# Patient Record
Sex: Female | Born: 1975 | Race: White | Hispanic: No | Smoking: Never smoker
Health system: Southern US, Community
[De-identification: ages and names within clinical notes are randomized; demographics above are authoritative.]

## PROBLEM LIST (undated history)

## (undated) DIAGNOSIS — N201 Calculus of ureter: Secondary | ICD-10-CM

## (undated) DIAGNOSIS — E785 Hyperlipidemia, unspecified: Secondary | ICD-10-CM

## (undated) DIAGNOSIS — R87629 Unspecified abnormal cytological findings in specimens from vagina: Secondary | ICD-10-CM

## (undated) DIAGNOSIS — G43909 Migraine, unspecified, not intractable, without status migrainosus: Secondary | ICD-10-CM

## (undated) DIAGNOSIS — I10 Essential (primary) hypertension: Secondary | ICD-10-CM

## (undated) HISTORY — DX: Hyperlipidemia, unspecified: E78.5

## (undated) HISTORY — DX: Migraine, unspecified, not intractable, without status migrainosus: G43.909

## (undated) HISTORY — DX: Essential (primary) hypertension: I10

## (undated) HISTORY — DX: Calculus of ureter: N20.1

## (undated) HISTORY — DX: Unspecified abnormal cytological findings in specimens from vagina: R87.629

---

## 2000-09-12 ENCOUNTER — Other Ambulatory Visit: Admission: RE | Admit: 2000-09-12 | Discharge: 2000-09-12 | Payer: Self-pay | Admitting: Family Medicine

## 2004-09-15 ENCOUNTER — Observation Stay: Payer: Self-pay | Admitting: Obstetrics and Gynecology

## 2004-09-21 ENCOUNTER — Inpatient Hospital Stay: Payer: Self-pay

## 2006-09-30 ENCOUNTER — Emergency Department: Payer: Self-pay | Admitting: Emergency Medicine

## 2007-04-21 ENCOUNTER — Ambulatory Visit: Payer: Self-pay | Admitting: Family Medicine

## 2007-12-17 ENCOUNTER — Ambulatory Visit: Payer: Self-pay

## 2008-06-01 ENCOUNTER — Ambulatory Visit: Payer: Self-pay | Admitting: General Practice

## 2008-06-03 ENCOUNTER — Ambulatory Visit: Payer: Self-pay | Admitting: Family Medicine

## 2008-07-06 ENCOUNTER — Encounter: Payer: Self-pay | Admitting: General Practice

## 2008-07-24 ENCOUNTER — Encounter: Payer: Self-pay | Admitting: General Practice

## 2008-09-08 ENCOUNTER — Other Ambulatory Visit: Payer: Self-pay | Admitting: Family Medicine

## 2009-03-26 HISTORY — PX: TONSILLECTOMY: SUR1361

## 2010-03-27 ENCOUNTER — Ambulatory Visit: Payer: Self-pay | Admitting: Internal Medicine

## 2010-05-04 ENCOUNTER — Ambulatory Visit: Payer: Self-pay | Admitting: Otolaryngology

## 2010-05-05 LAB — PATHOLOGY REPORT

## 2010-09-05 ENCOUNTER — Other Ambulatory Visit: Payer: Self-pay | Admitting: Family Medicine

## 2010-09-29 ENCOUNTER — Ambulatory Visit: Payer: Self-pay | Admitting: Cardiology

## 2012-04-03 ENCOUNTER — Ambulatory Visit: Payer: Self-pay

## 2014-02-16 LAB — LIPID PANEL
CHOLESTEROL: 237 mg/dL — AB (ref 0–200)
HDL: 57 mg/dL (ref 35–70)
LDL Cholesterol: 166 mg/dL
TRIGLYCERIDES: 71 mg/dL (ref 40–160)

## 2014-02-16 LAB — HEMOGLOBIN A1C: Hemoglobin A1C: 5.8

## 2014-02-16 LAB — TSH: TSH: 0.8 u[IU]/mL (ref <=5.90)

## 2014-04-28 ENCOUNTER — Ambulatory Visit: Payer: Self-pay | Admitting: Obstetrics and Gynecology

## 2014-05-24 ENCOUNTER — Encounter: Payer: Self-pay | Admitting: General Practice

## 2014-05-25 ENCOUNTER — Encounter
Admit: 2014-05-25 | Disposition: A | Discharge status: Home / Self Care | Payer: Self-pay | Attending: General Practice | Admitting: General Practice

## 2014-06-25 ENCOUNTER — Encounter
Admit: 2014-06-25 | Disposition: A | Discharge status: Home / Self Care | Payer: Self-pay | Attending: General Practice | Admitting: General Practice

## 2015-02-09 ENCOUNTER — Encounter: Payer: Self-pay | Admitting: *Deleted

## 2015-02-22 ENCOUNTER — Encounter: Payer: 59 | Admitting: Obstetrics and Gynecology

## 2015-03-22 ENCOUNTER — Encounter: Payer: Self-pay | Admitting: Obstetrics and Gynecology

## 2015-03-22 ENCOUNTER — Ambulatory Visit (INDEPENDENT_AMBULATORY_CARE_PROVIDER_SITE_OTHER): Payer: 59 | Admitting: Obstetrics and Gynecology

## 2015-03-22 VITALS — BP 140/80 | HR 74 | Ht 66.0 in | Wt 167.3 lb

## 2015-03-22 DIAGNOSIS — Z01419 Encounter for gynecological examination (general) (routine) without abnormal findings: Secondary | ICD-10-CM

## 2015-03-22 MED ORDER — NORETHIN ACE-ETH ESTRAD-FE 1.5-30 MG-MCG PO TABS: 1.0000 | ORAL_TABLET | Freq: Every day | ORAL | Status: DC

## 2015-03-22 NOTE — Patient Instructions (Signed)
  Place annual gynecologic exam patient instructions here.  Thank you for enrolling in [facility]. Please follow the instructions below to securely access your online medical record. MyChart allows you to send messages to your doctor, view your test results, manage appointments, and more.   How Do I Sign Up? 1. In your Internet browser, go to [employer] and enter [email or web address]. 2. Click on the Sign Up Now link in the Sign In box. You will see the New Member Sign Up page. 3. Enter your MyChart Access Code exactly as it appears below. You will not need to use this code after you've completed the sign-up process. If you do not sign up before the expiration date, you must request a new code.  MyChart Access Code: [email or web address] Expires: 04/23/2015 10:09 AM  4. Enter your Social Security Number ([id number]) and Date of Birth (mm/dd/yyyy) as indicated and click Submit. You will be taken to the next sign-up page. 5. Create a MyChart ID. This will be your MyChart login ID and cannot be changed, so think of one that is secure and easy to remember. 6. Create a MyChart password. You can change your password at any time. 7. Enter your Password Reset Question and Answer. This can be used at a later time if you forget your password.  8. Enter your e-mail address. You will receive e-mail notification when new information is available in [facility]. 9. Click Sign Up. You can now view your medical record.   Additional Information Remember, MyChart is NOT to be used for urgent needs. For medical emergencies, dial 911.

## 2015-03-22 NOTE — Progress Notes (Signed)
  Subjective:     Anna Kim is a 39 y.o. female and is here for a comprehensive physical exam. The patient reports no problems.  Social History   Social History  . Marital Status: Divorced    Spouse Name: N/A  . Number of Children: N/A  . Years of Education: N/A   Occupational History  . Not on file.   Social History Main Topics  . Smoking status: Never Smoker   . Smokeless tobacco: Never Used  . Alcohol Use: Yes     Comment: occas  . Drug Use: No  . Sexual Activity: Not Currently   Other Topics Concern  . Not on file   Social History Narrative   Health Maintenance  Topic Date Due  . HIV Screening  02/09/1989  . TETANUS/TDAP  02/09/1993  . PAP SMEAR  02/10/1995  . INFLUENZA VACCINE  10/25/2014    The following portions of the patient's history were reviewed and updated as appropriate: allergies, current medications, past family history, past medical history, past social history, past surgical history and problem list.  Review of Systems A comprehensive review of systems was negative.   Objective:    General appearance: alert, cooperative and appears stated age Neck: no adenopathy, no carotid bruit, no JVD, supple, symmetrical, trachea midline and thyroid not enlarged, symmetric, no tenderness/mass/nodules Lungs: clear to auscultation bilaterally Breasts: normal appearance, no masses or tenderness Heart: regular rate and rhythm, S1, S2 normal, no murmur, click, rub or gallop Abdomen: soft, non-tender; bowel sounds normal; no masses,  no organomegaly Pelvic: cervix normal in appearance, external genitalia normal, no adnexal masses or tenderness, no cervical motion tenderness, rectovaginal septum normal, uterus normal size, shape, and consistency and vagina normal without discharge    Assessment:    Healthy female exam. OCP user; Overweight      Plan:  Labs obtained and MMG ordered.   See After Visit Summary for Counseling Recommendations

## 2015-03-23 ENCOUNTER — Other Ambulatory Visit: Payer: Self-pay | Admitting: Obstetrics and Gynecology

## 2015-03-23 DIAGNOSIS — E559 Vitamin D deficiency, unspecified: Secondary | ICD-10-CM

## 2015-03-23 LAB — COMPREHENSIVE METABOLIC PANEL
ALK PHOS: 69 IU/L (ref 39–117)
ALT: 9 IU/L (ref 0–32)
AST: 10 IU/L (ref 0–40)
Albumin/Globulin Ratio: 1.5 (ref 1.1–2.5)
Albumin: 4.2 g/dL (ref 3.5–5.5)
BUN/Creatinine Ratio: 13 (ref 9–23)
BUN: 9 mg/dL (ref 6–24)
Bilirubin Total: 0.5 mg/dL (ref 0.0–1.2)
CHLORIDE: 100 mmol/L (ref 96–106)
CO2: 22 mmol/L (ref 18–29)
CREATININE: 0.72 mg/dL (ref 0.57–1.00)
Calcium: 8.6 mg/dL — ABNORMAL LOW (ref 8.7–10.2)
GFR calc Af Amer: 120 mL/min/{1.73_m2} (ref >59)
GFR calc non Af Amer: 104 mL/min/{1.73_m2} (ref >59)
Globulin, Total: 2.8 g/dL (ref 1.5–4.5)
Glucose: 90 mg/dL (ref 65–99)
Potassium: 3.7 mmol/L (ref 3.5–5.2)
Sodium: 139 mmol/L (ref 134–144)
Total Protein: 7 g/dL (ref 6.0–8.5)

## 2015-03-23 LAB — VITAMIN D 25 HYDROXY (VIT D DEFICIENCY, FRACTURES): Vit D, 25-Hydroxy: 20.6 ng/mL — ABNORMAL LOW (ref 30.0–100.0)

## 2015-03-23 MED ORDER — VITAMIN D (ERGOCALCIFEROL) 1.25 MG (50000 UNIT) PO CAPS: 50000.0000 [IU] | ORAL_CAPSULE | ORAL | Status: DC

## 2015-03-24 ENCOUNTER — Telehealth: Payer: Self-pay | Admitting: *Deleted

## 2015-03-24 NOTE — Telephone Encounter (Signed)
-----   Message from Joylene Igo, [place] sent at 03/23/2015  4:03 PM EST ----- Please let her know labs were normal except her vit D is too low, please mail info on vit D def, and let her know I sent in weekly supplement RX. I want to recheck her levels in 3 months.

## 2015-03-24 NOTE — Telephone Encounter (Signed)
Notified pt of results she voiced understanding

## 2015-05-17 ENCOUNTER — Other Ambulatory Visit: Payer: Self-pay | Admitting: Obstetrics and Gynecology

## 2015-05-17 ENCOUNTER — Ambulatory Visit
Admission: RE | Admit: 2015-05-17 | Discharge: 2015-05-17 | Disposition: A | Discharge status: Home / Self Care | Payer: 59 | Source: Ambulatory Visit | Attending: Obstetrics and Gynecology | Admitting: Obstetrics and Gynecology

## 2015-05-17 DIAGNOSIS — Z1231 Encounter for screening mammogram for malignant neoplasm of breast: Secondary | ICD-10-CM

## 2015-05-17 DIAGNOSIS — Z01419 Encounter for gynecological examination (general) (routine) without abnormal findings: Secondary | ICD-10-CM

## 2015-05-18 ENCOUNTER — Ambulatory Visit: Payer: Self-pay

## 2015-06-06 ENCOUNTER — Telehealth: Payer: Self-pay | Admitting: Obstetrics and Gynecology

## 2015-06-06 NOTE — Telephone Encounter (Signed)
PT HAS ALLERGIES AND WANTED TO KNOW IF YOU WOULD SEND IN A RX FOR HER FOR THE CLARITIN D 24 HOUR TO OUR [facility]OYEE [facility], SHE WENT TO THE EMPLOYEE CLINIC LAST YEAR AND THEY ONLY GAVE IT TO HR FOR 30 DAYS AND IT WAS GREAT, BUT IF SHE GETS IT OVER THE COUNTER SHE CAN ONLY GET IT FOR A WEEK AT A TIME SO SHE WAS WANTING TO SEE IF YOU CAN CALL IT IN SO SHE CAN GET A 30 DAY SUPPLY, SHE SAID SHE WOULD NEED TO TAKE IT FOR LIKE 3 MONTHS.

## 2015-06-07 ENCOUNTER — Other Ambulatory Visit: Payer: Self-pay | Admitting: Obstetrics and Gynecology

## 2015-06-07 MED ORDER — LORATADINE-PSEUDOEPHEDRINE ER 10-240 MG PO TB24: 1.0000 | ORAL_TABLET | Freq: Every day | ORAL | Status: DC

## 2015-06-07 NOTE — Telephone Encounter (Signed)
Left message rx sent to [facility]

## 2015-06-07 NOTE — Telephone Encounter (Signed)
Please let her know I sent it in today

## 2015-07-20 ENCOUNTER — Encounter: Payer: Self-pay | Admitting: Physician Assistant

## 2015-07-20 ENCOUNTER — Ambulatory Visit: Payer: Self-pay | Admitting: Physician Assistant

## 2015-07-20 VITALS — BP 120/90 | HR 80 | Temp 98.5°F

## 2015-07-20 DIAGNOSIS — J018 Other acute sinusitis: Secondary | ICD-10-CM

## 2015-07-20 MED ORDER — FLUCONAZOLE 150 MG PO TABS: 150.0000 mg | ORAL_TABLET | Freq: Once | ORAL | Status: DC

## 2015-07-20 MED ORDER — FLUTICASONE PROPIONATE 50 MCG/ACT NA SUSP: 2.0000 | Freq: Every day | NASAL | Status: DC

## 2015-07-20 MED ORDER — AMOXICILLIN 875 MG PO TABS: 875.0000 mg | ORAL_TABLET | Freq: Two times a day (BID) | ORAL | Status: DC

## 2015-07-20 NOTE — Progress Notes (Signed)
S: C/o runny nose and congestion for 3 days, no fever, chills, cp/sob, v/d; mucus is green and thick, cough is sporadic, c/o of facial and dental pain.   Using otc meds:   O: PE: perrl eomi, normocephalic, tms dull, nasal mucosa red and swollen, throat injected, neck supple no lymph, lungs c t a, cv rrr, neuro intact  A:  Acute sinusitis   P: amoxil, flonase, diflucan if needed; drink fluids, continue regular meds , use otc meds of choice, return if not improving in 5 days, return earlier if worsening

## 2015-08-08 DIAGNOSIS — S0500XA Injury of conjunctiva and corneal abrasion without foreign body, unspecified eye, initial encounter: Secondary | ICD-10-CM | POA: Diagnosis not present

## 2015-09-05 ENCOUNTER — Ambulatory Visit: Payer: Self-pay | Admitting: Physician Assistant

## 2015-09-05 ENCOUNTER — Telehealth: Payer: Self-pay | Admitting: Obstetrics and Gynecology

## 2015-09-05 ENCOUNTER — Encounter: Payer: Self-pay | Admitting: Physician Assistant

## 2015-09-05 ENCOUNTER — Encounter: Payer: Self-pay | Admitting: Obstetrics and Gynecology

## 2015-09-05 VITALS — BP 140/92 | HR 82 | Temp 98.6°F

## 2015-09-05 DIAGNOSIS — R300 Dysuria: Secondary | ICD-10-CM

## 2015-09-05 DIAGNOSIS — R102 Pelvic and perineal pain: Secondary | ICD-10-CM

## 2015-09-05 LAB — POCT URINALYSIS DIPSTICK
Bilirubin, UA: NEGATIVE
GLUCOSE UA: NEGATIVE
Ketones, UA: NEGATIVE
Leukocytes, UA: NEGATIVE
NITRITE UA: NEGATIVE
Protein, UA: NEGATIVE
Spec Grav, UA: 1.01
UROBILINOGEN UA: 0.2
pH, UA: 5.5

## 2015-09-05 NOTE — Progress Notes (Signed)
S: c/o pelvic pressure, not really painful, just makes her feel like she has to urinate, no fever/chills/v/d, no vag discharge or bleeding, states according to her pill pack she would probably be midcycle, sx for 1 days  O: vitals wnl, nad, abd soft nontender bs normal all 4 quads, n/v intact, ua 2+ blood  A: pelvic pressure/pain  P: f/u with gyn, does not exhibit sx of kidney stone

## 2015-09-05 NOTE — Telephone Encounter (Signed)
Pt is having a lot of pelvic pain and pressure. She went to [facility] clinic anf they did a urine test which came back neg but a lil blood in her urine. They suggested to her to come to Gyn to check her. She wants to see if she can get fitted in tomorrow before Mel leaves?

## 2015-09-05 NOTE — Telephone Encounter (Signed)
Pt to come in at 8:00 for pelvic pain.

## 2015-09-06 ENCOUNTER — Ambulatory Visit (INDEPENDENT_AMBULATORY_CARE_PROVIDER_SITE_OTHER): Payer: 59 | Admitting: Obstetrics and Gynecology

## 2015-09-06 ENCOUNTER — Encounter: Payer: Self-pay | Admitting: Obstetrics and Gynecology

## 2015-09-06 VITALS — BP 148/93 | HR 82 | Ht 66.0 in | Wt 172.4 lb

## 2015-09-06 DIAGNOSIS — R102 Pelvic and perineal pain: Secondary | ICD-10-CM

## 2015-09-06 LAB — POCT URINALYSIS DIPSTICK
BILIRUBIN UA: NEGATIVE
Glucose, UA: NEGATIVE
KETONES UA: NEGATIVE
Leukocytes, UA: NEGATIVE
Nitrite, UA: NEGATIVE
PH UA: 6
Spec Grav, UA: 1.02
Urobilinogen, UA: 0.2

## 2015-09-06 NOTE — Progress Notes (Signed)
Subjective:     Patient ID: Anna Kim, female   DOB: Jul 22, 1975, 40 y.o.   MRN: [id number]  HPI Reports sudden onset lower pelvic and bladder pain x 1 week, seen at outpatient clinic and was told not urinary and to follow up her. Not sexually active at this time. Denies any bowel changes or menstrual changes, and is taking OCPs as directed.  Review of Systems  Constitutional: Negative.   HENT: Negative.   Gastrointestinal: Negative.   Genitourinary: Positive for urgency and pelvic pain.  Musculoskeletal: Positive for back pain.  Skin: Negative.   Psychiatric/Behavioral: Negative.        Objective:   Physical Exam A&O x4  well groomed female Danley Danker Vitals:   09/06/15 0805  Height: 5\' 6"  (1.676 m)  Weight: 172 lb 6.4 oz (78.2 kg)  abdomen soft and non-tender Pelvic exam: normal external genitalia, vulva, vagina, cervix, uterus and adnexa. Urinalysis    Component Value Date/Time   BILIRUBINUR NEG 09/06/2015 0911   PROTEINUR TRACE 09/06/2015 0911   UROBILINOGEN 0.2 09/06/2015 0911   NITRITE NEG 09/06/2015 0911   LEUKOCYTESUR Negative 09/06/2015 0911        Assessment:     Pelvic pain of unknown etiology     Plan:     Urine sent for culture To continue current OCPs  counseled on causes of pelvic pain and to notify us if pain worsens.  RTC as needed.  Lorelle Gibbs, 'CNM

## 2015-09-07 LAB — URINE CULTURE

## 2015-12-27 ENCOUNTER — Telehealth: Payer: Self-pay | Admitting: Obstetrics and Gynecology

## 2015-12-27 NOTE — Telephone Encounter (Signed)
pls advise

## 2015-12-27 NOTE — Telephone Encounter (Signed)
PT CALLED AND MNS TOLD HER TO KEEP AN EYE ON HER BP AND SHE HAS CHECKED IT OVER THE PAST FEW DAYS AND IT  HAS BEEN HIGH, LAST NIGHT IT WAS 157/105, SHE WAS HAVING PRESSURE BEHIND HER HEAD, SHE WASN'T FEELING RIGHT THAT'S WHY SHE CHECKED IT, SHE CHECKED IT,  THIS MORNING AND IT WAS 132/97. SO SHE ISN'T SURE IF SHE NEEDS TO COME IN AND HAVE MNS CHECK HER OR COME IN FOR A BP CHECK. SHE CHECKED IT 10/1 AND IT 124/68. PT WOULD LIKE A CALL BACK. CAN LEAVE A MESSAGE IF SHE DOES NOT ANSWER.

## 2015-12-27 NOTE — Telephone Encounter (Signed)
Recommend she be evaluated at employee clinic or PCP.

## 2015-12-28 ENCOUNTER — Encounter: Payer: Self-pay | Admitting: Physician Assistant

## 2015-12-28 ENCOUNTER — Ambulatory Visit: Payer: Self-pay | Admitting: Physician Assistant

## 2015-12-28 VITALS — BP 120/80 | HR 76 | Temp 98.7°F

## 2015-12-28 DIAGNOSIS — R030 Elevated blood-pressure reading, without diagnosis of hypertension: Secondary | ICD-10-CM

## 2015-12-28 MED ORDER — HYDROCHLOROTHIAZIDE 25 MG PO TABS: 25.0000 mg | ORAL_TABLET | Freq: Every day | ORAL | 3 refills | Status: DC

## 2015-12-28 NOTE — Progress Notes (Signed)
S: c/o elevated bp, states she usually runs 90/over something, has been running higher, even had one as high as 150/107, she didn't feel well when she took that reading, has several readings that are much higher than her norm, family hx + htn; states has been taking claritin-d reagularly but recently stopped 1 week ago Tried to see a pcp but cannot be seen until end of October  O:vitals wnl, bp at 120/80, lungs c t a, cv rrr, no bruits noted at carotids  A: elevated bp  P: continue to take bp, if still decreasing since stopping claritin-d then do not need medication, if bp still going up then use hctz 25mg  qd until seen by a pcp; return if any issues or concerns

## 2016-01-20 ENCOUNTER — Ambulatory Visit (INDEPENDENT_AMBULATORY_CARE_PROVIDER_SITE_OTHER): Payer: 59 | Admitting: Family Medicine

## 2016-01-20 ENCOUNTER — Encounter: Payer: Self-pay | Admitting: Family Medicine

## 2016-01-20 VITALS — BP 124/88 | HR 71 | Temp 98.2°F | Ht 66.5 in | Wt 171.4 lb

## 2016-01-20 DIAGNOSIS — R7303 Prediabetes: Secondary | ICD-10-CM | POA: Diagnosis not present

## 2016-01-20 DIAGNOSIS — R030 Elevated blood-pressure reading, without diagnosis of hypertension: Secondary | ICD-10-CM

## 2016-01-20 DIAGNOSIS — E785 Hyperlipidemia, unspecified: Secondary | ICD-10-CM | POA: Diagnosis not present

## 2016-01-20 DIAGNOSIS — Z130 Encounter for screening for diseases of the blood and blood-forming organs and certain disorders involving the immune mechanism: Secondary | ICD-10-CM | POA: Diagnosis not present

## 2016-01-20 DIAGNOSIS — Z8679 Personal history of other diseases of the circulatory system: Secondary | ICD-10-CM

## 2016-01-20 HISTORY — DX: Personal history of other diseases of the circulatory system: Z86.79

## 2016-01-20 LAB — CBC
HEMATOCRIT: 36.1 % (ref 36.0–46.0)
HEMOGLOBIN: 12 g/dL (ref 12.0–15.0)
MCHC: 33.2 g/dL (ref 30.0–36.0)
MCV: 93 fl (ref 78.0–100.0)
Platelets: 174 10*3/uL (ref 150.0–400.0)
RBC: 3.88 Mil/uL (ref 3.87–5.11)
RDW: 12.7 % (ref 11.5–15.5)
WBC: 6.8 10*3/uL (ref 4.0–10.5)

## 2016-01-20 LAB — COMPREHENSIVE METABOLIC PANEL
ALBUMIN: 4 g/dL (ref 3.5–5.2)
ALK PHOS: 52 U/L (ref 39–117)
ALT: 8 U/L (ref 0–35)
AST: 12 U/L (ref 0–37)
BILIRUBIN TOTAL: 0.6 mg/dL (ref 0.2–1.2)
BUN: 7 mg/dL (ref 6–23)
CO2: 29 mEq/L (ref 19–32)
Calcium: 9.3 mg/dL (ref 8.4–10.5)
Chloride: 104 mEq/L (ref 96–112)
Creatinine, Ser: 0.85 mg/dL (ref 0.40–1.20)
GFR: 77.98 mL/min (ref >60.00)
Glucose, Bld: 100 mg/dL — ABNORMAL HIGH (ref 70–99)
POTASSIUM: 4.1 meq/L (ref 3.5–5.1)
SODIUM: 138 meq/L (ref 135–145)
TOTAL PROTEIN: 7.2 g/dL (ref 6.0–8.3)

## 2016-01-20 LAB — LIPID PANEL
CHOLESTEROL: 186 mg/dL (ref 0–200)
HDL: 45.3 mg/dL (ref >39.00)
LDL Cholesterol: 127 mg/dL — ABNORMAL HIGH (ref 0–99)
NonHDL: 140.91
Total CHOL/HDL Ratio: 4
Triglycerides: 69 mg/dL (ref 0.0–149.0)
VLDL: 13.8 mg/dL (ref 0.0–40.0)

## 2016-01-20 LAB — HEMOGLOBIN A1C: HEMOGLOBIN A1C: 5.7 % (ref 4.6–6.5)

## 2016-01-20 NOTE — Progress Notes (Signed)
Pre visit review using our clinic review tool, if applicable. No additional management support is needed unless otherwise documented below in the visit note. 

## 2016-01-20 NOTE — Assessment & Plan Note (Addendum)
New problem. Noted on review of the EMR. Diet and exercise. A1C today.

## 2016-01-20 NOTE — Assessment & Plan Note (Addendum)
New problem. Noted on review of the EMR. Lipid panel today.

## 2016-01-20 NOTE — Progress Notes (Signed)
Subjective:  Patient ID: Anna Kim, female    DOB: 02/08/1976  Age: 40 y.o. MRN: [id number]  CC: Establish care, Elevated BP  HPI Anna Kim is a 40 y.o. female presents to the clinic today to establish care. Primary concern is Elevated BP. Additional concerns are below.    Elevated BP  Patient has had elevated blood pressure on several occasions over the past few months.  It is occurred at her OB/GYN's office as well as the dentist.  Her pressures have been mildly elevated at home, particularly the diastolic.  She states that several of these incidences have occurred when she was taking decongestants or NSAIDs. She thought this may be the culprit for her elevated blood pressures.  However, since being off, her blood pressures continued to be on the high side.  She presents today as a new patient to discuss this.  No associated symptoms.  No other complaints or issues at this time.  PMH, Surgical Hx, Family Hx, Social History reviewed and updated as below.  Past Medical History:  Diagnosis Date  . Migraine   . Vaginal Pap smear, abnormal    Remote history of 1 abnormal pap   Past Surgical History:  Procedure Laterality Date  . TONSILLECTOMY  2011   Family History  Problem Relation Age of Onset  . Cancer Maternal Aunt     ovarian  . Hypertension Mother   . Hypertension Father    Social History  Substance Use Topics  . Smoking status: Never Smoker  . Smokeless tobacco: Never Used  . Alcohol use Yes     Comment: occas    Review of Systems  Cardiovascular:       Elevated BP.  All other systems reviewed and are negative.  Objective:   Today's Vitals: BP 124/88 (BP Location: Right Arm, Patient Position: Sitting, Cuff Size: Normal)   Pulse 71   Temp 98.2 F (36.8 C) (Oral)   Ht 5' 6.5" (1.689 m)   Wt 171 lb 6 oz (77.7 kg)   SpO2 99%   BMI 27.25 kg/m   Physical Exam  Constitutional: She is oriented to person, place, and time. She appears  well-developed and well-nourished. No distress.  HENT:  Head: Normocephalic and atraumatic.  Nose: Nose normal.  Mouth/Throat: Oropharynx is clear and moist. No oropharyngeal exudate.  Normal TM's bilaterally.   Eyes: Conjunctivae are normal. No scleral icterus.  Neck: Neck supple. No thyromegaly present.  Cardiovascular: Normal rate and regular rhythm.   No murmur heard. Pulmonary/Chest: Effort normal and breath sounds normal. She has no wheezes. She has no rales.  Abdominal: Soft. She exhibits no distension. There is no tenderness. There is no rebound and no guarding.  Musculoskeletal: Normal range of motion. She exhibits no edema.  Lymphadenopathy:    She has no cervical adenopathy.  Neurological: She is alert and oriented to person, place, and time.  Skin: Skin is warm and dry. No rash noted.  Psychiatric: She has a normal mood and affect.  Vitals reviewed.  Assessment & Plan:   Problem List Items Addressed This Visit    Prediabetes    New problem. Noted on review of the EMR. Diet and exercise. A1C today.       Relevant Orders   HgB A1c   Hyperlipidemia    New problem. Noted on review of the EMR. Lipid panel today.      Relevant Orders   Lipid Profile   Elevated BP without diagnosis of  hypertension    New problem. BP mildly elevated initially at 132/94. Repeat was 124/88. It is unclear at this time whether patient truly has hypertension or not. Obtaining laboratory studies today. Advised her to take her blood pressure daily at home when she is calm and after period of rest. I advised her to use an appropriate size cuff. No pharmacotherapy this time. Patient will follow-up in 2 weeks.  Advised low salt diet and exercise as well.       Relevant Orders   Comp Met (CMET)   TSH    Other Visit Diagnoses    Screening for deficiency anemia       Relevant Orders   CBC      Outpatient Encounter Prescriptions as of 01/20/2016  Medication Sig  .  norethindrone-ethinyl estradiol-iron (MICROGESTIN FE,GILDESS FE,LOESTRIN FE) 1.5-30 MG-MCG tablet Take 1 tablet by mouth daily.  . Vitamin D, Ergocalciferol, (DRISDOL) 50000 units CAPS capsule Take 1 capsule (50,000 Units total) by mouth every 7 (seven) days.  . [DISCONTINUED] fluticasone (FLONASE) 50 MCG/ACT nasal spray Place 2 sprays into both nostrils daily.  . [DISCONTINUED] hydrochlorothiazide (HYDRODIURIL) 25 MG tablet Take 1 tablet (25 mg total) by mouth daily. (Patient not taking: Reported on 01/20/2016)   No facility-administered encounter medications on file as of 01/20/2016.     Follow-up: Return in about 2 weeks (around 02/03/2016).  [facility]

## 2016-01-20 NOTE — Patient Instructions (Signed)
We will call with your lab results.  Follow up in 2 weeks.  Keep a log of your BP's.  Take care  Dr. Lacinda Axon

## 2016-01-20 NOTE — Assessment & Plan Note (Addendum)
New problem. BP mildly elevated initially at 132/94. Repeat was 124/88. It is unclear at this time whether patient truly has hypertension or not. Obtaining laboratory studies today. Advised her to take her blood pressure daily at home when she is calm and after period of rest. I advised her to use an appropriate size cuff. No pharmacotherapy this time. Patient will follow-up in 2 weeks.  Advised low salt diet and exercise as well.

## 2016-01-23 LAB — TSH: TSH: 0.68 u[IU]/mL (ref 0.35–4.50)

## 2016-02-09 ENCOUNTER — Ambulatory Visit (INDEPENDENT_AMBULATORY_CARE_PROVIDER_SITE_OTHER): Payer: 59 | Admitting: Family Medicine

## 2016-02-09 ENCOUNTER — Encounter: Payer: Self-pay | Admitting: Family Medicine

## 2016-02-09 DIAGNOSIS — I10 Essential (primary) hypertension: Secondary | ICD-10-CM

## 2016-02-09 NOTE — Assessment & Plan Note (Signed)
New diagnosis. Meets criteria for diagnosis of stage 1 hypertension per new ACC/AHA guidelines. Proceeding with lifestyle changes - exercise, dash diet/low sodium. Follow up in 3 months.

## 2016-02-09 NOTE — Progress Notes (Signed)
Pre visit review using our clinic review tool, if applicable. No additional management support is needed unless otherwise documented below in the visit note. 

## 2016-02-09 NOTE — Patient Instructions (Signed)
Lifestyle changes as we discussed.   Follow up in 3 months.  Dr. Lacinda Axon

## 2016-02-09 NOTE — Progress Notes (Signed)
   Subjective:  Patient ID: Anna Kim, female    DOB: 1975-08-11  Age: 40 y.o. MRN: [id number]  CC: Follow up BP  HPI:  40 year old female presents for follow-up regarding her blood pressure.  Elevated BP  Patient presents today for follow-up regarding her blood pressure.  She has taken her home readings. Several of them have been in the [id number] systolic. Additionally, she's had several diastolic readings greater than 80.  No current exercise.  She states that she eats fairly healthy. No fatty foods or fried foods.  No other associated symptoms.  No other complaints or issues at this time.  Will discuss today.  Social Hx   Social History   Social History  . Marital status: Divorced    Spouse name: N/A  . Number of children: N/A  . Years of education: N/A   Social History Main Topics  . Smoking status: Never Smoker  . Smokeless tobacco: Never Used  . Alcohol use Yes     Comment: occas  . Drug use: No  . Sexual activity: Not Currently   Other Topics Concern  . None   Social History Narrative  . None   Review of Systems  Constitutional: Negative.   Cardiovascular:       Elevated BP.    Objective:  BP 124/82 (BP Location: Left Arm, Patient Position: Sitting, Cuff Size: Normal)   Pulse 68   Temp 98.1 F (36.7 C) (Oral)   Resp 12   Wt 172 lb (78 kg)   SpO2 98%   BMI 27.35 kg/m   BP/Weight 02/09/2016 01/20/2016 [id number]  Systolic BP A999333 [id number]  Diastolic BP 82 88 80  Wt. (Lbs) 172 171.38 -  BMI 27.35 27.25 -   Physical Exam  Constitutional: She is oriented to person, place, and time. She appears well-developed. No distress.  Cardiovascular: Normal rate and regular rhythm.   Pulmonary/Chest: Effort normal and breath sounds normal.  Neurological: She is alert and oriented to person, place, and time.  Psychiatric: She has a normal mood and affect.  Vitals reviewed.  Lab Results  Component Value Date   WBC 6.8 01/20/2016   HGB 12.0  01/20/2016   HCT 36.1 01/20/2016   PLT 174.0 01/20/2016   GLUCOSE 100 (H) 01/20/2016   CHOL 186 01/20/2016   TRIG 69.0 01/20/2016   HDL 45.30 01/20/2016   LDLCALC 127 (H) 01/20/2016   ALT 8 01/20/2016   AST 12 01/20/2016   NA 138 01/20/2016   K 4.1 01/20/2016   CL 104 01/20/2016   CREATININE 0.85 01/20/2016   BUN 7 01/20/2016   CO2 29 01/20/2016   TSH 0.68 01/20/2016   HGBA1C 5.7 01/20/2016   Assessment & Plan:   Problem List Items Addressed This Visit    Essential hypertension    New diagnosis. Meets criteria for diagnosis of stage 1 hypertension per new ACC/AHA guidelines. Proceeding with lifestyle changes - exercise, dash diet/low sodium. Follow up in 3 months.        Follow-up: 3 months.  [place]

## 2016-02-20 ENCOUNTER — Ambulatory Visit: Payer: Self-pay | Admitting: Family Medicine

## 2016-03-23 ENCOUNTER — Other Ambulatory Visit: Payer: Self-pay | Admitting: Obstetrics and Gynecology

## 2016-03-23 ENCOUNTER — Encounter: Payer: Self-pay | Admitting: Obstetrics and Gynecology

## 2016-03-23 ENCOUNTER — Ambulatory Visit (INDEPENDENT_AMBULATORY_CARE_PROVIDER_SITE_OTHER): Payer: 59 | Admitting: Obstetrics and Gynecology

## 2016-03-23 VITALS — BP 136/78 | HR 71 | Ht 66.0 in | Wt 169.1 lb

## 2016-03-23 DIAGNOSIS — Z01419 Encounter for gynecological examination (general) (routine) without abnormal findings: Secondary | ICD-10-CM

## 2016-03-23 MED ORDER — NORETHIN ACE-ETH ESTRAD-FE 1.5-30 MG-MCG PO TABS: 1.0000 | ORAL_TABLET | Freq: Every day | ORAL | 4 refills | Status: DC

## 2016-03-23 NOTE — Progress Notes (Signed)
Subjective:   Anna Kim is a 40 y.o. G46P0 Caucasian female here for a routine well-woman exam.  No LMP recorded. Patient is not currently having periods (Reason: Oral contraceptives).    Current complaints: none PCP: Lacinda Axon       doesn't desire labs  Social History: Sexual: heterosexual Marital Status: divorced Living situation: with daughter Occupation: radiology tech Tobacco/alcohol: no tobacco use Illicit drugs: no history of illicit drug use  The following portions of the patient's history were reviewed and updated as appropriate: allergies, current medications, past family history, past medical history, past social history, past surgical history and problem list.  Past Medical History Past Medical History:  Diagnosis Date  . Migraine   . Vaginal Pap smear, abnormal    Remote history of 1 abnormal pap    Past Surgical History Past Surgical History:  Procedure Laterality Date  . TONSILLECTOMY  2011    Gynecologic History G1P0  No LMP recorded. Patient is not currently having periods (Reason: Oral contraceptives). Contraception: OCP (estrogen/progesterone) Last Pap: 2015. Results were: normal Last mammogram: 04/2015. Results were: normal   Obstetric History OB History  Gravida Para Term Preterm AB Living  1         1  SAB TAB Ectopic Multiple Live Births          1    # Outcome Date GA Lbr Len/2nd Weight Sex Delivery Anes PTL Lv  1 Gravida 2006    F Vag-Spont   LIV      Current Medications Current Outpatient Prescriptions on File Prior to Visit  Medication Sig Dispense Refill  . norethindrone-ethinyl estradiol-iron (MICROGESTIN FE,GILDESS FE,LOESTRIN FE) 1.5-30 MG-MCG tablet Take 1 tablet by mouth daily. 3 Package 4  . Vitamin D, Ergocalciferol, (DRISDOL) 50000 units CAPS capsule Take 1 capsule (50,000 Units total) by mouth every 7 (seven) days. 30 capsule 1   No current facility-administered medications on file prior to visit.     Review of  Systems Patient denies any headaches, blurred vision, shortness of breath, chest pain, abdominal pain, problems with bowel movements, urination, or intercourse.  Objective:  BP 136/78   Pulse 71   Ht 5\' 6"  (1.676 m)   Wt 169 lb 1.6 oz (76.7 kg)   BMI 27.29 kg/m  Physical Exam  General:  Well developed, well nourished, no acute distress. She is alert and oriented x3. Skin:  Warm and dry Neck:  Midline trachea, no thyromegaly or nodules Cardiovascular: Regular rate and rhythm, no murmur heard Lungs:  Effort normal, all lung fields clear to auscultation bilaterally Breasts:  No dominant palpable mass, retraction, or nipple discharge Abdomen:  Soft, non tender, no hepatosplenomegaly or masses Pelvic:  External genitalia is normal in appearance.  The vagina is normal in appearance. The cervix is bulbous, no CMT.  Thin prep pap is done with HR HPV cotesting. Uterus is felt to be normal size, shape, and contour.  No adnexal masses or tenderness noted. Extremities:  No swelling or varicosities noted Psych:  She has a normal mood and affect  Assessment:   Healthy well-woman exam Overweight OCP user  Plan:  Pills refilled F/U 1 year for AE, or sooner if needed Mammogram ordered   Nikkia Devoss Rockney Ghee, CNM

## 2016-03-28 LAB — CYTOLOGY - PAP

## 2016-04-26 ENCOUNTER — Other Ambulatory Visit: Payer: Self-pay | Admitting: Family Medicine

## 2016-04-26 ENCOUNTER — Telehealth: Payer: Self-pay | Admitting: Family Medicine

## 2016-04-26 MED ORDER — HYDROCHLOROTHIAZIDE 12.5 MG PO CAPS: 12.5000 mg | ORAL_CAPSULE | Freq: Every day | ORAL | 3 refills | Status: DC

## 2016-04-26 NOTE — Telephone Encounter (Signed)
Patient advised.

## 2016-04-26 NOTE — Telephone Encounter (Signed)
She meets criteria for HTN. We can start medication.

## 2016-04-26 NOTE — Telephone Encounter (Signed)
Spoke with patient and she is willing to start medication for blood pressure .  Verified pharmacy.

## 2016-04-26 NOTE — Telephone Encounter (Signed)
Patient advised and verbalized understanding 

## 2016-04-26 NOTE — Telephone Encounter (Signed)
RN at work re checked BP and it was 135/93 when re-checked  Heart rate was 73.  Patient not  complaining of chest pain, no shortness of breath.  After laying down patient isn;t dizzy.   Would you be able to work patient in for appointment tomorrow or does she need to go to urgent care.  Please advise.

## 2016-04-26 NOTE — Telephone Encounter (Signed)
Needs her BP medication called into [facility].

## 2016-04-26 NOTE — Telephone Encounter (Signed)
Pt called and stated that she is at work and started feeling dizzy. They took her bp and it was 150/98. She has an appt on 2/16 for a follow up on these symptoms, she had this back in November. She is not sure as to what she should do. Please advise, thank you!  Call pt @ [phone]

## 2016-04-26 NOTE — Telephone Encounter (Signed)
Pt asked to be called once it is sent to the pharmacy. Thank you!  Call pt @ [phone]

## 2016-04-26 NOTE — Telephone Encounter (Signed)
Pt advised.

## 2016-04-26 NOTE — Telephone Encounter (Signed)
Pt stated that the pharmacy has not received Rx  Please call pt when this is sent over [phone]

## 2016-04-26 NOTE — Telephone Encounter (Signed)
Rx sent 

## 2016-05-11 ENCOUNTER — Ambulatory Visit (INDEPENDENT_AMBULATORY_CARE_PROVIDER_SITE_OTHER): Payer: 59 | Admitting: Family Medicine

## 2016-05-11 ENCOUNTER — Encounter: Payer: Self-pay | Admitting: Family Medicine

## 2016-05-11 DIAGNOSIS — I10 Essential (primary) hypertension: Secondary | ICD-10-CM | POA: Diagnosis not present

## 2016-05-11 NOTE — Patient Instructions (Signed)
Check your BP at home (regularly).  Let me know if its up >130/80 consistently.  Follow up in 6 months to 1 year.  Take care  Dr. Lacinda Axon

## 2016-05-11 NOTE — Progress Notes (Signed)
Pre visit review using our clinic review tool, if applicable. No additional management support is needed unless otherwise documented below in the visit note. 

## 2016-05-11 NOTE — Progress Notes (Signed)
   Subjective:  Patient ID: Anna Kim, female    DOB: 26-May-1975  Age: 41 y.o. MRN: [id number]  CC: Follow up HTN  HPI:  41 year old female presents for follow-up regarding hypertension.  Hypertension  BP's well controlled at home.  Compliant with HCTZ.  No side effects.  No other concerns.  Social Hx   Social History   Social History  . Marital status: Divorced    Spouse name: N/A  . Number of children: N/A  . Years of education: N/A   Social History Main Topics  . Smoking status: Never Smoker  . Smokeless tobacco: Never Used  . Alcohol use Yes     Comment: occas  . Drug use: No  . Sexual activity: Not Currently   Other Topics Concern  . None   Social History Narrative  . None   Review of Systems  Respiratory: Negative.   Cardiovascular: Negative.    Objective:  BP 130/84 (BP Location: Left Arm, Patient Position: Sitting, Cuff Size: Normal)   Pulse 92   Temp 98.2 F (36.8 C) (Oral)   Wt 166 lb (75.3 kg)   SpO2 96%   BMI 26.79 kg/m   BP/Weight 05/11/2016 03/23/2016 [id number]  Systolic BP [id number]  Diastolic BP 84 78 82  Wt. (Lbs) 166 169.1 172  BMI 26.79 27.29 27.35   Physical Exam  Constitutional: She is oriented to person, place, and time. She appears well-developed. No distress.  Cardiovascular: Normal rate and regular rhythm.   Pulmonary/Chest: Effort normal and breath sounds normal.  Neurological: She is alert and oriented to person, place, and time.  Psychiatric: She has a normal mood and affect.  Vitals reviewed.  Lab Results  Component Value Date   WBC 6.8 01/20/2016   HGB 12.0 01/20/2016   HCT 36.1 01/20/2016   PLT 174.0 01/20/2016   GLUCOSE 100 (H) 01/20/2016   CHOL 186 01/20/2016   TRIG 69.0 01/20/2016   HDL 45.30 01/20/2016   LDLCALC 127 (H) 01/20/2016   ALT 8 01/20/2016   AST 12 01/20/2016   NA 138 01/20/2016   K 4.1 01/20/2016   CL 104 01/20/2016   CREATININE 0.85 01/20/2016   BUN 7 01/20/2016   CO2 29  01/20/2016   TSH 0.68 01/20/2016   HGBA1C 5.7 01/20/2016    Assessment & Plan:   Problem List Items Addressed This Visit    Essential hypertension    Stable. Home readings well controlled. Continue HCTZ.        Follow-up: 6 months to 1 year  [place]

## 2016-05-11 NOTE — Assessment & Plan Note (Signed)
Stable. Home readings well controlled. Continue HCTZ.

## 2016-06-04 ENCOUNTER — Ambulatory Visit
Admission: RE | Admit: 2016-06-04 | Discharge: 2016-06-04 | Disposition: A | Discharge status: Home / Self Care | Payer: 59 | Source: Ambulatory Visit | Attending: Obstetrics and Gynecology | Admitting: Obstetrics and Gynecology

## 2016-06-04 DIAGNOSIS — Z1231 Encounter for screening mammogram for malignant neoplasm of breast: Secondary | ICD-10-CM | POA: Diagnosis not present

## 2016-06-04 DIAGNOSIS — Z01419 Encounter for gynecological examination (general) (routine) without abnormal findings: Secondary | ICD-10-CM

## 2016-07-04 DIAGNOSIS — H52213 Irregular astigmatism, bilateral: Secondary | ICD-10-CM | POA: Diagnosis not present

## 2016-11-08 ENCOUNTER — Encounter: Payer: Self-pay | Admitting: Family Medicine

## 2016-11-09 ENCOUNTER — Encounter: Payer: Self-pay | Admitting: Family Medicine

## 2016-11-16 ENCOUNTER — Ambulatory Visit (INDEPENDENT_AMBULATORY_CARE_PROVIDER_SITE_OTHER): Payer: 59 | Admitting: Family Medicine

## 2016-11-16 ENCOUNTER — Encounter: Payer: Self-pay | Admitting: Family Medicine

## 2016-11-16 VITALS — BP 118/84 | HR 74 | Temp 98.5°F | Resp 18 | Ht 66.5 in | Wt 172.5 lb

## 2016-11-16 DIAGNOSIS — R5383 Other fatigue: Secondary | ICD-10-CM | POA: Diagnosis not present

## 2016-11-16 DIAGNOSIS — E785 Hyperlipidemia, unspecified: Secondary | ICD-10-CM

## 2016-11-16 DIAGNOSIS — I10 Essential (primary) hypertension: Secondary | ICD-10-CM

## 2016-11-16 DIAGNOSIS — D229 Melanocytic nevi, unspecified: Secondary | ICD-10-CM | POA: Diagnosis not present

## 2016-11-16 DIAGNOSIS — L905 Scar conditions and fibrosis of skin: Secondary | ICD-10-CM | POA: Diagnosis not present

## 2016-11-16 DIAGNOSIS — Z0001 Encounter for general adult medical examination with abnormal findings: Secondary | ICD-10-CM | POA: Diagnosis not present

## 2016-11-16 DIAGNOSIS — D239 Other benign neoplasm of skin, unspecified: Secondary | ICD-10-CM | POA: Diagnosis not present

## 2016-11-16 LAB — CBC
HCT: 39.4 % (ref 35.0–45.0)
HEMOGLOBIN: 13.1 g/dL (ref 11.7–15.5)
MCH: 31.5 pg (ref 27.0–33.0)
MCHC: 33.2 g/dL (ref 32.0–36.0)
MCV: 94.7 fL (ref 80.0–100.0)
MPV: 12.5 fL (ref 7.5–12.5)
Platelets: 233 10*3/uL (ref 140–400)
RBC: 4.16 MIL/uL (ref 3.80–5.10)
RDW: 13.1 % (ref 11.0–15.0)
WBC: 7.5 10*3/uL (ref 3.8–10.8)

## 2016-11-16 MED ORDER — HYDROCHLOROTHIAZIDE 12.5 MG PO CAPS: 12.5000 mg | ORAL_CAPSULE | Freq: Every day | ORAL | 3 refills | Status: DC

## 2016-11-16 NOTE — Progress Notes (Signed)
Subjective:  Patient ID: Anna Kim, female    DOB: 1975-10-17  Age: 40 y.o. MRN: [id number]  CC: Annual exam  HPI Anna Kim is a 41 y.o. female presents to the clinic today for an annual physical exam.  Preventative Healthcare  Pap smear: Up to date.  Mammogram: Up to date.  Immunizations  Tetanus - In need of. Will get at work.   Labs: Needs labs today.  Alcohol use: Yes.   Smoking/tobacco use: No.  PMH, Surgical Hx, Family Hx, Social History reviewed and updated as below.  Past Medical History:  Diagnosis Date  . Migraine   . Vaginal Pap smear, abnormal    Remote history of 1 abnormal pap   Past Surgical History:  Procedure Laterality Date  . TONSILLECTOMY  2011   Family History  Problem Relation Age of Onset  . Cancer Maternal Aunt        ovarian  . Hypertension Mother   . Hypertension Father    Social History  Substance Use Topics  . Smoking status: Never Smoker  . Smokeless tobacco: Never Used  . Alcohol use Yes     Comment: occas    Review of Systems  Constitutional: Positive for fatigue.  All other systems reviewed and are negative.  Objective:   Today's Vitals: BP 118/84 (BP Location: Left Arm, Patient Position: Sitting, Cuff Size: Normal)   Pulse 74   Temp 98.5 F (36.9 C) (Oral)   Resp 18   Ht 5' 6.5" (1.689 m)   Wt 172 lb 8 oz (78.2 kg)   SpO2 98%   BMI 27.43 kg/m   Physical Exam  Constitutional: She is oriented to person, place, and time. She appears well-developed and well-nourished. No distress.  HENT:  Head: Normocephalic and atraumatic.  Nose: Nose normal.  Mouth/Throat: Oropharynx is clear and moist. No oropharyngeal exudate.  Normal TM's bilaterally.   Eyes: Conjunctivae are normal. No scleral icterus.  Neck: Neck supple.  Cardiovascular: Normal rate and regular rhythm.   No murmur heard. Pulmonary/Chest: Effort normal and breath sounds normal. She has no wheezes. She has no rales.  Abdominal: Soft. She  exhibits no distension. There is no tenderness. There is no rebound and no guarding.  Musculoskeletal: Normal range of motion. She exhibits no edema.  Lymphadenopathy:    She has no cervical adenopathy.  Neurological: She is alert and oriented to person, place, and time.  Skin: Skin is warm and dry. No rash noted.  Psychiatric:  Flat affect.  Vitals reviewed.  Assessment & Plan:   Problem List Items Addressed This Visit    Essential hypertension   Relevant Medications   hydrochlorothiazide (MICROZIDE) 12.5 MG capsule   Other Relevant Orders   Comprehensive metabolic panel   Hyperlipidemia   Relevant Medications   hydrochlorothiazide (MICROZIDE) 12.5 MG capsule   Other Relevant Orders   Lipid panel   Encounter for health maintenance examination with abnormal findings - Primary    Needs Tdap. Will get at work. Remainder of preventative healthcare up to date.  Experiencing fatigue. Labs today.       Other Visit Diagnoses    Other fatigue       Relevant Orders   CBC   TSH   Iron, TIBC and Ferritin Panel   Vitamin B12   Vitamin D (25 hydroxy)      Meds ordered this encounter  Medications  . hydrochlorothiazide (MICROZIDE) 12.5 MG capsule    Sig: Take 1 capsule (12.5  mg total) by mouth daily.    Dispense:  90 capsule    Refill:  3   Follow-up: Annually  [place]

## 2016-11-16 NOTE — Patient Instructions (Signed)

## 2016-11-16 NOTE — Assessment & Plan Note (Signed)
Needs Tdap. Will get at work. Remainder of preventative healthcare up to date.  Experiencing fatigue. Labs today.

## 2016-11-17 LAB — COMPREHENSIVE METABOLIC PANEL
ALBUMIN: 4.2 g/dL (ref 3.6–5.1)
ALT: 12 U/L (ref 6–29)
AST: 17 U/L (ref 10–30)
Alkaline Phosphatase: 58 U/L (ref 33–115)
BUN: 10 mg/dL (ref 7–25)
CHLORIDE: 101 mmol/L (ref 98–110)
CO2: 24 mmol/L (ref 20–32)
CREATININE: 0.69 mg/dL (ref 0.50–1.10)
Calcium: 9.4 mg/dL (ref 8.6–10.2)
Glucose, Bld: 100 mg/dL — ABNORMAL HIGH (ref 65–99)
POTASSIUM: 3.6 mmol/L (ref 3.5–5.3)
SODIUM: 139 mmol/L (ref 135–146)
Total Bilirubin: 0.4 mg/dL (ref 0.2–1.2)
Total Protein: 7 g/dL (ref 6.1–8.1)

## 2016-11-17 LAB — IRON,TIBC AND FERRITIN PANEL
%SAT: 23 % (ref 11–50)
Ferritin: 85 ng/mL (ref 10–232)
IRON: 70 ug/dL (ref 40–190)
TIBC: 308 ug/dL (ref 250–450)

## 2016-11-17 LAB — LIPID PANEL
CHOL/HDL RATIO: 4.8 ratio (ref <5.0)
CHOLESTEROL: 247 mg/dL — AB (ref <200)
HDL: 52 mg/dL (ref >50)
LDL CALC: 170 mg/dL — AB (ref <100)
TRIGLYCERIDES: 126 mg/dL (ref <150)
VLDL: 25 mg/dL (ref <30)

## 2016-11-17 LAB — VITAMIN D 25 HYDROXY (VIT D DEFICIENCY, FRACTURES): Vit D, 25-Hydroxy: 26 ng/mL — ABNORMAL LOW (ref 30–100)

## 2016-11-17 LAB — VITAMIN B12: VITAMIN B 12: 479 pg/mL (ref 200–1100)

## 2016-11-17 LAB — TSH: TSH: 0.89 mIU/L

## 2016-11-20 ENCOUNTER — Encounter: Payer: Self-pay | Admitting: Family Medicine

## 2016-11-22 ENCOUNTER — Other Ambulatory Visit: Payer: Self-pay | Admitting: Family Medicine

## 2016-11-22 DIAGNOSIS — E785 Hyperlipidemia, unspecified: Secondary | ICD-10-CM

## 2016-11-22 MED ORDER — ROSUVASTATIN CALCIUM 20 MG PO TABS: 20.0000 mg | ORAL_TABLET | Freq: Every day | ORAL | 3 refills | Status: DC

## 2016-12-25 ENCOUNTER — Other Ambulatory Visit (INDEPENDENT_AMBULATORY_CARE_PROVIDER_SITE_OTHER): Payer: 59

## 2016-12-25 DIAGNOSIS — E785 Hyperlipidemia, unspecified: Secondary | ICD-10-CM | POA: Diagnosis not present

## 2016-12-25 LAB — HEPATIC FUNCTION PANEL
ALBUMIN: 4.3 g/dL (ref 3.5–5.2)
ALT: 12 U/L (ref 0–35)
AST: 15 U/L (ref 0–37)
Alkaline Phosphatase: 45 U/L (ref 39–117)
BILIRUBIN DIRECT: 0.1 mg/dL (ref 0.0–0.3)
TOTAL PROTEIN: 7.7 g/dL (ref 6.0–8.3)
Total Bilirubin: 0.6 mg/dL (ref 0.2–1.2)

## 2016-12-25 LAB — LDL CHOLESTEROL, DIRECT: Direct LDL: 58 mg/dL

## 2016-12-29 DIAGNOSIS — Z882 Allergy status to sulfonamides status: Secondary | ICD-10-CM | POA: Diagnosis not present

## 2016-12-29 DIAGNOSIS — R112 Nausea with vomiting, unspecified: Secondary | ICD-10-CM | POA: Diagnosis not present

## 2016-12-29 DIAGNOSIS — N200 Calculus of kidney: Secondary | ICD-10-CM | POA: Diagnosis not present

## 2016-12-29 DIAGNOSIS — N132 Hydronephrosis with renal and ureteral calculous obstruction: Secondary | ICD-10-CM | POA: Diagnosis not present

## 2016-12-29 DIAGNOSIS — N201 Calculus of ureter: Secondary | ICD-10-CM | POA: Diagnosis not present

## 2016-12-29 DIAGNOSIS — N1330 Unspecified hydronephrosis: Secondary | ICD-10-CM | POA: Diagnosis not present

## 2016-12-30 ENCOUNTER — Emergency Department
Admission: EM | Admit: 2016-12-30 | Discharge: 2016-12-30 | Disposition: A | Discharge status: Home / Self Care | Payer: 59 | Attending: Emergency Medicine | Admitting: Emergency Medicine

## 2016-12-30 DIAGNOSIS — N200 Calculus of kidney: Secondary | ICD-10-CM | POA: Insufficient documentation

## 2016-12-30 DIAGNOSIS — I10 Essential (primary) hypertension: Secondary | ICD-10-CM | POA: Diagnosis not present

## 2016-12-30 DIAGNOSIS — R319 Hematuria, unspecified: Secondary | ICD-10-CM | POA: Insufficient documentation

## 2016-12-30 DIAGNOSIS — N390 Urinary tract infection, site not specified: Secondary | ICD-10-CM | POA: Insufficient documentation

## 2016-12-30 DIAGNOSIS — E876 Hypokalemia: Secondary | ICD-10-CM | POA: Insufficient documentation

## 2016-12-30 DIAGNOSIS — Z79899 Other long term (current) drug therapy: Secondary | ICD-10-CM | POA: Insufficient documentation

## 2016-12-30 DIAGNOSIS — R1031 Right lower quadrant pain: Secondary | ICD-10-CM | POA: Diagnosis present

## 2016-12-30 DIAGNOSIS — N23 Unspecified renal colic: Secondary | ICD-10-CM | POA: Diagnosis not present

## 2016-12-30 DIAGNOSIS — N202 Calculus of kidney with calculus of ureter: Secondary | ICD-10-CM | POA: Diagnosis not present

## 2016-12-30 LAB — CBC
HEMATOCRIT: 35.9 % (ref 35.0–47.0)
Hemoglobin: 12.4 g/dL (ref 12.0–16.0)
MCH: 31.9 pg (ref 26.0–34.0)
MCHC: 34.6 g/dL (ref 32.0–36.0)
MCV: 92.4 fL (ref 80.0–100.0)
Platelets: 195 10*3/uL (ref 150–440)
RBC: 3.88 MIL/uL (ref 3.80–5.20)
RDW: 13.3 % (ref 11.5–14.5)
WBC: 16.8 10*3/uL — AB (ref 3.6–11.0)

## 2016-12-30 LAB — URINALYSIS, COMPLETE (UACMP) WITH MICROSCOPIC
Bilirubin Urine: NEGATIVE
Glucose, UA: NEGATIVE mg/dL
Ketones, ur: 5 mg/dL — AB
Nitrite: NEGATIVE
Protein, ur: 100 mg/dL — AB
SPECIFIC GRAVITY, URINE: 1.031 — AB (ref 1.005–1.030)
pH: 5 (ref 5.0–8.0)

## 2016-12-30 LAB — BASIC METABOLIC PANEL
ANION GAP: 9 (ref 5–15)
BUN: 13 mg/dL (ref 6–20)
CHLORIDE: 104 mmol/L (ref 101–111)
CO2: 25 mmol/L (ref 22–32)
Calcium: 9.3 mg/dL (ref 8.9–10.3)
Creatinine, Ser: 1 mg/dL (ref 0.44–1.00)
GFR calc non Af Amer: >60 mL/min (ref >60)
Glucose, Bld: 120 mg/dL — ABNORMAL HIGH (ref 65–99)
POTASSIUM: 2.7 mmol/L — AB (ref 3.5–5.1)
SODIUM: 138 mmol/L (ref 135–145)

## 2016-12-30 MED ORDER — ONDANSETRON HCL 4 MG PO TABS: 4.0000 mg | ORAL_TABLET | Freq: Three times a day (TID) | ORAL | 0 refills | Status: DC | PRN

## 2016-12-30 MED ORDER — SODIUM CHLORIDE 0.9 % IV BOLUS (SEPSIS)
1000.0000 mL | Freq: Once | INTRAVENOUS | Status: AC
  Administered 2016-12-30: 1000 mL via INTRAVENOUS

## 2016-12-30 MED ORDER — OXYCODONE-ACETAMINOPHEN 5-325 MG PO TABS: 1.0000 | ORAL_TABLET | ORAL | 0 refills | Status: DC | PRN

## 2016-12-30 MED ORDER — CIPROFLOXACIN HCL 500 MG PO TABS
500.0000 mg | ORAL_TABLET | Freq: Once | ORAL | Status: AC
  Administered 2016-12-30: 500 mg via ORAL
  Filled 2016-12-30: qty 1

## 2016-12-30 MED ORDER — MORPHINE SULFATE (PF) 4 MG/ML IV SOLN
4.0000 mg | Freq: Once | INTRAVENOUS | Status: AC
  Administered 2016-12-30: 4 mg via INTRAVENOUS
  Filled 2016-12-30: qty 1

## 2016-12-30 MED ORDER — KETOROLAC TROMETHAMINE 30 MG/ML IJ SOLN
30.0000 mg | Freq: Once | INTRAMUSCULAR | Status: AC
  Administered 2016-12-30: 30 mg via INTRAVENOUS
  Filled 2016-12-30: qty 1

## 2016-12-30 MED ORDER — POTASSIUM CHLORIDE CRYS ER 20 MEQ PO TBCR
40.0000 meq | EXTENDED_RELEASE_TABLET | Freq: Once | ORAL | Status: AC
  Administered 2016-12-30: 40 meq via ORAL
  Filled 2016-12-30: qty 2

## 2016-12-30 MED ORDER — KETOROLAC TROMETHAMINE 10 MG PO TABS: 10.0000 mg | ORAL_TABLET | Freq: Three times a day (TID) | ORAL | 0 refills | Status: DC | PRN

## 2016-12-30 MED ORDER — OXYCODONE-ACETAMINOPHEN 5-325 MG PO TABS
1.0000 | ORAL_TABLET | Freq: Once | ORAL | Status: AC
  Administered 2016-12-30: 1 via ORAL
  Filled 2016-12-30: qty 1

## 2016-12-30 MED ORDER — TAMSULOSIN HCL 0.4 MG PO CAPS: 0.4000 mg | ORAL_CAPSULE | Freq: Every day | ORAL | 0 refills | Status: DC

## 2016-12-30 MED ORDER — ONDANSETRON HCL 4 MG/2ML IJ SOLN
4.0000 mg | Freq: Once | INTRAMUSCULAR | Status: DC
  Filled 2016-12-30: qty 2

## 2016-12-30 MED ORDER — CIPROFLOXACIN HCL 500 MG PO TABS: 500.0000 mg | ORAL_TABLET | Freq: Two times a day (BID) | ORAL | 0 refills | Status: DC

## 2016-12-30 NOTE — ED Notes (Signed)
Pt stated nausea subsided and wanted to wait a while before taking the Zofran.

## 2016-12-30 NOTE — Discharge Instructions (Addendum)
You were evaluated for uncontrolled pain associated with known kidney stone.  You were given symptomatic medications and started on antibiotic cipro for possible urinary infection. You may take up to 2 tabs of the percocet every 4 hours as needed for severe pain

## 2016-12-30 NOTE — ED Triage Notes (Signed)
Pt diagnosed with kidney stone Friday. Reports pain worsening, unable to get pain under control with percocet. VS stable.

## 2016-12-30 NOTE — ED Provider Notes (Signed)
[facility] Emergency Department Provider Note ____________________________________________   I have reviewed the triage vital signs and the triage nursing note.  HISTORY  Chief Complaint No chief complaint on file.   Historian Patient  HPI Anna Kim is a 41 y.o. female presents with continued uncontrolled pain from diagnosed kidney stone at an outside hospital on Friday.  Took one percocet yesterday evening and then one this morning and pain has been unrelenting at the right flank.    Brought CT abd read with her: 3x6 distal right ureteral stone with hydro.  Positive for nausea and vomiting associated with severe pain this morning.  Nothing makes it worse or better.    Past Medical History:  Diagnosis Date  . Migraine   . Vaginal Pap smear, abnormal    Remote history of 1 abnormal pap    Patient Active Problem List   Diagnosis Date Noted  . Encounter for health maintenance examination with abnormal findings 11/16/2016  . Essential hypertension 01/20/2016  . Prediabetes 01/20/2016  . Hyperlipidemia 01/20/2016    Past Surgical History:  Procedure Laterality Date  . TONSILLECTOMY  2011    Prior to Admission medications   Medication Sig Start Date End Date Taking? Authorizing Provider  Calcium Carb-Cholecalciferol (CALCIUM + D3 PO) Take 1 tablet by mouth daily.   Yes [provider]  Cholecalciferol 50000 units TABS Take 50,000 Units by mouth every 7 (seven) days.   Yes [provider]  hydrochlorothiazide (MICROZIDE) 12.5 MG capsule Take 1 capsule (12.5 mg total) by mouth daily. 11/16/16  Yes Cook, Jayce G, DO  Omega-3 Fatty Acids (FISH OIL) 1000 MG CAPS Take 1,000 mg by mouth daily.   Yes [provider]  rosuvastatin (CRESTOR) 20 MG tablet Take 1 tablet (20 mg total) by mouth daily. 11/22/16  Yes Leone Haven, MD  ciprofloxacin (CIPRO) 500 MG tablet Take 1 tablet (500 mg total) by mouth 2 (two) times  daily. 12/30/16   Lisa Roca, MD  ketorolac (TORADOL) 10 MG tablet Take 1 tablet (10 mg total) by mouth every 8 (eight) hours as needed for moderate pain. 12/30/16   Lisa Roca, MD  ondansetron (ZOFRAN) 4 MG tablet Take 1 tablet (4 mg total) by mouth every 8 (eight) hours as needed for nausea or vomiting. 12/30/16   Lisa Roca, MD  tamsulosin (FLOMAX) 0.4 MG CAPS capsule Take 1 capsule (0.4 mg total) by mouth daily. 12/30/16   Lisa Roca, MD    Allergies  Allergen Reactions  . Ceftriaxone Hives  . Erythromycin Other (See Comments)    GI Upset  . Sulfa Antibiotics Rash    Family History  Problem Relation Age of Onset  . Cancer Maternal Aunt        ovarian  . Hypertension Mother   . Hypertension Father     Social History Social History  Substance Use Topics  . Smoking status: Never Smoker  . Smokeless tobacco: Never Used  . Alcohol use Yes     Comment: occas    Review of Systems  Constitutional: Negative for fever. Eyes: Negative for visual changes. ENT: Negative for sore throat. Cardiovascular: Negative for chest pain. Respiratory: Negative for shortness of breath. Gastrointestinal: Negative for diarrhea. Genitourinary: Negative for dysuria. Musculoskeletal: Negative for back pain. Skin: Negative for rash. Neurological: Negative for headache.  ____________________________________________   PHYSICAL EXAM:  VITAL SIGNS: ED Triage Vitals  Enc Vitals Group     BP 12/30/16 0940 (!) 163/81  Pulse Rate 12/30/16 0940 64     Resp 12/30/16 0940 16     Temp 12/30/16 0940 98.2 F (36.8 C)     Temp Source 12/30/16 0940 Oral     SpO2 12/30/16 0940 98 %     Weight 12/30/16 0941 170 lb (77.1 kg)     Height 12/30/16 0941 5\' 6"  (1.676 m)     Head Circumference --      Peak Flow --      Pain Score --      Pain Loc --      Pain Edu? --      Excl. in [place]? --      Constitutional: Alert and oriented. Well appearing overall, but holding right flank and rocking  due to pain HEENT   Head: Normocephalic and atraumatic.      Eyes: Conjunctivae are normal. Pupils equal and round.       Ears:         Nose: No congestion/rhinnorhea.   Mouth/Throat: Mucous membranes are moist.   Neck: No stridor. Cardiovascular/Chest: Normal rate, regular rhythm.  No murmurs, rubs, or gallops. Respiratory: Normal respiratory effort without tachypnea nor retractions. Breath sounds are clear and equal bilaterally. No wheezes/rales/rhonchi. Gastrointestinal: Soft. No distention, no guarding, no rebound. Nontender to superficial and deep palpation.  Genitourinary/rectal:Deferred Musculoskeletal: Nontender with normal range of motion in all extremities. No joint effusions.  No lower extremity tenderness.  No edema. Neurologic:  Normal speech and language. No gross or focal neurologic deficits are appreciated. Skin:  Skin is warm, dry and intact. No rash noted. Psychiatric: Mood and affect are normal. Speech and behavior are normal. Patient exhibits appropriate insight and judgment.   ____________________________________________  LABS (pertinent positives/negatives) I, Lisa Roca, MD the attending physician have reviewed the labs noted below.  Labs Reviewed  URINALYSIS, COMPLETE (UACMP) WITH MICROSCOPIC - Abnormal; Notable for the following:       Result Value   Color, Urine AMBER (*)    APPearance CLOUDY (*)    Specific Gravity, Urine 1.031 (*)    Hgb urine dipstick SMALL (*)    Ketones, ur 5 (*)    Protein, ur 100 (*)    Leukocytes, UA TRACE (*)    Bacteria, UA FEW (*)    Squamous Epithelial / LPF 6-30 (*)    All other components within normal limits  BASIC METABOLIC PANEL - Abnormal; Notable for the following:    Potassium 2.7 (*)    Glucose, Bld 120 (*)    All other components within normal limits  CBC - Abnormal; Notable for the following:    WBC 16.8 (*)    All other components within normal limits  URINE CULTURE     ____________________________________________    EKG I, Lisa Roca, MD, the attending physician have personally viewed and interpreted all ECGs.  None ____________________________________________  RADIOLOGY All Xrays were viewed by me.  Imaging interpreted by Radiologist, and I, Lisa Roca, MD the attending physician have reviewed the radiologist interpretation noted below.  None __________________________________________  PROCEDURES  Procedure(s) performed: None  Critical Care performed: None  ____________________________________________  No current facility-administered medications on file prior to encounter.    Current Outpatient Prescriptions on File Prior to Encounter  Medication Sig Dispense Refill  . hydrochlorothiazide (MICROZIDE) 12.5 MG capsule Take 1 capsule (12.5 mg total) by mouth daily. 90 capsule 3  . rosuvastatin (CRESTOR) 20 MG tablet Take 1 tablet (20 mg total) by mouth daily. 90 tablet 3  ____________________________________________  ED COURSE / ASSESSMENT AND PLAN  Pertinent labs & imaging results that were available during my care of the patient were reviewed by me and considered in my medical decision making (see chart for details).   Uncontrolled pain from known CT diagnosed right ureteral pain from a few days ago.  Sounds like she was told to be judicious with the narcotic and one tab has not relieved the pain.  We discussed to stay ahead of the pain and able to increase dose as needed to 2 tabs every 4 hours (prescribed 1 tab every 6 hours).   Elevated wbc, nonspecific.  UA with few bacteria and trace leukocytes.  Unclear whether contaminant, but given persistent pain and elevate wbc, will go ahead and cover with cipro (all to sulfa and cephalosporin) and sent culture.  No evidence of renal failure.  No evidence for sepsis. Will add zofran, toradol, increased dose of percocet as needed, and cipro.  She has phenergan and so may not  need the zofran.  Pain controlled, will dc home.  DIFFERENTIAL DIAGNOSIS: including but not limited to uncontrolled pain, obstruction, pylonephritis, kidney failure, etc.  CONSULTATIONS:   None   Patient / Family / Caregiver informed of clinical course, medical decision-making process, and agree with plan.   I discussed return precautions, follow-up instructions, and discharge instructions with patient and/or family.  Discharge Instructions : You were evaluated for uncontrolled pain associated with known kidney stone.  You were given symptomatic medications and started on antibiotic cipro for possible urinary infection. You may take up to 2 tabs of the percocet every 4 hours as needed for severe pain  ___________________________________________   FINAL CLINICAL IMPRESSION(S) / ED DIAGNOSES   Final diagnoses:  Ureteral colic  Kidney stone on right side  Hypokalemia  Urinary tract infection with hematuria, site unspecified              Note: This dictation was prepared with Dragon dictation. Any transcriptional errors that result from this process are unintentional    Lisa Roca, MD 12/30/16 1208

## 2017-01-01 LAB — URINE CULTURE: Culture: <10000 — AB

## 2017-01-03 ENCOUNTER — Other Ambulatory Visit: Payer: Self-pay | Admitting: Urology

## 2017-01-03 ENCOUNTER — Ambulatory Visit
Admission: RE | Admit: 2017-01-03 | Discharge: 2017-01-03 | Disposition: A | Discharge status: Home / Self Care | Payer: Self-pay | Source: Ambulatory Visit | Attending: Urology | Admitting: Urology

## 2017-01-03 DIAGNOSIS — N200 Calculus of kidney: Secondary | ICD-10-CM

## 2017-01-04 NOTE — Progress Notes (Signed)
01/07/2017 2:32 PM   Gayl L Lippold November 25, 1975 [id number]  Referring provider: Leone Haven, MD [address] [place], [place] [address]  Chief Complaint  Patient presents with  . New Patient (Initial Visit)    ureteral stone referred by ED    HPI: Patient is a 41 year old Caucasian female who presents today for nephrolithiasis.  Patient states the onset of the pain was one week ago.   It was sharp in nature.  It lasted for several hours.  The pain was located in the right flank and radiated to the right waist.  The pain was a 10/10.  Nothing made the pain better.   Nothing made the pain worse.  She did not have gross hematuria, fevers, chills, but she did have nausea and vomiting.    She was seen in [place] originally and CT was performed at that location.  It is available in the [place] system.    She was seen again in [facility] ED on 12/30/2016 for pain control.  Her creatinine at that time was 1.00.  Her WBC was 16.8.  Urine culture was negative.    CT Renal stone study performed on 12/29/2016 demonstrated an obstructing 7 mm stone in the right distal ureter.  Today, she has passed the stone prior to her visit with [place].  She brings it in for analysis.   Her UA Is unremarkable.    She does not have a prior history of stones.   She states her father has remote history of nephrolithiasis.  She also mentioned that she has been suffering from stress incontinence. She states she loses urine when she jumps on a trampoline or tries to exercise. This is been going on for the last several months.    She states that she is seeing her gynecologist was told that she did not have a cystocele and her pelvic muscles did not demonstrate laxity.     Reviewed referral notes.    ALAYSIAH BROWDER is a 41 y.o. female presents with continued uncontrolled pain from diagnosed kidney stone at an outside hospital on Friday.  Took one percocet yesterday evening and then one this morning  and pain has been unrelenting at the right flank.  Brought CT abd read with her: 3x6 distal right ureteral stone with hydro.  Positive for nausea and vomiting associated with severe pain this morning.  Nothing makes it worse or better  PMH: Past Medical History:  Diagnosis Date  . HLD (hyperlipidemia)   . HTN (hypertension)   . Migraine   . Ureteral stone   . Vaginal Pap smear, abnormal    Remote history of 1 abnormal pap    Surgical History: Past Surgical History:  Procedure Laterality Date  . TONSILLECTOMY  2011    Home Medications:  Allergies as of 01/07/2017      Reactions   Ceftriaxone Hives   Erythromycin Other (See Comments)   GI Upset   Sulfa Antibiotics Rash      Medication List       Accurate as of 01/07/17  2:32 PM. Always use your most recent med list.          CALCIUM + D3 PO Take 1 tablet by mouth daily.   cetirizine 5 MG tablet Commonly known as:  ZYRTEC Take by mouth.   Cholecalciferol 50000 units Tabs Take 50,000 Units by mouth every 7 (seven) days.   ciprofloxacin 500 MG tablet Commonly known as:  CIPRO Take  1 tablet (500 mg total) by mouth 2 (two) times daily.   Fish Oil 1000 MG Caps Take 1,000 mg by mouth daily.   hydrochlorothiazide 12.5 MG capsule Commonly known as:  MICROZIDE Take 1 capsule (12.5 mg total) by mouth daily.   ketorolac 10 MG tablet Commonly known as:  TORADOL Take 1 tablet (10 mg total) by mouth every 8 (eight) hours as needed for moderate pain.   LOESTRIN 1.5/30 (21) 1.5-30 MG-MCG tablet Generic drug:  Norethindrone Acetate-Ethinyl Estradiol Take by mouth.   ondansetron 4 MG tablet Commonly known as:  ZOFRAN Take 1 tablet (4 mg total) by mouth every 8 (eight) hours as needed for nausea or vomiting.   oxyCODONE-acetaminophen 5-325 MG tablet Commonly known as:  ROXICET Take 1 tablet by mouth every 4 (four) hours as needed for severe pain.   rosuvastatin 20 MG tablet Commonly known as:  CRESTOR Take 1  tablet (20 mg total) by mouth daily.   tamsulosin 0.4 MG Caps capsule Commonly known as:  FLOMAX Take 1 capsule (0.4 mg total) by mouth daily.       Allergies:  Allergies  Allergen Reactions  . Ceftriaxone Hives  . Erythromycin Other (See Comments)    GI Upset  . Sulfa Antibiotics Rash    Family History: Family History  Problem Relation Age of Onset  . Cancer Maternal Aunt        ovarian  . Hypertension Mother   . Hypertension Father   . Kidney cancer Neg Hx   . Bladder Cancer Neg Hx     Social History:  reports that she has never smoked. She has never used smokeless tobacco. She reports that she drinks alcohol. She reports that she does not use drugs.  ROS: UROLOGY Frequent Urination?: No Hard to postpone urination?: No Burning/pain with urination?: No Get up at night to urinate?: Yes Leakage of urine?: Yes Urine stream starts and stops?: No Trouble starting stream?: No Do you have to strain to urinate?: No Blood in urine?: No Urinary tract infection?: No Sexually transmitted disease?: No Injury to kidneys or bladder?: No Painful intercourse?: No Weak stream?: No Currently pregnant?: No Vaginal bleeding?: No Last menstrual period?: 01/03/2017  Gastrointestinal Nausea?: Yes Vomiting?: No Indigestion/heartburn?: No Diarrhea?: No Constipation?: No  Constitutional Fever: No Night sweats?: No Weight loss?: No Fatigue?: No  Skin Skin rash/lesions?: No Itching?: No  Eyes Blurred vision?: No Double vision?: No  Ears/Nose/Throat Sore throat?: No Sinus problems?: No  Hematologic/Lymphatic Swollen glands?: No Easy bruising?: No  Cardiovascular Leg swelling?: No Chest pain?: No  Respiratory Cough?: No Shortness of breath?: No  Endocrine Excessive thirst?: No  Musculoskeletal Back pain?: Yes Joint pain?: No  Neurological Headaches?: No Dizziness?: No  Psychologic Depression?: No Anxiety?: No  Physical Exam: BP 122/86   Pulse  82   Ht 5\' 6"  (1.676 m)   Wt 169 lb 6.4 oz (76.8 kg)   LMP 11/03/2016   BMI 27.34 kg/m   Constitutional: Well nourished. Alert and oriented, No acute distress. HEENT: [place] AT, moist mucus membranes. Trachea midline, no masses. Cardiovascular: No clubbing, cyanosis, or edema. Respiratory: Normal respiratory effort, no increased work of breathing. GI: Abdomen is soft, non tender, non distended, no abdominal masses. Liver and spleen not palpable.  No hernias appreciated.  Stool sample for occult testing is not indicated.   GU: No CVA tenderness.  No bladder fullness or masses.   Skin: No rashes, bruises or suspicious lesions. Lymph: No cervical or inguinal adenopathy.  Neurologic: Grossly intact, no focal deficits, moving all 4 extremities. Psychiatric: Normal mood and affect.  Laboratory Data: Lab Results  Component Value Date   WBC 16.8 (H) 12/30/2016   HGB 12.4 12/30/2016   HCT 35.9 12/30/2016   MCV 92.4 12/30/2016   PLT 195 12/30/2016    Lab Results  Component Value Date   CREATININE 1.00 12/30/2016    Lab Results  Component Value Date   HGBA1C 5.7 01/20/2016    Lab Results  Component Value Date   TSH 0.89 11/16/2016       Component Value Date/Time   CHOL 247 (H) 11/16/2016 1601   HDL 52 11/16/2016 1601   CHOLHDL 4.8 11/16/2016 1601   VLDL 25 11/16/2016 1601   LDLCALC 170 (H) 11/16/2016 1601    Lab Results  Component Value Date   AST 15 12/25/2016   Lab Results  Component Value Date   ALT 12 12/25/2016     Urinalysis Negative.  See EPIC.    I have reviewed the labs.   Pertinent Imaging: CT Renal stone study demonstrates a right distal stone measuring 7 mm x 5 mm causing obstruction   I have independently reviewed the films.    Assessment & Plan:    1. Right ureteral stone  - stone sent for analysis    2. Right hydronephrosis  - obtain RUS to ensure the hydronephrosis has resolved  3. Microscopic hematuria  - UA today demonstrates no  hematuria  - continue to monitor the patient's UA after the treatment/passage of the stone to ensure the hematuria has resolved  - if hematuria persists, we will pursue a hematuria workup with CT Urogram and cystoscopy if appropriate.  4. Stress urinary incontinence  - Given handout on Kegel exercises  - referred to PT for pelvic floor strengthening  - Up appointment with Dr. Matilde Sprang for further evaluation   Return for RTC for RUS report.  These notes generated with voice recognition software. I apologize for typographical errors.  Zara Council, [facility] [address] [place], [place] [address] [phone]

## 2017-01-07 ENCOUNTER — Encounter: Payer: Self-pay | Admitting: Urology

## 2017-01-07 ENCOUNTER — Ambulatory Visit (INDEPENDENT_AMBULATORY_CARE_PROVIDER_SITE_OTHER): Payer: 59 | Admitting: Urology

## 2017-01-07 VITALS — BP 122/86 | HR 82 | Ht 66.0 in | Wt 169.4 lb

## 2017-01-07 DIAGNOSIS — N200 Calculus of kidney: Secondary | ICD-10-CM | POA: Diagnosis not present

## 2017-01-07 DIAGNOSIS — N201 Calculus of ureter: Secondary | ICD-10-CM

## 2017-01-07 DIAGNOSIS — N393 Stress incontinence (female) (male): Secondary | ICD-10-CM | POA: Diagnosis not present

## 2017-01-07 DIAGNOSIS — R3129 Other microscopic hematuria: Secondary | ICD-10-CM | POA: Diagnosis not present

## 2017-01-07 DIAGNOSIS — N132 Hydronephrosis with renal and ureteral calculous obstruction: Secondary | ICD-10-CM | POA: Diagnosis not present

## 2017-01-08 LAB — MICROSCOPIC EXAMINATION: RBC MICROSCOPIC, UA: NONE SEEN /HPF (ref 0–?)

## 2017-01-08 LAB — URINALYSIS, COMPLETE
BILIRUBIN UA: NEGATIVE
GLUCOSE, UA: NEGATIVE
Ketones, UA: NEGATIVE
LEUKOCYTES UA: NEGATIVE
Nitrite, UA: NEGATIVE
RBC, UA: NEGATIVE
Urobilinogen, Ur: 0.2 mg/dL (ref 0.2–1.0)
pH, UA: 5.5 (ref 5.0–7.5)

## 2017-01-10 LAB — CULTURE, URINE COMPREHENSIVE

## 2017-01-14 ENCOUNTER — Other Ambulatory Visit: Payer: Self-pay | Admitting: Urology

## 2017-01-16 ENCOUNTER — Ambulatory Visit
Admission: RE | Admit: 2017-01-16 | Discharge: 2017-01-16 | Disposition: A | Discharge status: Home / Self Care | Payer: 59 | Source: Ambulatory Visit | Attending: Urology | Admitting: Urology

## 2017-01-16 DIAGNOSIS — Z87442 Personal history of urinary calculi: Secondary | ICD-10-CM | POA: Diagnosis not present

## 2017-01-16 DIAGNOSIS — Z09 Encounter for follow-up examination after completed treatment for conditions other than malignant neoplasm: Secondary | ICD-10-CM | POA: Insufficient documentation

## 2017-01-16 DIAGNOSIS — R3129 Other microscopic hematuria: Secondary | ICD-10-CM

## 2017-01-16 DIAGNOSIS — N132 Hydronephrosis with renal and ureteral calculous obstruction: Secondary | ICD-10-CM

## 2017-01-16 DIAGNOSIS — N201 Calculus of ureter: Secondary | ICD-10-CM

## 2017-01-17 ENCOUNTER — Encounter: Payer: Self-pay | Admitting: Family Medicine

## 2017-01-17 ENCOUNTER — Other Ambulatory Visit: Payer: Self-pay | Admitting: Family Medicine

## 2017-01-17 ENCOUNTER — Telehealth: Payer: Self-pay | Admitting: Urology

## 2017-01-17 DIAGNOSIS — E876 Hypokalemia: Secondary | ICD-10-CM

## 2017-01-17 NOTE — Telephone Encounter (Signed)
Her stone analysis revealed a calcium oxalate stone. This is the most common type of stones that people form.  General preventative measures consist of drinking 2.5 L of water daily, adding real lemonade or orange juice to the diet, avoiding sodas, reducing salt intake and reducing protein intake. If she would like a more specific preventative plan, we can offer her a 24-hour urine for a metabolic workup.

## 2017-01-17 NOTE — Telephone Encounter (Signed)
Spoke with patient and gave all results. Reminded to let PCP know results so they can keep an eye on her kidney function. Patient agreeing to go by Anna Kim's recommendations for calcium oxalate stones for now and not do a 24 hour urine. Patient ok with plans.

## 2017-01-17 NOTE — Telephone Encounter (Signed)
-----   Message from Nori Riis, PA-C sent at 01/17/2017  8:34 AM EDT ----- Please let Anna Kim know that her RUS did not show any hydronephrosis.  There was an appearance of her cortex of both kidney's were starting to thin.  She should let her PCP know of this finding so that they can keep an eye on her kidney function.

## 2017-01-21 ENCOUNTER — Encounter: Payer: Self-pay | Admitting: Urology

## 2017-01-21 ENCOUNTER — Ambulatory Visit (INDEPENDENT_AMBULATORY_CARE_PROVIDER_SITE_OTHER): Payer: 59 | Admitting: Urology

## 2017-01-21 VITALS — BP 130/82 | HR 76 | Ht 66.0 in | Wt 165.5 lb

## 2017-01-21 DIAGNOSIS — N3946 Mixed incontinence: Secondary | ICD-10-CM

## 2017-01-21 NOTE — Progress Notes (Signed)
01/21/2017 3:59 PM   Anna Kim 10-May-1975 [id number]  Referring provider: Leone Haven, MD [address] [place], [place] [address]  Chief Complaint  Patient presents with  . Follow-up    RUS results    HPI: Anna Kim: stones and SUI- passed stone; kegels given and PT consult  Today The patient leaks with coughing and sneezing and running and jumping but likely not with bending.  She works in our radiology department.  She might have rare urge incontinence and has no bedwetting and wears one light pad per day  The patient generally voids every 1 or 2 hours and gets up once or twice at night.  She has had one kidney stone recently passed.  She has not had previous GU surgery.  She has not had a hysterectomy and does not get recurrent urinary tract infections  Modifying factors: There are no other modifying factors  Associated signs and symptoms: There are no other associated signs and symptoms Aggravating and relieving factors: There are no other aggravating or relieving factors Severity: Moderate Duration: Persistent        PMH: Past Medical History:  Diagnosis Date  . HLD (hyperlipidemia)   . HTN (hypertension)   . Migraine   . Ureteral stone   . Vaginal Pap smear, abnormal    Remote history of 1 abnormal pap    Surgical History: Past Surgical History:  Procedure Laterality Date  . TONSILLECTOMY  2011    Home Medications:  Allergies as of 01/21/2017      Reactions   Ceftriaxone Hives   Erythromycin Other (See Comments)   GI Upset   Sulfa Antibiotics Rash      Medication List       Accurate as of 01/21/17  3:59 PM. Always use your most recent med list.          CALCIUM + D3 PO Take 1 tablet by mouth daily.   cetirizine 5 MG tablet Commonly known as:  ZYRTEC Take by mouth.   Cholecalciferol 50000 units Tabs Take 50,000 Units by mouth every 7 (seven) days.   Fish Oil 1000 MG Caps Take 1,000 mg by mouth daily.     hydrochlorothiazide 12.5 MG capsule Commonly known as:  MICROZIDE Take 1 capsule (12.5 mg total) by mouth daily.   LOESTRIN 1.5/30 (21) 1.5-30 MG-MCG tablet Generic drug:  Norethindrone Acetate-Ethinyl Estradiol Take by mouth.   rosuvastatin 20 MG tablet Commonly known as:  CRESTOR Take 1 tablet (20 mg total) by mouth daily.       Allergies:  Allergies  Allergen Reactions  . Ceftriaxone Hives  . Erythromycin Other (See Comments)    GI Upset  . Sulfa Antibiotics Rash    Family History: Family History  Problem Relation Age of Onset  . Cancer Maternal Aunt        ovarian  . Hypertension Mother   . Hypertension Father   . Kidney cancer Neg Hx   . Bladder Cancer Neg Hx     Social History:  reports that she has never smoked. She has never used smokeless tobacco. She reports that she drinks alcohol. She reports that she does not use drugs.  ROS: UROLOGY Frequent Urination?: No Hard to postpone urination?: No Burning/pain with urination?: No Get up at night to urinate?: Yes Leakage of urine?: Yes Urine stream starts and stops?: No Trouble starting stream?: No Do you have to strain to urinate?: No Blood in urine?: No Urinary tract infection?: No  Sexually transmitted disease?: No Injury to kidneys or bladder?: No Painful intercourse?: No Weak stream?: No Currently pregnant?: No Vaginal bleeding?: No Last menstrual period?: n  Gastrointestinal Nausea?: No Vomiting?: No Indigestion/heartburn?: No Diarrhea?: No Constipation?: No  Constitutional Fever: No Night sweats?: No Weight loss?: No Fatigue?: No  Skin Skin rash/lesions?: No Itching?: No  Eyes Blurred vision?: No Double vision?: No  Ears/Nose/Throat Sore throat?: No Sinus problems?: No  Hematologic/Lymphatic Swollen glands?: No Easy bruising?: No  Cardiovascular Leg swelling?: No Chest pain?: No  Respiratory Cough?: No Shortness of breath?: No  Endocrine Excessive thirst?:  No  Musculoskeletal Back pain?: No Joint pain?: No  Neurological Headaches?: No Dizziness?: No  Psychologic Depression?: No Anxiety?: No  Physical Exam: BP 130/82 (BP Location: Right Arm, Patient Position: Sitting, Cuff Size: Normal)   Pulse 76   Ht 5\' 6"  (1.676 m)   Wt 165 lb 8 oz (75.1 kg)   BMI 26.71 kg/m   Constitutional:  Alert and oriented, No acute distress. HEENT: [place] AT, moist mucus membranes.  Trachea midline, no masses. Cardiovascular: No clubbing, cyanosis, or edema. Respiratory: Normal respiratory effort, no increased work of breathing. GI: Abdomen is soft, nontender, nondistended, no abdominal masses GU: Grade 1 hypermobility or mild grade 2 hypomobility the bladder neck and no stress incontinence or prolapse Skin: No rashes, bruises or suspicious lesions. Lymph: No cervical or inguinal adenopathy. Neurologic: Grossly intact, no focal deficits, moving all 4 extremities. Psychiatric: Normal mood and affect.  Laboratory Data: Lab Results  Component Value Date   WBC 16.8 (H) 12/30/2016   HGB 12.4 12/30/2016   HCT 35.9 12/30/2016   MCV 92.4 12/30/2016   PLT 195 12/30/2016    Lab Results  Component Value Date   CREATININE 1.00 12/30/2016    No results found for: PSA  No results found for: TESTOSTERONE  Lab Results  Component Value Date   HGBA1C 5.7 01/20/2016    Urinalysis    Component Value Date/Time   COLORURINE AMBER (A) 12/30/2016 0942   APPEARANCEUR Cloudy (A) 01/07/2017 1401   LABSPEC 1.031 (H) 12/30/2016 0942   PHURINE 5.0 12/30/2016 0942   GLUCOSEU Negative 01/07/2017 1401   HGBUR SMALL (A) 12/30/2016 0942   BILIRUBINUR Negative 01/07/2017 1401   KETONESUR 5 (A) 12/30/2016 0942   PROTEINUR 1+ (A) 01/07/2017 1401   PROTEINUR 100 (A) 12/30/2016 0942   UROBILINOGEN 0.2 09/06/2015 0911   NITRITE Negative 01/07/2017 1401   NITRITE NEGATIVE 12/30/2016 0942   LEUKOCYTESUR Negative 01/07/2017 1401    Pertinent Imaging: As  above  Assessment & Plan: The patient has mild stress incontinence and possibly rare urgency but this may have been worse on Flomax trying to pass her stone.  She has mild frequency and nocturia.  Patient will see physical therapy and has an appointment.  She understands the role of urodynamics if she ever is considering surgery and will book another appointment.  Her follow-up renal ultrasound ordered was normal.  I will see her as needed  I told her if she does not reach her goal with physical therapy to drop by the front desk and have them ask me to order the urodynamics without an appointment  There are no diagnoses linked to this encounter.  No Follow-up on file.  Reece Packer, MD  [facility] [address] [place], [place] [address] [phone]

## 2017-01-23 ENCOUNTER — Other Ambulatory Visit: Payer: Self-pay | Admitting: Family Medicine

## 2017-01-23 ENCOUNTER — Other Ambulatory Visit (INDEPENDENT_AMBULATORY_CARE_PROVIDER_SITE_OTHER): Payer: 59

## 2017-01-23 DIAGNOSIS — E876 Hypokalemia: Secondary | ICD-10-CM

## 2017-01-23 LAB — BASIC METABOLIC PANEL
BUN: 13 mg/dL (ref 6–23)
CALCIUM: 9.6 mg/dL (ref 8.4–10.5)
CO2: 27 meq/L (ref 19–32)
CREATININE: 0.69 mg/dL (ref 0.40–1.20)
Chloride: 102 mEq/L (ref 96–112)
GFR: 98.72 mL/min (ref >60.00)
GLUCOSE: 95 mg/dL (ref 70–99)
Potassium: 3.2 mEq/L — ABNORMAL LOW (ref 3.5–5.1)
SODIUM: 138 meq/L (ref 135–145)

## 2017-01-23 MED ORDER — POTASSIUM CHLORIDE CRYS ER 20 MEQ PO TBCR: 40.0000 meq | EXTENDED_RELEASE_TABLET | Freq: Every day | ORAL | 0 refills | Status: DC

## 2017-01-25 ENCOUNTER — Encounter: Payer: Self-pay | Admitting: Family Medicine

## 2017-01-25 ENCOUNTER — Ambulatory Visit (INDEPENDENT_AMBULATORY_CARE_PROVIDER_SITE_OTHER): Payer: 59 | Admitting: Family Medicine

## 2017-01-25 VITALS — BP 118/80 | HR 70 | Temp 98.6°F | Wt 173.0 lb

## 2017-01-25 DIAGNOSIS — E785 Hyperlipidemia, unspecified: Secondary | ICD-10-CM | POA: Diagnosis not present

## 2017-01-25 DIAGNOSIS — R7303 Prediabetes: Secondary | ICD-10-CM | POA: Diagnosis not present

## 2017-01-25 DIAGNOSIS — Q639 Congenital malformation of kidney, unspecified: Secondary | ICD-10-CM | POA: Diagnosis not present

## 2017-01-25 DIAGNOSIS — E876 Hypokalemia: Secondary | ICD-10-CM

## 2017-01-25 DIAGNOSIS — I10 Essential (primary) hypertension: Secondary | ICD-10-CM | POA: Diagnosis not present

## 2017-01-25 HISTORY — DX: Hypokalemia: E87.6

## 2017-01-25 NOTE — Patient Instructions (Signed)
Nice to see you. We'll check some lab work next week. Please discontinue the hydrochlorothiazide. Please monitor your blood pressure at home and if it starts to creep up please let us know. Goal is less than 140/90 at this time.

## 2017-01-25 NOTE — Assessment & Plan Note (Signed)
Well-controlled. Suspect the hydrochlorothiazide contributing to her hypokalemia. We'll have her discontinue this and check at home and at work. Given goal of less than 140/90. If starts to run higher than that she'll let us know. Could consider adding back medication.

## 2017-01-25 NOTE — Assessment & Plan Note (Signed)
Tolerating Crestor. Knows to avoid pregnancy with this medication.

## 2017-01-25 NOTE — Assessment & Plan Note (Signed)
Recheck next week. Discontinue hydrochlorothiazide.

## 2017-01-25 NOTE — Assessment & Plan Note (Signed)
Noted on recent ultrasound. Renal function has been normal. We will check urine protein and recheck renal function.

## 2017-01-25 NOTE — Assessment & Plan Note (Signed)
Check A1c. 

## 2017-01-25 NOTE — Progress Notes (Signed)
  Tommi Rumps, MD Phone: [phone]  Anna Kim is a 41 y.o. female who presents today for follow-up.  Hypertension: Not checking. Taking HCTZ. No shortness of breath, chest pain, or edema. Nodes previously borderline elevated and was subsequently put on HCTZ.  She's had some hypokalemia recently. Did improve with treatment though has been taking HCTZ which is likely contributing.  She saw urology for kidney stone. She had ultrasound which did reveal thinning of the renal cortex bilaterally. Renal function has been normal.  Hyperlipidemia: Taking Crestor. No myalgias or right upper quadrant pain. Not planning on getting pregnant. Did respond quite well to treatment.  PMH: nonsmoker.   ROS see history of present illness  Objective  Physical Exam Vitals:   01/25/17 1501  BP: 118/80  Pulse: 70  Temp: 98.6 F (37 C)  SpO2: 98%    BP Readings from Last 3 Encounters:  01/25/17 118/80  01/21/17 130/82  01/07/17 122/86   Wt Readings from Last 3 Encounters:  01/25/17 173 lb (78.5 kg)  01/21/17 165 lb 8 oz (75.1 kg)  01/07/17 169 lb 6.4 oz (76.8 kg)    Physical Exam  Constitutional: No distress.  Cardiovascular: Normal rate, regular rhythm and normal heart sounds.   Pulmonary/Chest: Effort normal and breath sounds normal.  Musculoskeletal: She exhibits no edema.  Neurological: She is alert. Gait normal.  Skin: She is not diaphoretic.     Assessment/Plan: Please see individual problem list.  Essential hypertension Well-controlled. Suspect the hydrochlorothiazide contributing to her hypokalemia. We'll have her discontinue this and check at home and at work. Given goal of less than 140/90. If starts to run higher than that she'll let us know. Could consider adding back medication.  Hyperlipidemia Tolerating Crestor. Knows to avoid pregnancy with this medication.  Prediabetes Check A1c  Hypokalemia Recheck next week. Discontinue  hydrochlorothiazide.  Renal cortex thinning Noted on recent ultrasound. Renal function has been normal. We will check urine protein and recheck renal function.   Orders Placed This Encounter  Procedures  . Comp Met (CMET)    Standing Status:   Future    Standing Expiration Date:   01/25/2018  . HgB A1c    Standing Status:   Future    Standing Expiration Date:   01/25/2018  . Protein / creatinine ratio, urine    Standing Status:   Future    Standing Expiration Date:   01/25/2018  . POCT Urinalysis Dipstick    Standing Status:   Future    Standing Expiration Date:   01/25/2018    Tommi Rumps, MD [place]

## 2017-02-01 ENCOUNTER — Other Ambulatory Visit (INDEPENDENT_AMBULATORY_CARE_PROVIDER_SITE_OTHER): Payer: 59

## 2017-02-01 ENCOUNTER — Other Ambulatory Visit: Payer: Self-pay | Admitting: Family Medicine

## 2017-02-01 DIAGNOSIS — R7303 Prediabetes: Secondary | ICD-10-CM | POA: Diagnosis not present

## 2017-02-01 DIAGNOSIS — E876 Hypokalemia: Secondary | ICD-10-CM | POA: Diagnosis not present

## 2017-02-01 LAB — COMPREHENSIVE METABOLIC PANEL
ALT: 13 U/L (ref 0–35)
AST: 19 U/L (ref 0–37)
Albumin: 4.1 g/dL (ref 3.5–5.2)
Alkaline Phosphatase: 49 U/L (ref 39–117)
BILIRUBIN TOTAL: 0.5 mg/dL (ref 0.2–1.2)
BUN: 7 mg/dL (ref 6–23)
CHLORIDE: 104 meq/L (ref 96–112)
CO2: 28 meq/L (ref 19–32)
Calcium: 9.5 mg/dL (ref 8.4–10.5)
Creatinine, Ser: 0.72 mg/dL (ref 0.40–1.20)
GFR: 93.97 mL/min (ref >60.00)
GLUCOSE: 78 mg/dL (ref 70–99)
Potassium: 3.3 mEq/L — ABNORMAL LOW (ref 3.5–5.1)
Sodium: 140 mEq/L (ref 135–145)
Total Protein: 7.2 g/dL (ref 6.0–8.3)

## 2017-02-01 LAB — POCT URINALYSIS DIPSTICK
Bilirubin, UA: NEGATIVE
Glucose, UA: NEGATIVE
KETONES UA: NEGATIVE
Leukocytes, UA: NEGATIVE
Nitrite, UA: NEGATIVE
PROTEIN UA: NEGATIVE
Spec Grav, UA: <=1.005 — AB (ref 1.010–1.025)
UROBILINOGEN UA: 0.2 U/dL
pH, UA: 5 (ref 5.0–8.0)

## 2017-02-01 LAB — HEMOGLOBIN A1C: HEMOGLOBIN A1C: 5.7 % (ref 4.6–6.5)

## 2017-02-01 NOTE — Addendum Note (Signed)
Addended by: Leeanne Rio on: 02/01/2017 04:37 PM   Modules accepted: Orders

## 2017-02-01 NOTE — Addendum Note (Signed)
Addended by: Leeanne Rio on: 02/01/2017 04:38 PM   Modules accepted: Orders

## 2017-02-02 LAB — PROTEIN / CREATININE RATIO, URINE: Creatinine, Urine: 23 mg/dL (ref 20–275)

## 2017-02-02 LAB — URINALYSIS, MICROSCOPIC ONLY
BACTERIA UA: NONE SEEN /HPF
Hyaline Cast: NONE SEEN /LPF
RBC / HPF: NONE SEEN /HPF (ref 0–2)
SQUAMOUS EPITHELIAL / LPF: NONE SEEN /HPF (ref <=5)
WBC, UA: NONE SEEN /HPF (ref 0–5)

## 2017-02-13 ENCOUNTER — Other Ambulatory Visit: Payer: Self-pay

## 2017-02-13 NOTE — Addendum Note (Signed)
Addended by: Arby Barrette on: 02/13/2017 08:58 AM   Modules accepted: Orders

## 2017-02-18 ENCOUNTER — Other Ambulatory Visit (INDEPENDENT_AMBULATORY_CARE_PROVIDER_SITE_OTHER): Payer: 59

## 2017-02-18 DIAGNOSIS — E876 Hypokalemia: Secondary | ICD-10-CM | POA: Diagnosis not present

## 2017-02-18 LAB — POTASSIUM: POTASSIUM: 3.7 meq/L (ref 3.5–5.1)

## 2017-02-18 NOTE — Addendum Note (Signed)
Addended by: Arby Barrette on: 02/18/2017 11:31 AM   Modules accepted: Orders

## 2017-03-04 ENCOUNTER — Ambulatory Visit: Payer: 59 | Admitting: Physical Therapy

## 2017-03-12 ENCOUNTER — Ambulatory Visit: Payer: 59 | Attending: Urology | Admitting: Physical Therapy

## 2017-03-12 ENCOUNTER — Other Ambulatory Visit: Payer: Self-pay

## 2017-03-12 ENCOUNTER — Encounter: Payer: Self-pay | Admitting: Physical Therapy

## 2017-03-12 ENCOUNTER — Encounter: Payer: 59 | Admitting: Physical Therapy

## 2017-03-12 DIAGNOSIS — R278 Other lack of coordination: Secondary | ICD-10-CM | POA: Diagnosis not present

## 2017-03-12 DIAGNOSIS — M791 Myalgia, unspecified site: Secondary | ICD-10-CM

## 2017-03-12 DIAGNOSIS — R2689 Other abnormalities of gait and mobility: Secondary | ICD-10-CM

## 2017-03-12 NOTE — Patient Instructions (Signed)
Open book ( handout)  Deep core level 1   Standing posture: unlock knees , more weight bearing in ball mounds of feet , less in heels

## 2017-03-13 NOTE — Therapy (Signed)
[place] [facility] [address] [place], [place], [address] Phone: [phone]   Fax:  [phone]  Physical Therapy Evaluation  Patient Details  Name: Anna Kim MRN: [id number] Date of Birth: 11/19/1975 Referring Provider: Zara Council   Encounter Date: 03/12/2017  PT End of Session - 03/13/17 1658    Visit Number  1    Number of Visits  12    Date for PT Re-Evaluation  06/05/17    PT Start Time  1500    PT Stop Time  1603    PT Time Calculation (min)  63 min    Activity Tolerance  Patient tolerated treatment well    Behavior During Therapy  [facility] for tasks assessed/performed       Past Medical History:  Diagnosis Date  . HLD (hyperlipidemia)   . HTN (hypertension)   . Migraine   . Ureteral stone   . Vaginal Pap smear, abnormal    Remote history of 1 abnormal pap    Past Surgical History:  Procedure Laterality Date  . TONSILLECTOMY  2011    There were no vitals filed for this visit.   Subjective Assessment - 03/12/17 1508    Subjective  Pt noticed urinary leakage with coughing, laughing, sneezing, and jumping on trampoline with dtr. Pt wears a light pad all day long. This Sx started for the past years and has gotten a little bit worse.  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 In the past pt has done sit ups and crunches  but not currently. Pt plans to join the work gym and do cardio and weight lifting.  Bowel movements occur 1-2 days but does not need to strain.      Pertinent History  Gynecological: vaginal delivery with 3rd degree tear. Denied low back / pelvic pain. Pt has had plantar fasciitis which shots have helped relieve the Sx for the past 3 years.     Patient Stated Goals  Pt would like to do activiteis and not be concerned about leakage.          [facility] PT Assessment - 03/13/17 1657      Assessment   Medical Diagnosis  SUI    Referring Provider  Zara Council      Precautions   Precautions  None      Restrictions   Weight Bearing Restrictions  No      Balance Screen   Has the patient fallen in the past 6 months  No      Observation/Other Assessments   Observations  hyperextension of knees       Coordination   Gross Motor Movements are Fluid and Coordinated  -- chest breathing      AROM   Overall AROM Comments  WFL spinal movments      Strength   Overall Strength Comments  hip abd R 5/5       Palpation   SI assessment   L ASIS higher than R in supine. FADDIR and  hip flexion with L hip limited              Objective measurements completed on examination: See above findings.    Pelvic Floor Special Questions - 03/12/17 1554    Diastasis Recti  neg    Pelvic Floor Internal Exam  pt consented verbally without contraindications after being explained details of exam  Exam Type  Vaginal    Palpation  tenderness and scar restrictions at 2-3 rd layer at 4 o clock. Tightenss B at obt int     Strength  weak squeeze, no lift       [facility] Adult PT Treatment/Exercise - 03/13/17 1657      Neuro Re-ed    Neuro Re-ed Details   see pt instructions       Manual Therapy   Manual therapy comments  long axis distraction on R, MWM hip abd/ PA mob at L SIJ Grade III rotational to faciliate  more hip flexion     Internal Pelvic Floor  STM at B obt int, 2-3 rd layer at 4 oclock              PT Education - 03/13/17 1658    Education provided  Yes    Education Details  POC,anatomy/ physiology, goals, HEP    Person(s) Educated  Patient    Methods  Explanation;Demonstration;Verbal cues;Tactile cues    Comprehension  Verbalized understanding          PT Long Term Goals - 03/13/17 1651      PT LONG TERM GOAL #1   Title  Pt will decrease her UDI score from 54% to < 49% in order to restore pelvic floor function    Time  12    Period  Weeks    Status  New    Target Date  06/05/17       PT LONG TERM GOAL #2   Title  Pt will demo proper pelvic floor coordination with other deep core mm in order to regain pelvic floor ROM to minimize leakage    Time  2    Period  Weeks    Status  New    Target Date  03/27/17      PT LONG TERM GOAL #3   Title  Pt will demo no pelvic floor mm tensions across 2 visits in order to progress to deep core strengthening and fitness exercises     Time  8    Period  Weeks    Status  New    Target Date  05/08/17      PT LONG TERM GOAL #4   Title  Pt will demo proper co-activation of deep core and lower kinetic chain in fitenss routines in order to begin a gym routine withminimzie risks for injuries or relapse of Sx    Time  10    Period  Weeks    Status  New    Target Date  05/22/17      PT LONG TERM GOAL #5   Title  Pt will report minimal leakage while jumping on the trampoline for 30 sec x 3 reps in order play with her dtr    Time  12    Period  Weeks    Status  New    Target Date  06/05/17             Plan - 03/13/17 1658    Clinical Impression Statement  Pt is a 41 yo female who reports SUI without additional pelvic floor dysfunctions. This deficit impact her QOL and ability to play with her dtr. Pt's clinical presentations include pelvic obliquities, poor standing posture with hyperextension of knees,  dyscoordination and strength of pelvic floor mm, and poor body mechanics which places strain on the abdominal/pelvic floor mm. These are deficits that indicate an ineffective intraabdominal pressure system associated with  increased risk for urinary leakage. Additional contributing factors include 3rd degree perineal tear with her only vaginal delivery,  Performing heavy lifting at work, and having had plantar fasciitis in B feet in the past. A regional interdependent approach will yield greater benefits in pt's POC. Following Tx today, pt's pelvic alignment was corrected and pelvic floor tensions decreased.        Clinical Presentation   Evolving    Clinical Decision Making  Low    Rehab Potential  Good    PT Frequency  1x / week    PT Duration  12 weeks    PT Treatment/Interventions  Gait training;Balance training;Therapeutic exercise;Neuromuscular re-education;Stair training;Aquatic Therapy;Functional mobility training;Manual techniques;Patient/family education;Therapeutic activities;Moist Heat    Consulted and Agree with Plan of Care  Patient       Patient will benefit from skilled therapeutic intervention in order to improve the following deficits and impairments:  Improper body mechanics, Increased muscle spasms, Decreased strength, Decreased endurance, Decreased mobility, Decreased coordination, Postural dysfunction, Decreased safety awareness, Decreased range of motion, Impaired flexibility, Increased fascial restricitons  Visit Diagnosis: Myalgia  Other abnormalities of gait and mobility  Other lack of coordination     Problem List Patient Active Problem List   Diagnosis Date Noted  . Hypokalemia 01/25/2017  . 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 Encounter for health maintenance examination with abnormal findings 11/16/2016  . Essential hypertension 01/20/2016  . Prediabetes 01/20/2016  . Hyperlipidemia 01/20/2016    Jerl Mina ,PT, DPT, E-RYT   .  03/13/2017, 5:00 PM  [place], [place], [address] Phone: [phone]   Fax:  [phone]  Name: Anna Kim MRN: [id number] Date of Birth: 1975/09/29

## 2017-03-27 ENCOUNTER — Encounter: Payer: 59 | Admitting: Physical Therapy

## 2017-03-29 ENCOUNTER — Encounter: Payer: Self-pay | Admitting: Obstetrics and Gynecology

## 2017-03-29 ENCOUNTER — Ambulatory Visit (INDEPENDENT_AMBULATORY_CARE_PROVIDER_SITE_OTHER): Payer: 59 | Admitting: Obstetrics and Gynecology

## 2017-03-29 VITALS — BP 132/81 | HR 66 | Ht 66.0 in | Wt 174.0 lb

## 2017-03-29 DIAGNOSIS — Z01419 Encounter for gynecological examination (general) (routine) without abnormal findings: Secondary | ICD-10-CM | POA: Diagnosis not present

## 2017-03-29 MED ORDER — NORETHINDRONE ACET-ETHINYL EST 1.5-30 MG-MCG PO TABS: 1.0000 | ORAL_TABLET | Freq: Every day | ORAL | 4 refills | Status: DC

## 2017-03-29 NOTE — Progress Notes (Signed)
Subjective:   Anna Kim is a 42 y.o. G42P0 Caucasian female here for a routine well-woman exam.  Patient's last menstrual period was 03/27/2017.    Current complaints: none PCP: [place]       doesn't desire labs  Social History: Sexual: heterosexual Marital Status: divorced Living situation: with family Occupation: Radiology tech Tobacco/alcohol: no tobacco use Illicit drugs: no history of illicit drug use  The following portions of the patient's history were reviewed and updated as appropriate: allergies, current medications, past family history, past medical history, past social history, past surgical history and problem list.  Past Medical History Past Medical History:  Diagnosis Date  . HLD (hyperlipidemia)   . HTN (hypertension)   . Migraine   . Ureteral stone   . Vaginal Pap smear, abnormal    Remote history of 1 abnormal pap    Past Surgical History Past Surgical History:  Procedure Laterality Date  . TONSILLECTOMY  2011    Gynecologic History G1P0  Patient's last menstrual period was 03/27/2017. Contraception: OCP (estrogen/progesterone) Last Pap: 2017. Results were: normal Last mammogram: 05/2015. Results were: normal   Obstetric History OB History  Gravida Para Term Preterm AB Living  1         1  SAB TAB Ectopic Multiple Live Births          1    # Outcome Date GA Lbr Len/2nd Weight Sex Delivery Anes PTL Lv  1 Gravida 2006    F Vag-Spont   LIV      Current Medications Current Outpatient Medications on File Prior to Visit  Medication Sig Dispense Refill  . Calcium Carb-Cholecalciferol (CALCIUM + D3 PO) Take 1 tablet by mouth daily.    . Norethindrone Acetate-Ethinyl Estradiol (LOESTRIN 1.5/30, 21,) 1.5-30 MG-MCG tablet Take by mouth.    . Omega-3 Fatty Acids (FISH OIL) 1000 MG CAPS Take 1,000 mg by mouth daily.    . rosuvastatin (CRESTOR) 20 MG tablet Take 1 tablet (20 mg total) by mouth daily. 90 tablet 3  . cetirizine (ZYRTEC) 5 MG  tablet Take by mouth.    . hydrochlorothiazide (MICROZIDE) 12.5 MG capsule Take 1 capsule (12.5 mg total) by mouth daily. (Patient not taking: Reported on 03/12/2017) 90 capsule 3  . potassium chloride SA (K-DUR,KLOR-CON) 20 MEQ tablet Take 2 tablets (40 mEq total) by mouth daily. (Patient not taking: Reported on 03/12/2017) 4 tablet 0   No current facility-administered medications on file prior to visit.     Review of Systems Patient denies any headaches, blurred vision, shortness of breath, chest pain, abdominal pain, problems with bowel movements, urination, or intercourse.  Objective:  BP 132/81   Pulse 66   Ht 5\' 6"  (1.676 m)   Wt 174 lb (78.9 kg)   LMP 03/27/2017   BMI 28.08 kg/m  Physical Exam  General:  Well developed, well nourished, no acute distress. She is alert and oriented x3. Skin:  Warm and dry Neck:  Midline trachea, no thyromegaly or nodules Cardiovascular: Regular rate and rhythm, no murmur heard Lungs:  Effort normal, all lung fields clear to auscultation bilaterally Breasts:  No dominant palpable mass, retraction, or nipple discharge Abdomen:  Soft, non tender, no hepatosplenomegaly or masses Pelvic:  External genitalia is normal in appearance.  The vagina is normal in appearance. The cervix is bulbous, no CMT.  Thin prep pap is not done . Uterus is felt to be normal size, shape, and contour.  No adnexal masses or tenderness noted.  Extremities:  No swelling or varicosities noted Psych:  She has a normal mood and affect  Assessment:   Healthy well-woman exam  Plan:   F/U 1 year for AE, or sooner if needed Mammogram ordered  Gaetano Romberger Rockney Ghee, CNM

## 2017-03-29 NOTE — Patient Instructions (Signed)
Preventive Care 18-39 Years, Female Preventive care refers to lifestyle choices and visits with your health care provider that can promote health and wellness. What does preventive care include?  A yearly physical exam. This is also called an annual well check.  Dental exams once or twice a year.  Routine eye exams. Ask your health care provider how often you should have your eyes checked.  Personal lifestyle choices, including: ? Daily care of your teeth and gums. ? Regular physical activity. ? Eating a healthy diet. ? Avoiding tobacco and drug use. ? Limiting alcohol use. ? Practicing safe sex. ? Taking vitamin and mineral supplements as recommended by your health care provider. What happens during an annual well check? The services and screenings done by your health care provider during your annual well check will depend on your age, overall health, lifestyle risk factors, and family history of disease. Counseling Your health care provider may ask you questions about your:  Alcohol use.  Tobacco use.  Drug use.  Emotional well-being.  Home and relationship well-being.  Sexual activity.  Eating habits.  Work and work Statistician.  Method of birth control.  Menstrual cycle.  Pregnancy history.  Screening You may have the following tests or measurements:  Height, weight, and BMI.  Diabetes screening. This is done by checking your blood sugar (glucose) after you have not eaten for a while (fasting).  Blood pressure.  Lipid and cholesterol levels. These may be checked every 5 years starting at age 38.  Skin check.  Hepatitis C blood test.  Hepatitis B blood test.  Sexually transmitted disease (STD) testing.  BRCA-related cancer screening. This may be done if you have a family history of breast, ovarian, tubal, or peritoneal cancers.  Pelvic exam and Pap test. This may be done every 3 years starting at age 38. Starting at age 30, this may be done  every 5 years if you have a Pap test in combination with an HPV test.  Discuss your test results, treatment options, and if necessary, the need for more tests with your health care provider. Vaccines Your health care provider may recommend certain vaccines, such as:  Influenza vaccine. This is recommended every year.  Tetanus, diphtheria, and acellular pertussis (Tdap, Td) vaccine. You may need a Td booster every 10 years.  Varicella vaccine. You may need this if you have not been vaccinated.  HPV vaccine. If you are 39 or younger, you may need three doses over 6 months.  Measles, mumps, and rubella (MMR) vaccine. You may need at least one dose of MMR. You may also need a second dose.  Pneumococcal 13-valent conjugate (PCV13) vaccine. You may need this if you have certain conditions and were not previously vaccinated.  Pneumococcal polysaccharide (PPSV23) vaccine. You may need one or two doses if you smoke cigarettes or if you have certain conditions.  Meningococcal vaccine. One dose is recommended if you are age 68-21 years and a first-year college student living in a residence hall, or if you have one of several medical conditions. You may also need additional booster doses.  Hepatitis A vaccine. You may need this if you have certain conditions or if you travel or work in places where you may be exposed to hepatitis A.  Hepatitis B vaccine. You may need this if you have certain conditions or if you travel or work in places where you may be exposed to hepatitis B.  Haemophilus influenzae type b (Hib) vaccine. You may need this  if you have certain risk factors.  Talk to your health care provider about which screenings and vaccines you need and how often you need them. This information is not intended to replace advice given to you by your health care provider. Make sure you discuss any questions you have with your health care provider. Document Released: 05/08/2001 Document Revised:  11/30/2015 Document Reviewed: 01/11/2015 Elsevier Interactive Patient Education  2018 Elsevier Inc.  

## 2017-04-01 ENCOUNTER — Ambulatory Visit: Payer: 59 | Attending: Urology | Admitting: Physical Therapy

## 2017-04-01 DIAGNOSIS — M791 Myalgia, unspecified site: Secondary | ICD-10-CM | POA: Diagnosis not present

## 2017-04-01 DIAGNOSIS — R278 Other lack of coordination: Secondary | ICD-10-CM | POA: Insufficient documentation

## 2017-04-01 DIAGNOSIS — R2689 Other abnormalities of gait and mobility: Secondary | ICD-10-CM | POA: Diagnosis not present

## 2017-04-01 NOTE — Therapy (Signed)
[place] MAIN [facility] SERVICES [address] [place], [place], [address] Phone: [phone]   Fax:  [phone]  Physical Therapy Treatment  Patient Details  Name: Anna Kim MRN: [id number] Date of Birth: 1976-01-31 Referring Provider: Zara Council   Encounter Date: 04/01/2017  PT End of Session - 04/01/17 1808    Visit Number  2    Number of Visits  12    Date for PT Re-Evaluation  06/05/17    PT Start Time  3614    PT Stop Time  1755    PT Time Calculation (min)  45 min    Activity Tolerance  Patient tolerated treatment well    Behavior During Therapy  [facility] for tasks assessed/performed       Past Medical History:  Diagnosis Date  . HLD (hyperlipidemia)   . HTN (hypertension)   . Migraine   . Ureteral stone   . Vaginal Pap smear, abnormal    Remote history of 1 abnormal pap    Past Surgical History:  Procedure Laterality Date  . TONSILLECTOMY  2011    There were no vitals filed for this visit.  Subjective Assessment - 04/01/17 1713    Subjective  Pt reported she is not sure if she is doing her HEP correctly.     Pertinent History  Gynecological: vaginal delivery with 3rd degree tear. Denied low back / pelvic pain. Pt has had plantar fasciitis which shots have helped relieve the Sx for the past 3 years.     Patient Stated Goals  Pt would like to do activiteis and not be concerned about leakage.          [facility] PT Assessment - 04/01/17 1747      Palpation   SI assessment   ASIS equally aligned                Pelvic Floor Special Questions - 04/01/17 1713    Diastasis Recti  neg    Pelvic Floor Internal Exam  pt consented verbally without contraindications after being explained details of exam     Exam Type  Vaginal    Palpation  tenderness and scar restrictions iliococcygeus L, ischioanal fossa, externally at obturator tubercle/ischial tuberosity L     Strength  -- improved pelvic floor lengthening          [facility] Adult PT Treatment/Exercise - 04/01/17 1746      Neuro Re-ed    Neuro Re-ed Details   see pt instructions       Moist Heat Therapy   Number Minutes Moist Heat  5 Minutes    Moist Heat Location  -- perineum ( towel wrapped)       Manual Therapy   Manual therapy comments  isdelying at ischial tuberosity/ obturator tubercle  L     Internal Pelvic Floor  thiele massage/ MWM at probelm areas                   PT Long Term Goals - 03/13/17 1651      PT LONG TERM GOAL #1   Title  Pt will decrease her UDI score from 54% to < 49% in order to restore pelvic floor function    Time  12    Period  Weeks    Status  New    Target Date  06/05/17      PT LONG TERM GOAL #2   Title  Pt will demo proper pelvic floor  coordination with other deep core mm in order to regain pelvic floor ROM to minimize leakage    Time  2    Period  Weeks    Status  New    Target Date  03/27/17      PT LONG TERM GOAL #3   Title  Pt will demo no pelvic floor mm tensions across 2 visits in order to progress to deep core strengthening and fitness exercises     Time  8    Period  Weeks    Status  New    Target Date  05/08/17      PT LONG TERM GOAL #4   Title  Pt will demo proper co-activation of deep core and lower kinetic chain in fitenss routines in order to begin a gym routine withminimzie risks for injuries or relapse of Sx    Time  10    Period  Weeks    Status  New    Target Date  05/22/17      PT LONG TERM GOAL #5   Title  Pt will report minimal leakage while jumping on the trampoline for 30 sec x 3 reps in order play with her dtr    Time  12    Period  Weeks    Status  New    Target Date  06/05/17            Plan - 04/01/17 1808    Clinical Impression Statement  Pt demo'd good carry over with improved pelvic alignment. Pt showed decreased pelvic floor mm tensions post Tx with remaining tensions on L ischiococcgyeus and externally at L ischioanal fossa/ obturator  tubercle/ ischiotuberosity.  Pt tolerated today's Tx without complaints. Pt required cueing for proper breathing and deep core activation in addition to correctly hyperextension of knees in standing position. Pt continues to benefits from skilled PT.     Rehab Potential  Good    PT Frequency  1x / week    PT Duration  12 weeks    PT Treatment/Interventions  Gait training;Balance training;Therapeutic exercise;Neuromuscular re-education;Stair training;Aquatic Therapy;Functional mobility training;Manual techniques;Patient/family education;Therapeutic activities;Moist Heat    Consulted and Agree with Plan of Care  Patient       Patient will benefit from skilled therapeutic intervention in order to improve the following deficits and impairments:  Improper body mechanics, Increased muscle spasms, Decreased strength, Decreased endurance, Decreased mobility, Decreased coordination, Postural dysfunction, Decreased safety awareness, Decreased range of motion, Impaired flexibility, Increased fascial restricitons  Visit Diagnosis: Myalgia  Other abnormalities of gait and mobility  Other lack of coordination     Problem List Patient Active Problem List   Diagnosis Date Noted  . Hypokalemia 01/25/2017  . 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 Encounter for health maintenance examination with abnormal findings 11/16/2016  . Essential hypertension 01/20/2016  . Prediabetes 01/20/2016  . Hyperlipidemia 01/20/2016    Jerl Mina ,PT, DPT, E-RYT  04/01/2017, 6:09 PM  [place] MAIN [facility] SERVICES [address] [place], [place], [address] Phone: [phone]   Fax:  [phone]  Name: Anna Kim MRN: [id number] Date of Birth: Oct 29, 1975

## 2017-04-15 ENCOUNTER — Ambulatory Visit: Payer: 59 | Admitting: Physical Therapy

## 2017-04-15 DIAGNOSIS — R278 Other lack of coordination: Secondary | ICD-10-CM | POA: Diagnosis not present

## 2017-04-15 DIAGNOSIS — M791 Myalgia, unspecified site: Secondary | ICD-10-CM | POA: Diagnosis not present

## 2017-04-15 DIAGNOSIS — R2689 Other abnormalities of gait and mobility: Secondary | ICD-10-CM

## 2017-04-15 NOTE — Therapy (Signed)
[place] MAIN [facility] SERVICES [address] [place], [place], [address] Phone: [phone]   Fax:  [phone]  Physical Therapy Treatment  Patient Details  Name: LENICE KOPER MRN: [id number] Date of Birth: [birth year] Referring Provider: Zara Council   Encounter Date: 04/15/2017  PT End of Session - 04/15/17 1743    Visit Number  3    Number of Visits  12    Date for PT Re-Evaluation  06/05/17    PT Start Time  1700    PT Stop Time  1740    PT Time Calculation (min)  40 min    Activity Tolerance  Patient tolerated treatment well    Behavior During Therapy  [facility] for tasks assessed/performed       Past Medical History:  Diagnosis Date  . HLD (hyperlipidemia)   . HTN (hypertension)   . Migraine   . Ureteral stone   . Vaginal Pap smear, abnormal    Remote history of 1 abnormal pap    Past Surgical History:  Procedure Laterality Date  . TONSILLECTOMY  2011    There were no vitals filed for this visit.  Subjective Assessment - 04/15/17 1658    Subjective  Pt reports she has been noticing when she locks her knees. Pt finds the deep core exercises to be difficult.     Pertinent History  Gynecological: vaginal delivery with 3rd degree tear. Denied low back / pelvic pain. Pt has had plantar fasciitis which shots have helped relieve the Sx for the past 3 years.     Patient Stated Goals  Pt would like to do activiteis and not be concerned about leakage.                    Pelvic Floor Special Questions - 04/15/17 1725    Diastasis Recti  neg    Pelvic Floor Internal Exam  pt consented verbally without contraindications after being explained details of exam     Exam Type  Vaginal    Palpation  tenderness and scar restriction9 and 3 o clock with      Strength  -- improved pelvic floor lengthening         [facility] Adult PT Treatment/Exercise - 04/15/17 1726      Neuro Re-ed    Neuro Re-ed Details   see pt  instructions       Manual Therapy   Internal Pelvic Floor  thiele massage/ MWM at probelm areas                   PT Long Term Goals - 04/15/17 1745      PT LONG TERM GOAL #1   Title  Pt will decrease her UDI score from 54% to < 49% in order to restore pelvic floor function    Time  12    Period  Weeks    Status  On-going      PT LONG TERM GOAL #2   Title  Pt will demo proper pelvic floor coordination with other deep core mm in order to regain pelvic floor ROM to minimize leakage    Time  2    Period  Weeks    Status  Partially Met      PT LONG TERM GOAL #3   Title  Pt will demo no pelvic floor mm tensions across 2 visits in order to progress to deep core strengthening and fitness exercises     Time  8    Period  Weeks    Status  Partially Met      PT LONG TERM GOAL #4   Title  Pt will demo proper co-activation of deep core and lower kinetic chain in fitenss routines in order to begin a gym routine withminimzie risks for injuries or relapse of Sx    Time  10    Period  Weeks    Status  Partially Met      PT LONG TERM GOAL #5   Title  Pt will report minimal leakage while jumping on the trampoline for 30 sec x 3 reps in order play with her dtr    Time  12    Period  Weeks    Status  On-going            Plan - 04/15/17 1743    Clinical Impression Statement  Pt is progressing well with decreasing scar restrictions at posterior pelvic floor mm. Pt was initiated to pelvic floor stretches and was provided education on how to utilize these stretches post workout when she returns to the gym. Future sessions will address alignment and tehcnique in fitness and gym activities. Pt continues to benefit from skilled PT    Rehab Potential  Good    PT Frequency  1x / week    PT Duration  12 weeks    PT Treatment/Interventions  Gait training;Balance training;Therapeutic exercise;Neuromuscular re-education;Stair training;Aquatic Therapy;Functional mobility training;Manual  techniques;Patient/family education;Therapeutic activities;Moist Heat    Consulted and Agree with Plan of Care  Patient       Patient will benefit from skilled therapeutic intervention in order to improve the following deficits and impairments:  Improper body mechanics, Increased muscle spasms, Decreased strength, Decreased endurance, Decreased mobility, Decreased coordination, Postural dysfunction, Decreased safety awareness, Decreased range of motion, Impaired flexibility, Increased fascial restricitons  Visit Diagnosis: Myalgia  Other lack of coordination  Other abnormalities of gait and mobility     Problem List Patient Active Problem List   Diagnosis Date Noted  . Hypokalemia 01/25/2017  . 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 Encounter for health maintenance examination with abnormal findings 11/16/2016  . Essential hypertension 01/20/2016  . Prediabetes 01/20/2016  . Hyperlipidemia 01/20/2016    Jerl Mina ,PT, DPT, E-RYT  04/15/2017, 5:46 PM  [place] MAIN [facility] SERVICES [address] [place], [place], [address] Phone: [phone]   Fax:  [phone]  Name: BREYONA SWANDER MRN: [id number] Date of Birth: [birth year]

## 2017-04-15 NOTE — Patient Instructions (Addendum)
  10x 3  Frog stretch: laying on belly with pillow under hips, knees bent, inhale do nothing, exhale let ankles fall apart      5 reps   Figure -4 stretch, pull towel in with exhale, thigh in towards chest     5 reps   Thigh over thigh  pull towel in with exhale, thigh in towards chest      5 reps    low lunge with feet flat on a step stool at work   Reach higher with the hand on the asame side as  Your back foot Knees above ankle,  Ground down with back foot  To feel the stretch in the groin      10 reps  childs poses rocking with pillow under belly   Toes tucked, shoulders downa nd back, paws grip , hands shoulder width apart

## 2017-04-16 ENCOUNTER — Encounter: Payer: Self-pay | Admitting: Family Medicine

## 2017-04-17 ENCOUNTER — Telehealth: Payer: Self-pay | Admitting: Family Medicine

## 2017-04-17 DIAGNOSIS — E785 Hyperlipidemia, unspecified: Secondary | ICD-10-CM

## 2017-04-17 MED ORDER — PRAVASTATIN SODIUM 40 MG PO TABS: 40.0000 mg | ORAL_TABLET | Freq: Every day | ORAL | 3 refills | Status: DC

## 2017-04-17 NOTE — Telephone Encounter (Signed)
Patient notified and scheduled 

## 2017-04-17 NOTE — Telephone Encounter (Signed)
Noted.  I will send pravastatin for her to try.  If she has joint aches or muscle aches she should let us know.  We should plan on rechecking labs in about a month.  Orders placed.  Thanks.

## 2017-04-17 NOTE — Telephone Encounter (Signed)
Copied from [place] [id number]. Topic: Quick Communication - See Telephone Encounter >> Apr 17, 2017  3:16 PM Synthia Innocent wrote: CRM for notification. See Telephone encounter for: Crestor was making her joints hurt, she did stop for 2 days and did not have any joint pain. Could Dr Josephina Gip prescribe something else?  04/17/17.

## 2017-04-17 NOTE — Telephone Encounter (Signed)
Please advise 

## 2017-04-29 ENCOUNTER — Ambulatory Visit: Payer: 59 | Attending: Urology | Admitting: Physical Therapy

## 2017-04-29 DIAGNOSIS — M791 Myalgia, unspecified site: Secondary | ICD-10-CM | POA: Insufficient documentation

## 2017-04-29 DIAGNOSIS — R278 Other lack of coordination: Secondary | ICD-10-CM | POA: Diagnosis not present

## 2017-04-29 DIAGNOSIS — R2689 Other abnormalities of gait and mobility: Secondary | ICD-10-CM | POA: Insufficient documentation

## 2017-04-29 NOTE — Patient Instructions (Addendum)
Practice proper pelvic floor coordination  Inhale: expand pelvic floor mm Exhale" "j" scoop, allow pelvic floor to close, lift first before belly sinks   ( not "draw abdominal muscle to spine" or strain with abdominal muscles")   _________   Strengthening back and leg muscles: thoracolumbar system    Bridging series w/ resistive band other side of doorknob:  Level 1:  Position:  Elbows bent, knees hip width apart, heels under knees on top of stable  foot stool   Stabilization points: shoulders, upper arms, back of head pressed into floor. Heel press downward.   Movement: inhale do nothing, exhale pull band by side, lower fists to floor completely while lifting hips.Keep stabilization points engaged when you allow the band to go back to starting position  10 x 2 reps       Level 2:  Position:  Elbows straight, arms raised to ceiling at shoulder height, knees apart like a ballerina,heels together, heels under knees, on top of stable  foot stool   Stabilization points: shoulders, upper arms, back of head pressed into floor. Heel press downward.   Movement: inhale do nothing, exhale pull band by side, lower fists to floor completely while lifting hips. Keep stabilization points engaged when you allow the band to go back to starting position   10 x 2 reps  Shoulder training: Try to imagine you are squeezing a pencil under your armpit and your shoulder blades are down away from your ears and towards each other     __________  Spinal movements throughout the   6 directions of the spine :  3 reps   ___________  tricep dips with knees, hip width apart, feet under knees Dip before the point of shakiness  8 reps

## 2017-04-29 NOTE — Therapy (Signed)
[place] MAIN [facility] SERVICES [address] [place], [place], [address] Phone: [phone]   Fax:  [phone]  Physical Therapy Treatment  Patient Details  Name: Anna Kim MRN: [id number] Date of Birth: 1975-11-18 Referring Provider: Zara Council   Encounter Date: 04/29/2017  PT End of Session - 04/29/17 1704    Visit Number  4    Number of Visits  12    Date for PT Re-Evaluation  06/05/17    PT Start Time  1703    PT Stop Time  1807    PT Time Calculation (min)  64 min    Activity Tolerance  Patient tolerated treatment well    Behavior During Therapy  [facility] for tasks assessed/performed       Past Medical History:  Diagnosis Date  . HLD (hyperlipidemia)   . HTN (hypertension)   . Migraine   . Ureteral stone   . Vaginal Pap smear, abnormal    Remote history of 1 abnormal pap    Past Surgical History:  Procedure Laterality Date  . TONSILLECTOMY  2011    There were no vitals filed for this visit.  Subjective Assessment - 04/29/17 1703    Subjective  Pt had no problems with her exercises     Pertinent History  Gynecological: vaginal delivery with 3rd degree tear. Denied low back / pelvic pain. Pt has had plantar fasciitis which shots have helped relieve the Sx for the past 3 years.     Patient Stated Goals  Pt would like to do activiteis and not be concerned about leakage.          [facility] PT Assessment - 04/29/17 1757      Strength   Overall Strength Comments  low trap , retraction B 3/5 ,  posterior sling: glut strength 4/5, opp UE scaption 3/5 with spinal pertubations B                Pelvic Floor Special Questions - 04/29/17 1738    Diastasis Recti  neg    Pelvic Floor Internal Exam  pt consented verbally without contraindications after being explained details of exam     Exam Type  Vaginal    Palpation  no tenderness/ tensions of scar restriction9 and 3 o clock . Pt demo'd increased urethral compressor  mm tightness B and lowered position of urethra and bladder wall with overuse of oblique mm during cue for pelvic floor contraction. ( post Tx: more cranial movement of pelvic floor)     Strength  weak squeeze, no lift        [facility] Adult PT Treatment/Exercise - 04/29/17 1740      Neuro Re-ed    Neuro Re-ed Details   see pt instructions       Moist Heat Therapy   Number Minutes Moist Heat  5 Minutes    Moist Heat Location  -- perineum ( towel wrapped)       Manual Therapy   Internal Pelvic Floor  thiele massage/ MWM at probelm areas              PT Education - 04/29/17 1759    Education provided  Yes    Education Details  HEP    Person(s) Educated  Patient    Methods  Explanation;Demonstration;Tactile cues;Verbal cues;Handout    Comprehension  Verbalized understanding;Returned demonstration          PT Long Term Goals - 04/29/17 1807  PT LONG TERM GOAL #1   Title  Pt will decrease her UDI score from 54% to < 49% in order to restore pelvic floor function    Time  12    Period  Weeks    Status  On-going      PT LONG TERM GOAL #2   Title  Pt will demo proper pelvic floor coordination with other deep core mm in order to regain pelvic floor ROM to minimize leakage    Time  2    Period  Weeks    Status  Partially Met      PT LONG TERM GOAL #3   Title  Pt will demo no pelvic floor mm tensions across 2 visits in order to progress to deep core strengthening and fitness exercises     Time  8    Period  Weeks    Status  Achieved      PT LONG TERM GOAL #4   Title  Pt will demo proper co-activation of deep core and lower kinetic chain in fitenss routines in order to begin a gym routine withminimzie risks for injuries or relapse of Sx    Time  10    Period  Weeks    Status  Partially Met      PT LONG TERM GOAL #5   Title  Pt will report minimal leakage while jumping on the trampoline for 30 sec x 3 reps in order play with her dtr    Time  12    Period  Weeks     Status  On-going      Additional Long Term Goals   Additional Long Term Goals  Yes      PT LONG TERM GOAL #6   Title  Pt will demo a more cranial position of urethra/bladder behind pbuc symphysis across 2 visits in order to advance to upright fitness exercises and plyometrics to exercise with dtr     Time  8    Period  Weeks    Status  New    Target Date  06/24/17      PT LONG TERM GOAL #7   Title  Pt will demo increased low trap/retraction mm strength from 3/5 to >4/5 B and no spinal pertubation with BLE/ scaption MMT in order to perform repeated lifting and pt handling on the job w/ less risk for injuries     Time  12    Period  Weeks    Status  New    Target Date  07/22/17            Plan - 04/29/17 1704    Clinical Impression Statement  Pt show no more scar restrictions in the pelvic floor mm but displayed a lowered urethra and baldder wall behind the pubic symphysis. Pt demo'd improved cranial movement of pelvic floor mm following Tx. Advanced pt to thoracolumbar strengthening due to weakness in posterior back mm.  Pt demo'd correctly. Pt continues to benefit from skilled PT     Rehab Potential  Good    PT Frequency  1x / week    PT Duration  12 weeks    PT Treatment/Interventions  Gait training;Balance training;Therapeutic exercise;Neuromuscular re-education;Stair training;Aquatic Therapy;Functional mobility training;Manual techniques;Patient/family education;Therapeutic activities;Moist Heat    Consulted and Agree with Plan of Care  Patient       Patient will benefit from skilled therapeutic intervention in order to improve the following deficits and impairments:  Improper body mechanics, Increased muscle  spasms, Decreased strength, Decreased endurance, Decreased mobility, Decreased coordination, Postural dysfunction, Decreased safety awareness, Decreased range of motion, Impaired flexibility, Increased fascial restricitons  Visit Diagnosis: Myalgia  Other lack of  coordination  Other abnormalities of gait and mobility     Problem List Patient Active Problem List   Diagnosis Date Noted  . Hypokalemia 01/25/2017  . 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 Encounter for health maintenance examination with abnormal findings 11/16/2016  . Essential hypertension 01/20/2016  . Prediabetes 01/20/2016  . Hyperlipidemia 01/20/2016    Jerl Mina ,PT, DPT, E-RYT   04/29/2017, 6:10 PM  [place] MAIN [facility] SERVICES [address] [place], [place], [address] Phone: [phone]   Fax:  [phone]  Name: Anna Kim MRN: [id number] Date of Birth: 1975/09/12

## 2017-04-30 ENCOUNTER — Other Ambulatory Visit: Payer: Self-pay

## 2017-04-30 ENCOUNTER — Ambulatory Visit: Payer: 59 | Admitting: Family Medicine

## 2017-04-30 ENCOUNTER — Encounter: Payer: Self-pay | Admitting: Family Medicine

## 2017-04-30 DIAGNOSIS — Z8679 Personal history of other diseases of the circulatory system: Secondary | ICD-10-CM

## 2017-04-30 DIAGNOSIS — Q639 Congenital malformation of kidney, unspecified: Secondary | ICD-10-CM | POA: Diagnosis not present

## 2017-04-30 DIAGNOSIS — R7303 Prediabetes: Secondary | ICD-10-CM | POA: Diagnosis not present

## 2017-04-30 DIAGNOSIS — E876 Hypokalemia: Secondary | ICD-10-CM | POA: Diagnosis not present

## 2017-04-30 DIAGNOSIS — E785 Hyperlipidemia, unspecified: Secondary | ICD-10-CM | POA: Diagnosis not present

## 2017-04-30 NOTE — Progress Notes (Signed)
  Tommi Rumps, MD Phone: [phone]  Anna Kim is a 42 y.o. female who presents today for follow-up.  Prediabetes: She has had the idea to eat more vegetables and salads though has not really been eating much of a different diet.  Not much fast food.  She plans to start going to the gym though has not done this yet.  Hyperlipidemia: Taking Crestor previously though developed arthralgias.  Notes those resolved with switching to pravastatin.  She notes no myalgias or right rotation of getting pregnant.  Blood pressure has been elevated in the past and she was on HCTZ though that was causing hypokalemia.  She has come off of that and since then her blood pressure has been running around 120/70.  She notes no chest pain, shortness of breath, or edema.  Social History   Tobacco Use  Smoking Status Never Smoker  Smokeless Tobacco Never Used     ROS see history of present illness  Objective  Physical Exam Vitals:   04/30/17 1558  BP: 118/78  Pulse: 70  Temp: 98.1 F (36.7 C)  SpO2: 97%    BP Readings from Last 3 Encounters:  04/30/17 118/78  03/29/17 132/81  01/25/17 118/80   Wt Readings from Last 3 Encounters:  04/30/17 176 lb (79.8 kg)  03/29/17 174 lb (78.9 kg)  01/25/17 173 lb (78.5 kg)    Physical Exam  Constitutional: No distress.  Cardiovascular: Normal rate, regular rhythm and normal heart sounds.  Pulmonary/Chest: Effort normal and breath sounds normal.  Musculoskeletal: She exhibits no edema.  Neurological: She is alert. Gait normal.  Skin: Skin is warm and dry. She is not diaphoretic.     Assessment/Plan: Please see individual problem list.  History of hypertension Blood pressures done well since coming off of HCTZ.  She will continue to periodically monitor.  Prediabetes Encouraged diet and exercise.  Plan to check A1c at next visit.  Renal cortex thinning No proteinuria.  Renal function has been stable and adequate.  Ultrasound finding  possibly related to kidney stone that she previously had.  We will periodically monitor her renal function.  Hypokalemia Resolved with discontinuing HCTZ.  Hyperlipidemia She will return later this month for fasting lab work following her switch to pravastatin.   No orders of the defined types were placed in this encounter.   No orders of the defined types were placed in this encounter.    Tommi Rumps, MD [place]

## 2017-04-30 NOTE — Assessment & Plan Note (Signed)
Blood pressures done well since coming off of HCTZ.  She will continue to periodically monitor.

## 2017-04-30 NOTE — Assessment & Plan Note (Signed)
No proteinuria.  Renal function has been stable and adequate.  Ultrasound finding possibly related to kidney stone that she previously had.  We will periodically monitor her renal function.

## 2017-04-30 NOTE — Assessment & Plan Note (Signed)
Encouraged diet and exercise.  Plan to check A1c at next visit.

## 2017-04-30 NOTE — Assessment & Plan Note (Signed)
Resolved with discontinuing HCTZ.

## 2017-04-30 NOTE — Patient Instructions (Signed)
Nice to see you. Please to work on diet and exercise. We will check lab work as planned and call you with the results.

## 2017-04-30 NOTE — Assessment & Plan Note (Signed)
She will return later this month for fasting lab work following her switch to pravastatin.

## 2017-05-07 ENCOUNTER — Other Ambulatory Visit: Payer: Self-pay | Admitting: Obstetrics and Gynecology

## 2017-05-07 DIAGNOSIS — Z1231 Encounter for screening mammogram for malignant neoplasm of breast: Secondary | ICD-10-CM

## 2017-05-13 ENCOUNTER — Ambulatory Visit: Payer: 59 | Admitting: Physical Therapy

## 2017-05-14 ENCOUNTER — Ambulatory Visit: Payer: 59 | Admitting: Physical Therapy

## 2017-05-16 DIAGNOSIS — H5213 Myopia, bilateral: Secondary | ICD-10-CM | POA: Diagnosis not present

## 2017-05-16 DIAGNOSIS — H52223 Regular astigmatism, bilateral: Secondary | ICD-10-CM | POA: Diagnosis not present

## 2017-05-20 ENCOUNTER — Ambulatory Visit: Payer: 59 | Admitting: Physical Therapy

## 2017-05-20 DIAGNOSIS — R2689 Other abnormalities of gait and mobility: Secondary | ICD-10-CM | POA: Diagnosis not present

## 2017-05-20 DIAGNOSIS — R278 Other lack of coordination: Secondary | ICD-10-CM | POA: Diagnosis not present

## 2017-05-20 DIAGNOSIS — M791 Myalgia, unspecified site: Secondary | ICD-10-CM

## 2017-05-20 NOTE — Therapy (Signed)
[place] MAIN [facility] SERVICES [address] [place], [place], [address] Phone: [phone]   Fax:  [phone]  Physical Therapy Treatment  Patient Details  Name: Anna Kim MRN: [id number] Date of Birth: [birth year] Referring Provider: Zara Council   Encounter Date: 05/20/2017  PT End of Session - 05/20/17 1649    Visit Number  5    Number of Visits  12    Date for PT Re-Evaluation  06/05/17    PT Start Time  8206    PT Stop Time  1651    PT Time Calculation (min)  58 min    Activity Tolerance  Patient tolerated treatment well    Behavior During Therapy  [facility] for tasks assessed/performed       Past Medical History:  Diagnosis Date  . HLD (hyperlipidemia)   . HTN (hypertension)   . Migraine   . Ureteral stone   . Vaginal Pap smear, abnormal    Remote history of 1 abnormal pap    Past Surgical History:  Procedure Laterality Date  . TONSILLECTOMY  2011    There were no vitals filed for this visit.  Subjective Assessment - 05/20/17 1556    Subjective  Pt still notices leakage with sneezing     Pertinent History  Gynecological: vaginal delivery with 3rd degree tear. Denied low back / pelvic pain. Pt has had plantar fasciitis which shots have helped relieve the Sx for the past 3 years.     Patient Stated Goals  Pt would like to do activiteis and not be concerned about leakage.          [facility] PT Assessment - 05/20/17 1658      Observation/Other Assessments   Observations  poor propioception of feet, weighbearing equally on single leg instead equally               Pelvic Floor Special Questions - 05/20/17 1625    Diastasis Recti  neg    Pelvic Floor Internal Exam  pt consented verbally without contraindications after being explained details of exam     Exam Type  Vaginal    Palpation  tenderness/tensions at L urethra compressae and anterior obt int      Strength  fair squeeze, definite lift 3-/5 less oblique  mm but require more cues        [facility] Adult PT Treatment/Exercise - 05/20/17 1626      Neuro Re-ed    Neuro Re-ed Details   see pt instructions       Manual Therapy   Internal Pelvic Floor  thiele massage at problem areas with coordinated breathing              PT Education - 05/20/17 1648    Education provided  Yes    Education Details  HEP    Person(s) Educated  Patient    Methods  Explanation;Demonstration;Tactile cues;Handout;Verbal cues    Comprehension  Returned demonstration;Verbalized understanding          PT Long Term Goals - 04/29/17 1807      PT LONG TERM GOAL #1   Title  Pt will decrease her UDI score from 54% to < 49% in order to restore pelvic floor function    Time  12    Period  Weeks    Status  On-going      PT LONG TERM GOAL #2   Title  Pt will demo proper pelvic floor coordination with other deep  core mm in order to regain pelvic floor ROM to minimize leakage    Time  2    Period  Weeks    Status  Partially Met      PT LONG TERM GOAL #3   Title  Pt will demo no pelvic floor mm tensions across 2 visits in order to progress to deep core strengthening and fitness exercises     Time  8    Period  Weeks    Status  Achieved      PT LONG TERM GOAL #4   Title  Pt will demo proper co-activation of deep core and lower kinetic chain in fitenss routines in order to begin a gym routine withminimzie risks for injuries or relapse of Sx    Time  10    Period  Weeks    Status  Partially Met      PT LONG TERM GOAL #5   Title  Pt will report minimal leakage while jumping on the trampoline for 30 sec x 3 reps in order play with her dtr    Time  12    Period  Weeks    Status  On-going      Additional Long Term Goals   Additional Long Term Goals  Yes      PT LONG TERM GOAL #6   Title  Pt will demo a more cranial position of urethra/bladder behind pbuc symphysis across 2 visits in order to advance to upright fitness exercises and plyometrics to  exercise with dtr     Time  8    Period  Weeks    Status  New    Target Date  06/24/17      PT LONG TERM GOAL #7   Title  Pt will demo increased low trap/retraction mm strength from 3/5 to >4/5 B and no spinal pertubation with BLE/ scaption MMT in order to perform repeated lifting and pt handling on the job w/ less risk for injuries     Time  12    Period  Weeks    Status  New    Target Date  07/22/17            Plan - 05/20/17 1654    Clinical Impression Statement  Pt is progressing well with remaining pelvic floor tensions located at L urethra compressae and anterior obturator internus. This area decreased in tenderness and tensions post Tx. Pt demo'd improved pelvic floor coordination without overuse of oblique mm. Progressed with propioception training for weight shifts and equal weightbearing in patient handling body mechanics and new strengthening exercises. Pt demo'd form and technique following Tx. Pt continues to benefit from skilled PT.      Rehab Potential  Good    PT Frequency  1x / week    PT Duration  12 weeks    PT Treatment/Interventions  Gait training;Balance training;Therapeutic exercise;Neuromuscular re-education;Stair training;Aquatic Therapy;Functional mobility training;Manual techniques;Patient/family education;Therapeutic activities;Moist Heat    Consulted and Agree with Plan of Care  Patient       Patient will benefit from skilled therapeutic intervention in order to improve the following deficits and impairments:  Improper body mechanics, Increased muscle spasms, Decreased strength, Decreased endurance, Decreased mobility, Decreased coordination, Postural dysfunction, Decreased safety awareness, Decreased range of motion, Impaired flexibility, Increased fascial restricitons  Visit Diagnosis: Myalgia  Other lack of coordination  Other abnormalities of gait and mobility     Problem List Patient Active Problem List   Diagnosis Date Noted  .  Hypokalemia 01/25/2017  . 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 Encounter for health maintenance examination with abnormal findings 11/16/2016  . History of hypertension 01/20/2016  . Prediabetes 01/20/2016  . Hyperlipidemia 01/20/2016    Jerl Mina ,PT, DPT, E-RYT  05/20/2017, 5:10 PM  [place] MAIN [facility] SERVICES [address] [place], [place], [address] Phone: [phone]   Fax:  [phone]  Name: Anna Kim MRN: [id number] Date of Birth: 02/23/1976

## 2017-05-20 NOTE — Patient Instructions (Addendum)
Practice proper pelvic floor coordination  Inhale: expand pelvic floor mm Exhale" "j" scoop, allow pelvic floor to close, lift first before belly sinks   ( not "draw abdominal muscle to spine" or strain with abdominal muscles")  __________   Stretch for L pelvic floor muscle  Extended side angle :  Feet are hip width apart, L foot one behind like you are on ski tracks,  R knee bent over ankle but not more forward then the ankle.  Make sure 50% weight is in the front foot/leg , 50% weight is the back foot/ leg    Rest R forearm lightly on top of thigh,  L hand on L hip.  Inhale lengthen spine,   Exhale turn navel to the L then the ribcage turns, look at the other wall.  Keep maintaining  50% weight is in the front foot/leg , 50% weight is the back foot/ leg  And make sure the front knee is still pointed in the toe line of the 2nd toe.   3 breaths here.   ___________  Strengthening with band  1)  Weight shift across legs 70-30% like tug a war  Inhale  Exhale shift b ack to back legs with trunk and elbows back  10 reps   Each leg  2)  Lateral lunges  With pulling of bands to side abs with exhale  2 min   _______  Notice standing posture, avoid leaning on one leg , make sure to have equal stance of both leg

## 2017-05-21 ENCOUNTER — Other Ambulatory Visit: Payer: Self-pay

## 2017-05-27 ENCOUNTER — Ambulatory Visit: Payer: 59 | Attending: Urology | Admitting: Physical Therapy

## 2017-05-27 DIAGNOSIS — M791 Myalgia, unspecified site: Secondary | ICD-10-CM | POA: Insufficient documentation

## 2017-05-27 DIAGNOSIS — R2689 Other abnormalities of gait and mobility: Secondary | ICD-10-CM | POA: Insufficient documentation

## 2017-05-27 DIAGNOSIS — R278 Other lack of coordination: Secondary | ICD-10-CM | POA: Insufficient documentation

## 2017-05-27 NOTE — Therapy (Signed)
[place] MAIN [facility] SERVICES [address] [place], [place], [address] Phone: [phone]   Fax:  [phone]  Physical Therapy Treatment  Patient Details  Name: Anna Kim MRN: [id number] Date of Birth: August 25, 1975 Referring Provider: Zara Council   Encounter Date: 05/27/2017  PT End of Session - 05/27/17 1815    Visit Number  6    Number of Visits  12    Date for PT Re-Evaluation  06/05/17    PT Start Time  6834    PT Stop Time  1650    PT Time Calculation (min)  45 min    Activity Tolerance  Patient tolerated treatment well    Behavior During Therapy  [facility] for tasks assessed/performed       Past Medical History:  Diagnosis Date  . HLD (hyperlipidemia)   . HTN (hypertension)   . Migraine   . Ureteral stone   . Vaginal Pap smear, abnormal    Remote history of 1 abnormal pap    Past Surgical History:  Procedure Laterality Date  . TONSILLECTOMY  2011    There were no vitals filed for this visit.  Subjective Assessment - 05/27/17 1608    Subjective  Pt still hass ome leakage with sneezing.  Pt tries to do her HEP about 5 days out of the week    Pertinent History  Gynecological: vaginal delivery with 3rd degree tear. Denied low back / pelvic pain. Pt has had plantar fasciitis which shots have helped relieve the Sx for the past 3 years.     Patient Stated Goals  Pt would like to do activiteis and not be concerned about leakage.          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                Pelvic Floor Special Questions - 05/27/17 1608    Diastasis Recti  neg    Pelvic Floor Internal Exam  pt consented verbally without contraindications after being explained details of exam     Exam Type  Vaginal    Palpation  tenderness/tensions at R  urethra compressae ( improved post Tx)      Strength  fair squeeze, definite lift 3/5 initially decreased R activation        [facility] Adult PT Treatment/Exercise - 05/27/17 1813      Neuro Re-ed    Neuro Re-ed Details   see pt instructions       Manual Therapy   Internal Pelvic Floor  thiele massage at problem areas with coordinated breathing              PT Education - 05/27/17 1815    Education provided  Yes    Education Details  HEP    Person(s) Educated  Patient    Methods  Explanation;Demonstration;Tactile cues;Verbal cues;Handout    Comprehension  Returned demonstration;Verbal cues required;Verbalized understanding;Tactile cues required          PT Long Term Goals - 04/29/17 1807      PT LONG TERM GOAL #1   Title  Pt will decrease her UDI score from 54% to < 49% in order to restore pelvic floor function    Time  12    Period  Weeks    Status  On-going  PT LONG TERM GOAL #2   Title  Pt will demo proper pelvic floor coordination with other deep core mm in order to regain pelvic floor ROM to minimize leakage    Time  2    Period  Weeks    Status  Partially Met      PT LONG TERM GOAL #3   Title  Pt will demo no pelvic floor mm tensions across 2 visits in order to progress to deep core strengthening and fitness exercises     Time  8    Period  Weeks    Status  Achieved      PT LONG TERM GOAL #4   Title  Pt will demo proper co-activation of deep core and lower kinetic chain in fitenss routines in order to begin a gym routine withminimzie risks for injuries or relapse of Sx    Time  10    Period  Weeks    Status  Partially Met      PT LONG TERM GOAL #5   Title  Pt will report minimal leakage while jumping on the trampoline for 30 sec x 3 reps in order play with her dtr    Time  12    Period  Weeks    Status  On-going      Additional Long Term Goals   Additional Long Term Goals  Yes      PT LONG TERM GOAL #6   Title  Pt will demo a more cranial  position of urethra/bladder behind pbuc symphysis across 2 visits in order to advance to upright fitness exercises and plyometrics to exercise with dtr     Time  8    Period  Weeks    Status  New    Target Date  06/24/17      PT LONG TERM GOAL #7   Title  Pt will demo increased low trap/retraction mm strength from 3/5 to >4/5 B and no spinal pertubation with BLE/ scaption MMT in order to perform repeated lifting and pt handling on the job w/ less risk for injuries     Time  12    Period  Weeks    Status  New    Target Date  07/22/17            Plan - 05/27/17 1816    Clinical Impression Statement  Pt demo'd decreased pelvic floor tightenss at R urethra compressae mm with increased ROM and proper activation of pelvic floor mm circumferentially and sequentially. Pt demo'd good carry over from past HEP and practiced application of HEP to work body mechanics to co-activate the lower kinetic chain.  Pt required cues to minimize chest breathing but showed less oblique mm overuse without cues. Pt demo'd proper coordination technique for proper pelvic relaxation.  Pt continues to benefit frmo skilled PT.     Rehab Potential  Good    PT Frequency  1x / week    PT Duration  12 weeks    PT Treatment/Interventions  Gait training;Balance training;Therapeutic exercise;Neuromuscular re-education;Stair training;Aquatic Therapy;Functional mobility training;Manual techniques;Patient/family education;Therapeutic activities;Moist Heat    Consulted and Agree with Plan of Care  Patient       Patient will benefit from skilled therapeutic intervention in order to improve the following deficits and impairments:  Improper body mechanics, Increased muscle spasms, Decreased strength, Decreased endurance, Decreased mobility, Decreased coordination, Postural dysfunction, Decreased safety awareness, Decreased range of motion, Impaired flexibility, Increased fascial restricitons  Visit Diagnosis: Myalgia  Other  lack of coordination  Other abnormalities of gait and mobility     Problem List Patient Active Problem List   Diagnosis Date Noted  . Hypokalemia 01/25/2017  . 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 Encounter for health maintenance examination with abnormal findings 11/16/2016  . History of hypertension 01/20/2016  . Prediabetes 01/20/2016  . Hyperlipidemia 01/20/2016    Jerl Mina ,PT, DPT, E-RYT  05/27/2017, 6:18 PM  [place] MAIN [facility] SERVICES [address] [place], [place], [address] Phone: [phone]   Fax:  [phone]  Name: Anna Kim MRN: [id number] Date of Birth: 06/26/1975

## 2017-05-27 NOTE — Patient Instructions (Signed)
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

## 2017-06-05 ENCOUNTER — Ambulatory Visit
Admission: RE | Admit: 2017-06-05 | Discharge: 2017-06-05 | Disposition: A | Discharge status: Home / Self Care | Payer: 59 | Source: Ambulatory Visit | Attending: Obstetrics and Gynecology | Admitting: Obstetrics and Gynecology

## 2017-06-05 DIAGNOSIS — Z1231 Encounter for screening mammogram for malignant neoplasm of breast: Secondary | ICD-10-CM | POA: Diagnosis not present

## 2017-06-07 ENCOUNTER — Other Ambulatory Visit (INDEPENDENT_AMBULATORY_CARE_PROVIDER_SITE_OTHER): Payer: 59

## 2017-06-07 DIAGNOSIS — E785 Hyperlipidemia, unspecified: Secondary | ICD-10-CM | POA: Diagnosis not present

## 2017-06-07 LAB — HEPATIC FUNCTION PANEL
ALK PHOS: 49 U/L (ref 39–117)
ALT: 12 U/L (ref 0–35)
AST: 16 U/L (ref 0–37)
Albumin: 4.1 g/dL (ref 3.5–5.2)
BILIRUBIN DIRECT: 0.1 mg/dL (ref 0.0–0.3)
BILIRUBIN TOTAL: 0.5 mg/dL (ref 0.2–1.2)
Total Protein: 7.1 g/dL (ref 6.0–8.3)

## 2017-06-07 LAB — LDL CHOLESTEROL, DIRECT: LDL DIRECT: 100 mg/dL

## 2017-06-10 ENCOUNTER — Ambulatory Visit: Payer: 59 | Admitting: Physical Therapy

## 2017-06-10 DIAGNOSIS — R278 Other lack of coordination: Secondary | ICD-10-CM

## 2017-06-10 DIAGNOSIS — R2689 Other abnormalities of gait and mobility: Secondary | ICD-10-CM | POA: Diagnosis not present

## 2017-06-10 DIAGNOSIS — M791 Myalgia, unspecified site: Secondary | ICD-10-CM

## 2017-06-10 NOTE — Patient Instructions (Addendum)
On exhale:   less ribs crunching,  More widening of ribs on inhale and then a lift with the pelvic floor first on exhalation   ______  Seated ribcage expansion. Pelvic floor lift with strap/towel at ribcage    ________  Lengthened breath  While bouncing on pilates ball   2 min    ___________  Keep with  Deep core and the bridge with bands

## 2017-06-11 NOTE — Therapy (Addendum)
[place] MAIN [facility] SERVICES [address] [place], [place], [address] Phone: [phone]   Fax:  [phone]  Physical Therapy Treatment [place] [facility] Note  Patient Details  Name: Anna Kim MRN: [id number] Date of Birth: 01-11-1976 Referring Provider: Zara Council   Encounter Date: 06/10/2017  PT End of Session - 06/10/17 1613    Visit Number  7    Number of Visits  12    Date for PT Re-Evaluation  06/05/17    PT Start Time  6734    PT Stop Time  1655    PT Time Calculation (min)  45 min    Activity Tolerance  Patient tolerated treatment well    Behavior During Therapy  [facility] for tasks assessed/performed       Past Medical History:  Diagnosis Date  . HLD (hyperlipidemia)   . HTN (hypertension)   . Migraine   . Ureteral stone   . Vaginal Pap smear, abnormal    Remote history of 1 abnormal pap    Past Surgical History:  Procedure Laterality Date  . TONSILLECTOMY  2011    There were no vitals filed for this visit.  Subjective Assessment - 06/11/17 2308    Subjective  Pt has some leakage still.     Pertinent History  Gynecological: vaginal delivery with 3rd degree tear. Denied low back / pelvic pain. Pt has had plantar fasciitis which shots have helped relieve the Sx for the past 3 years.     Patient Stated Goals  Pt would like to do activiteis and not be concerned about leakage.                    Pelvic Floor Special Questions - 06/11/17 2306    Diastasis Recti  neg    Pelvic Floor Internal Exam  pt consented verbally without contraindications after being explained details of exam     Exam Type  Vaginal    Palpation  faciliation of pelvic floor     Strength  fair squeeze, definite lift less cue for oblique overuse        [facility] Adult PT Treatment/Exercise - 06/11/17 2306      Neuro Re-ed    Neuro Re-ed Details   see pt instructions       Manual Therapy   Internal Pelvic Floor  facilitation of pelvic  floor with less oblique overuse              PT Education - 06/11/17 2307    Education provided  Yes    Education Details  HEP    Person(s) Educated  Patient    Methods  Explanation;Demonstration;Tactile cues;Verbal cues;Handout    Comprehension  Returned demonstration;Verbalized understanding          PT Long Term Goals - 06/10/17 1614      PT LONG TERM GOAL #1   Title  Pt will decrease her UDI score from 54% to < 49% in order to restore pelvic floor function  (Pended)     Time  12  (Pended)     Period  Weeks  (Pended)     Status  On-going  (Pended)       PT LONG TERM GOAL #2   Title  Pt will demo proper pelvic floor coordination with other deep core mm in order to regain pelvic floor ROM to minimize leakage  (Pended)     Time  2  (Pended)     Period  Weeks  (Pended)     Status  Achieved  (Pended)       PT LONG TERM GOAL #3   Title  Pt will demo no pelvic floor mm tensions across 2 visits in order to progress to deep core strengthening and fitness exercises   (Pended)     Time  8  (Pended)     Period  Weeks  (Pended)     Status  Achieved  (Pended)       PT LONG TERM GOAL #4   Title  Pt will demo proper co-activation of deep core and lower kinetic chain in fitness routines in order to begin a gym routine withminimzie risks for injuries or relapse of Sx  (Pended)     Time  10  (Pended)     Period  Weeks  (Pended)     Status  Achieved  (Pended)       PT LONG TERM GOAL #5   Title  Pt will report minimal leakage while jumping on the trampoline for 30 sec x 3 reps in order play with her dtr  (Pended)     Time  12  (Pended)     Period  Weeks  (Pended)     Status  On-going  (Pended)       PT LONG TERM GOAL #6   Title  Pt will demo a more cranial position of urethra/bladder behind pbuc symphysis across 2 visits in order to advance to upright fitness exercises and plyometrics to exercise with dtr   (Pended)     Time  8  (Pended)     Period  Weeks  (Pended)     Status   New  (Pended)       PT LONG TERM GOAL #7   Title  Pt will demo increased low trap/retraction mm strength from 3/5 to >4/5 B and no spinal pertubation with BLE/ scaption MMT in order to perform repeated lifting and pt handling on the job w/ less risk for injuries   (Pended)     Time  12  (Pended)     Period  Weeks  (Pended)     Status  New  (Pended)             Plan - 06/11/17 2308    Clinical Impression Statement Pt has achieved 2/7 goals and is progressing well towards her goals. Pt is starting to demonstrate less overuse of her abdominal mm to activate her pelvic floor mm. Pt is showing less pelvic floor mm tensions and a more cranial position of her pelvic organs. Pt has progressed to proper pelvic floor strengthening exercise in supine and seated. 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 Pt demo'd proper body mechanics with pt handling. Today, pt demo'd proper technique with co-activation of deep core while seated on a pilates ball with small bouncing to gradually advance pt to jumping on a trampoline with more control over her bladder. Pt continues to benefit from skilled PT.       Rehab Potential  Good    PT Frequency  1x / week    PT Duration  12 weeks    PT Treatment/Interventions  Gait training;Balance training;Therapeutic exercise;Neuromuscular re-education;Stair training;Aquatic Therapy;Functional mobility training;Manual techniques;Patient/family education;Therapeutic activities;Moist Heat    Consulted and Agree with Plan of Care  Patient  Patient will benefit from skilled therapeutic intervention in order to improve the following deficits and impairments:  Improper body mechanics, Increased muscle spasms, Decreased strength, Decreased endurance, Decreased mobility, Decreased coordination, Postural  dysfunction, Decreased safety awareness, Decreased range of motion, Impaired flexibility, Increased fascial restricitons  Visit Diagnosis: Myalgia  Other lack of coordination  Other abnormalities of gait and mobility     Problem List Patient Active Problem List   Diagnosis Date Noted  . Hypokalemia 01/25/2017  . 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 Encounter for health maintenance examination with abnormal findings 11/16/2016  . History of hypertension 01/20/2016  . Prediabetes 01/20/2016  . Hyperlipidemia 01/20/2016    Jerl Mina ,PT, DPT, E-RYT  06/11/2017, 11:09 PM  [place] MAIN [facility] SERVICES [address] [place], [place], [address] Phone: [phone]   Fax:  [phone]  Name: Anna Kim MRN: [id number] Date of Birth: 04/11/1975

## 2017-06-20 NOTE — Addendum Note (Signed)
Addended by: Jerl Mina on: 06/20/2017 05:42 PM   Modules accepted: Orders

## 2017-06-24 NOTE — Addendum Note (Signed)
Addended by: Jerl Mina on: 06/24/2017 01:11 PM   Modules accepted: Orders

## 2017-06-25 ENCOUNTER — Ambulatory Visit: Payer: 59 | Attending: Urology | Admitting: Physical Therapy

## 2017-06-25 DIAGNOSIS — M791 Myalgia, unspecified site: Secondary | ICD-10-CM | POA: Diagnosis not present

## 2017-06-25 DIAGNOSIS — R278 Other lack of coordination: Secondary | ICD-10-CM

## 2017-06-25 DIAGNOSIS — R2689 Other abnormalities of gait and mobility: Secondary | ICD-10-CM

## 2017-06-25 NOTE — Patient Instructions (Addendum)
Stretches:  Happy baby pose  5 breaths   10 min post work stretch with pillows under knees  Butterfly pose ( feet together   ______   PELVIC FLOOR / KEGEL EXERCISES   Pelvic floor/ Kegel exercises are used to strengthen the muscles in the base of your pelvis that are responsible for supporting your pelvic organs and preventing urine/feces leakage. Based on your therapist's recommendations, they can be performed while standing, sitting, or lying down. Imagine pelvic floor area as a diamond with pelvic landmarks: top =pubic bone, bottom tip=tailbone, sides=sitting bones (ischial tuberosities).    Make yourself aware of this muscle group by using these cues while coordinating your breath:  Inhale, feel pelvic floor diamond area lower like hammock towards your feet and ribcage/belly expanding. Pause. Let the exhale naturally and feel your belly sink, abdominal muscles hugging in around you and you may notice the pelvic diamond draws upward towards your head forming a umbrella shape. Give a squeeze during the exhalation like you are stopping the flow of urine. If you are squeezing the buttock muscles, try to give 50% less effort.   Common Errors:  Breath holding: If you are holding your breath, you may be bearing down against your bladder instead of pulling it up. If you belly bulges up while you are squeezing, you are holding your breath. Be sure to breathe gently in and out while exercising. Counting out loud may help you avoid holding your breath.  Accessory muscle use: You should not see or feel other muscle movement when performing pelvic floor exercises. When done properly, no one can tell that you are performing the exercises. Keep the buttocks, belly and inner thighs relaxed.  Overdoing it: Your muscles can fatigue and stop working for you if you over-exercise. You may actually leak more or feel soreness at the lower abdomen or rectum.  YOUR HOME EXERCISE PROGRAM  LONG HOLDS:    Position: on back or reclined in car seat ( do not lift head to sit up, instead make sure to use the handle to raise the car seat up and keep spine/head relaxed to not place load on pelvic floor/ abdominal muscle)     Inhale and then exhale. Then squeeze the muscle and count aloud for 3 seconds. Rest with three long breaths. (Be sure to let belly sink in with exhales and not push outward)  Perform 2 repetitions, 2 times/day ( relaxation time after work, before bed)   SHORT HOLDS: Position: on sitting   Inhale and then exhale. Then squeeze the muscle.  (Be sure to let belly sink in with exhales and not push outward)  Perform 3  repetitions, 3 Times/day                      DECREASE DOWNWARD PRESSURE ON  YOUR PELVIC FLOOR, ABDOMINAL, LOW BACK MUSCLES       PRESERVE YOUR PELVIC HEALTH LONG-TERM   ** SQUEEZE pelvic floor BEFORE YOUR SNEEZE, COUGH, LAUGH   ** EXHALE BEFORE YOU RISE AGAINST GRAVITY (lifting, sit to stand, from squat to stand)   ** LOG ROLL OUT OF BED INSTEAD OF CRUNCH/SIT-UP

## 2017-06-25 NOTE — Therapy (Signed)
[place] MAIN [facility] SERVICES [address] [place], [place], [address] Phone: [phone]   Fax:  [phone]  Physical Therapy Treatment  Patient Details  Name: Anna Kim MRN: [id number] Date of Birth: [birth year] Referring Provider: Zara Council   Encounter Date: 06/25/2017  PT End of Session - 06/25/17 1552    Visit Number  8    Number of Visits  12    Date for PT Re-Evaluation  06/05/17    PT Start Time  1500    PT Stop Time  1545    PT Time Calculation (min)  45 min    Activity Tolerance  Patient tolerated treatment well    Behavior During Therapy  [facility] for tasks assessed/performed       Past Medical History:  Diagnosis Date  . HLD (hyperlipidemia)   . HTN (hypertension)   . Migraine   . Ureteral stone   . Vaginal Pap smear, abnormal    Remote history of 1 abnormal pap    Past Surgical History:  Procedure Laterality Date  . TONSILLECTOMY  2011    There were no vitals filed for this visit.  Subjective Assessment - 06/25/17 1505    Subjective  Pt notices she is paying more attention to her breathing.      Pertinent History  Gynecological: vaginal delivery with 3rd degree tear. Denied low back / pelvic pain. Pt has had plantar fasciitis which shots have helped relieve the Sx for the past 3 years.     Patient Stated Goals  Pt would like to do activiteis and not be concerned about leakage.          [facility] PT Assessment - 06/25/17 1534      Coordination   Gross Motor Movements are Fluid and Coordinated  -- no more ab oversuse. pelvic floor cues for posterior expansi    Fine Motor Movements are Fluid and Coordinated  -- seated 3 quick, 3 reps                 Pelvic Floor Special Questions - 06/25/17 1529    Diastasis Recti  neg    Pelvic Floor Internal Exam  pt consented verbally without contraindications after being explained details of exam     Exam Type  Vaginal    Palpation  increased tightness at  5-6 o clock along 1-3 layers, anterior mm with proper coordination compared to last session     Strength  fair squeeze, definite lift    Strength # of reps  2    Strength # of seconds  3      Quick contraction 4 reps, 1 sec before fatigue  Seated 3 reps, 1 sec   [facility] Adult PT Treatment/Exercise - 06/25/17 1553      Neuro Re-ed    Neuro Re-ed Details   see pt instructions       Moist Heat Therapy   Number Minutes Moist Heat  5 Minutes    Moist Heat Location  -- perineum ( towel wrapped)       Manual Therapy   Internal Pelvic Floor  thiele massage at problem areas with coordinated breathing                   PT Long Term Goals - 06/25/17 1554      PT LONG TERM GOAL #1   Title  Pt will decrease her UDI score from 54% to < 49% in order to restore  pelvic floor function    Time  12    Period  Weeks    Status  On-going      PT LONG TERM GOAL #2   Title  Pt will demo proper pelvic floor coordination with other deep core mm in order to regain pelvic floor ROM to minimize leakage    Time  2    Period  Weeks    Status  Achieved      PT LONG TERM GOAL #3   Title  Pt will demo no pelvic floor mm tensions across 2 visits in order to progress to deep core strengthening and fitness exercises     Time  8    Period  Weeks    Status  Achieved      PT LONG TERM GOAL #4   Title  Pt will demo proper co-activation of deep core and lower kinetic chain in fitness routines in order to begin a gym routine withminimzie risks for injuries or relapse of Sx    Time  10    Period  Weeks    Status  Achieved      PT LONG TERM GOAL #5   Title  Pt will report minimal leakage while jumping on the trampoline for 30 sec x 3 reps in order play with her dtr    Time  12    Period  Weeks    Status  On-going      PT LONG TERM GOAL #6   Title  Pt will demo a more cranial position of urethra/bladder behind pbuc symphysis across 2 visits in order to advance to upright fitness exercises and  plyometrics to exercise with dtr     Time  8    Period  Weeks    Status  Achieved      PT LONG TERM GOAL #7   Title  Pt will demo increased low trap/retraction mm strength from 3/5 to >4/5 B and no spinal pertubation with BLE/ scaption MMT in order to perform repeated lifting and pt handling on the job w/ less risk for injuries     Time  12    Period  Weeks    Status  On-going            Plan - 06/25/17 1555    Clinical Impression Statement  Pt achieved proper pelvic floor coordination without oblique mm overuse and improved anterior pelvic floor range of motion of anterior mm along with a more cranial position of her urethra compared past sessions. Intravaginal Tx addressed perineal scar restrictions which faciliated improved mobility on L pelvic floor mm.  With improved coordination and ROM but low endurance of strength, pt was progressed to kegel strengthening with low reps. Pt continues to benefit from skilled PT    Rehab Potential  Good    PT Frequency  1x / week    PT Duration  12 weeks    PT Treatment/Interventions  Gait training;Balance training;Therapeutic exercise;Neuromuscular re-education;Stair training;Aquatic Therapy;Functional mobility training;Manual techniques;Patient/family education;Therapeutic activities;Moist Heat    Consulted and Agree with Plan of Care  Patient       Patient will benefit from skilled therapeutic intervention in order to improve the following deficits and impairments:  Improper body mechanics, Increased muscle spasms, Decreased strength, Decreased endurance, Decreased mobility, Decreased coordination, Postural dysfunction, Decreased safety awareness, Decreased range of motion, Impaired flexibility, Increased fascial restricitons  Visit Diagnosis: Myalgia  Other lack of coordination  Other abnormalities of gait and mobility  Problem List Patient Active Problem List   Diagnosis Date Noted  . Hypokalemia 01/25/2017  . Renal cortex  thinning 01/25/2017  . Encounter for health maintenance examination with abnormal findings 11/16/2016  . History of hypertension 01/20/2016  . Prediabetes 01/20/2016  . Hyperlipidemia 01/20/2016    Jerl Mina ,PT, DPT, E-RYT  06/25/2017, 3:59 PM  [place] MAIN [facility] SERVICES [address] [place], [place], [address] Phone: [phone]   Fax:  [phone]  Name: Anna Kim MRN: [id number] Date of Birth: 10-01-1975

## 2017-07-09 ENCOUNTER — Ambulatory Visit: Payer: 59 | Admitting: Physical Therapy

## 2017-07-09 DIAGNOSIS — R278 Other lack of coordination: Secondary | ICD-10-CM | POA: Diagnosis not present

## 2017-07-09 DIAGNOSIS — R2689 Other abnormalities of gait and mobility: Secondary | ICD-10-CM | POA: Diagnosis not present

## 2017-07-09 DIAGNOSIS — M791 Myalgia, unspecified site: Secondary | ICD-10-CM | POA: Diagnosis not present

## 2017-07-09 NOTE — Therapy (Addendum)
[place] MAIN [facility] SERVICES [address] [place], [place], [address] Phone: [phone]   Fax:  [phone]  Physical Therapy Treatment / Progress Note  Patient Details  Name: Anna Kim MRN: [id number] Date of Birth: Jan 03, 1976 Referring Provider: Zara Council   Encounter Date: 07/09/2017  PT End of Session - 07/09/17 1646    Visit Number  9    Number of Visits  12    Date for PT Re-Evaluation  09/03/17    PT Start Time  1612    PT Stop Time  1646    PT Time Calculation (min)  34 min    Activity Tolerance  Patient tolerated treatment well    Behavior During Therapy  [facility] for tasks assessed/performed       Past Medical History:  Diagnosis Date  . HLD (hyperlipidemia)   . HTN (hypertension)   . Migraine   . Ureteral stone   . Vaginal Pap smear, abnormal    Remote history of 1 abnormal pap    Past Surgical History:  Procedure Laterality Date  . TONSILLECTOMY  2011    There were no vitals filed for this visit.  Subjective Assessment - 07/09/17 1611    Subjective  Pt has been practicing her standing techniques at work and her kegels . Pt has not had much leakage over the past 2 weeks.     Pertinent History  Gynecological: vaginal delivery with 3rd degree tear. Denied low back / pelvic pain. Pt has had plantar fasciitis which shots have helped relieve the Sx for the past 3 years.     Patient Stated Goals  Pt would like to do activiteis and not be concerned about leakage.                     Pelvic Floor Special Questions - 07/09/17 1644    Diastasis Recti  neg    Pelvic Floor Internal Exam  pt consented verbally without contraindications after being explained details of exam     Exam Type  Vaginal    Palpation  increased tightness at 5-6 o clock along 2-3 layers, R obt int , iliococcygeus mm     Strength  fair squeeze, definite lift    Strength # of reps  2    Strength # of seconds  3        [facility]  Adult PT Treatment/Exercise - 07/09/17 1645      Moist Heat Therapy   Number Minutes Moist Heat  5 Minutes    Moist Heat Location  -- perineal      Manual Therapy   Internal Pelvic Floor  thiele massage at problem areas w/ MWM                   PT Long Term Goals - 07/09/17 1612      PT LONG TERM GOAL #1   Title  Pt will decrease her UDI score from 54% to < 49% in order to restore pelvic floor function ( 4/16: 42%)     Time  12    Period  Weeks    Status  Achieved      PT LONG TERM GOAL #2   Title  Pt will demo proper pelvic floor coordination with other deep core mm in order to regain pelvic floor ROM to minimize leakage    Time  2    Period  Weeks    Status  Achieved  PT LONG TERM GOAL #3   Title  Pt will demo no pelvic floor mm tensions across 2 visits in order to progress to deep core strengthening and fitness exercises     Time  8    Period  Weeks    Status  Achieved      PT LONG TERM GOAL #4   Title  Pt will demo proper co-activation of deep core and lower kinetic chain in fitness routines in order to begin a gym routine withminimzie risks for injuries or relapse of Sx    Time  10    Period  Weeks    Status  Achieved      PT LONG TERM GOAL #5   Title  Pt will report minimal leakage while jumping on the trampoline for 30 sec x 3 reps in order play with her dtr    Time  12    Period  Weeks    Status  On-going      PT LONG TERM GOAL #6   Title  Pt will demo a more cranial position of urethra/bladder behind pbuc symphysis across 2 visits in order to advance to upright fitness exercises and plyometrics to exercise with dtr     Time  8    Period  Weeks    Status  Achieved      PT LONG TERM GOAL #7   Title  Pt will demo increased low trap/retraction mm strength from 3/5 to >4/5 B and no spinal pertubation with BLE/ scaption MMT in order to perform repeated lifting and pt handling on the job w/ less risk for injuries     Time  12    Period  Weeks     Status  On-going            Plan - 07/09/17 1648    Clinical Impression Statement  Pt has achieved 5/7 goals with significantly decreased leakage and improved score on UDI from 54% to 42%. 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 Post Tx today, pt showed less restrictions at 2-3 rd layer of her pelvic floor mm. Pt continues to benefit from skilled PT.     Rehab Potential  Good    PT Frequency  1x / week    PT Duration  12 weeks    PT Treatment/Interventions  Gait training;Balance training;Therapeutic exercise;Neuromuscular re-education;Stair training;Aquatic Therapy;Functional mobility training;Manual techniques;Patient/family education;Therapeutic activities;Moist Heat    Consulted and Agree with Plan of Care  Patient       Patient will benefit from skilled therapeutic intervention in order to improve the following deficits and impairments:  Improper body mechanics, Increased muscle spasms, Decreased strength, Decreased endurance, Decreased mobility, Decreased coordination, Postural dysfunction, Decreased safety awareness, Decreased range of motion, Impaired flexibility, Increased fascial restricitons  Visit Diagnosis: Myalgia  Other lack of coordination  Other abnormalities of gait and mobility     Problem List Patient Active Problem List   Diagnosis Date Noted  . Hypokalemia 01/25/2017  . 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 Encounter for health maintenance examination with abnormal findings 11/16/2016  . History of hypertension 01/20/2016  . Prediabetes 01/20/2016  . Hyperlipidemia 01/20/2016    Jerl Mina ,PT, DPT, E-RYT  07/09/2017, 4:51 PM  [place] MAIN [facility] SERVICES [place], [place],  [place] Phone: [phone]   Fax:   [phone]  Name: Anna Kim MRN: [id number] Date of Birth: 1975-12-16

## 2017-07-25 DIAGNOSIS — M722 Plantar fascial fibromatosis: Secondary | ICD-10-CM | POA: Diagnosis not present

## 2017-07-30 ENCOUNTER — Ambulatory Visit: Payer: 59 | Attending: Urology | Admitting: Physical Therapy

## 2017-07-30 DIAGNOSIS — R2689 Other abnormalities of gait and mobility: Secondary | ICD-10-CM | POA: Insufficient documentation

## 2017-07-30 DIAGNOSIS — R278 Other lack of coordination: Secondary | ICD-10-CM | POA: Diagnosis not present

## 2017-07-30 DIAGNOSIS — M791 Myalgia, unspecified site: Secondary | ICD-10-CM | POA: Diagnosis not present

## 2017-07-30 NOTE — Therapy (Signed)
[place] MAIN [facility] SERVICES [address] [place], [place], [address] Phone: [phone]   Fax:  [phone]  Physical Therapy Treatment / Discharge Summary   Patient Details  Name: Anna Kim MRN: [id number] Date of Birth: Jul 12, 1975 Referring Provider: Zara Council   Encounter Date: 07/30/2017  PT End of Session - 07/30/17 1539    Visit Number  9    Number of Visits  12    Date for PT Re-Evaluation  09/03/17    PT Start Time  1500    PT Stop Time  1517    PT Time Calculation (min)  17 min    Activity Tolerance  Patient tolerated treatment well    Behavior During Therapy  [facility] for tasks assessed/performed       Past Medical History:  Diagnosis Date  . HLD (hyperlipidemia)   . HTN (hypertension)   . Migraine   . Ureteral stone   . Vaginal Pap smear, abnormal    Remote history of 1 abnormal pap    Past Surgical History:  Procedure Laterality Date  . TONSILLECTOMY  2011    There were no vitals filed for this visit.  Subjective Assessment - 07/30/17 1457    Subjective  Pt has only been able to walk due to work schedule/ home life.     Pertinent History  Gynecological: vaginal delivery with 3rd degree tear. Denied low back / pelvic pain. Pt has had plantar fasciitis which shots have helped relieve the Sx for the past 3 years.     Patient Stated Goals  Pt would like to do activiteis and not be concerned about leakage.          [facility] PT Assessment - 07/30/17 1532      Observation/Other Assessments   Observations  minor cues for feet alignment with jumping on trampoline with co-activation of BLE and exhalation       Strength   Overall Strength Comments  low trap , retraction B 4/5                            PT Education - 07/30/17 1536    Education provided  Yes    Education Details  reassessed goals, d/c    Person(s) Educated  Patient    Methods  Explanation;Demonstration;Tactile cues;Verbal  cues    Comprehension  Verbalized understanding;Returned demonstration          PT Long Term Goals - 07/30/17 1506      PT LONG TERM GOAL #1   Title  Pt will decrease her UDI score from 54% to < 49% in order to restore pelvic floor function ( 4/16: 42%)     Time  12    Period  Weeks    Status  Achieved      PT LONG TERM GOAL #2   Title  Pt will demo proper pelvic floor coordination with other deep core mm in order to regain pelvic floor ROM to minimize leakage    Time  2    Period  Weeks    Status  Achieved      PT LONG TERM GOAL #3   Title  Pt will demo no pelvic floor mm tensions across 2 visits in order to progress to deep core strengthening and fitness exercises     Time  8    Period  Weeks    Status  Achieved  PT LONG TERM GOAL #4   Title  Pt will demo proper co-activation of deep core and lower kinetic chain in fitness routines in order to begin a gym routine withminimzie risks for injuries or relapse of Sx    Time  10    Period  Weeks    Status  Achieved      PT LONG TERM GOAL #5   Title  Pt will report minimal leakage while jumping on the trampoline for 30 sec x 3 reps in order play with her dtr  ( 5/7: 30 sec without leakage with light jogging on trampoline)     Time  12    Period  Weeks    Status  Achieved      PT LONG TERM GOAL #6   Title  Pt will demo a more cranial position of urethra/bladder behind pbuc symphysis across 2 visits in order to advance to upright fitness exercises and plyometrics to exercise with dtr     Time  8    Period  Weeks    Status  Achieved      PT LONG TERM GOAL #7   Title  Pt will demo increased low trap/retraction mm strength from 3/5 to >4/5 B and no spinal pertubation with BLE/ scaption MMT in order to perform repeated lifting and pt handling on the job w/ less risk for injuries     Time  12    Period  Weeks    Status  Achieved            Plan - 07/30/17 1539    Clinical Impression Statement  Pt has met 100%  of  her goals. Pt reports her urinary leakage has improved "Quite a bit better" based on the GROC score. Pt also has decreased pelvic floor mm tightness, improved deep core coordination, stronger lumbopelvic stability, and stronger thoracolumbar strength. Pt showed improved body mechanics with lifting and pt handling which will minimize overactivity of pelvic floor and relapse of Sx. Pt is ready for d/c today.      Rehab Potential  Good    PT Frequency  1x / week    PT Duration  12 weeks    PT Treatment/Interventions  Gait training;Balance training;Therapeutic exercise;Neuromuscular re-education;Stair training;Aquatic Therapy;Functional mobility training;Manual techniques;Patient/family education;Therapeutic activities;Moist Heat    Consulted and Agree with Plan of Care  Patient       Patient will benefit from skilled therapeutic intervention in order to improve the following deficits and impairments:  Improper body mechanics, Increased muscle spasms, Decreased strength, Decreased endurance, Decreased mobility, Decreased coordination, Postural dysfunction, Decreased safety awareness, Decreased range of motion, Impaired flexibility, Increased fascial restricitons  Visit Diagnosis: Myalgia  Other lack of coordination  Other abnormalities of gait and mobility     Problem List Patient Active Problem List   Diagnosis Date Noted  . Hypokalemia 01/25/2017  . 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 Encounter for health maintenance examination with abnormal findings 11/16/2016  . History of hypertension 01/20/2016  . Prediabetes 01/20/2016  . Hyperlipidemia 01/20/2016    Jerl Mina ,PT, DPT, E-RYT  07/30/2017, 3:44 PM  [place] MAIN [facility] SERVICES [address] [place], [place], [address] Phone: [phone]   Fax:  [phone]  Name: Anna Kim MRN: [id number] Date of Birth: [birth year]

## 2017-07-30 NOTE — Patient Instructions (Signed)
Land softly with jumps   Exhalation with marching on trampoline  Feet hip width apart

## 2017-08-12 ENCOUNTER — Ambulatory Visit: Payer: 59 | Admitting: Physical Therapy

## 2017-08-26 ENCOUNTER — Ambulatory Visit: Payer: 59 | Admitting: Physical Therapy

## 2017-09-09 ENCOUNTER — Encounter: Payer: Self-pay | Admitting: Physical Therapy

## 2017-10-28 DIAGNOSIS — M722 Plantar fascial fibromatosis: Secondary | ICD-10-CM | POA: Diagnosis not present

## 2017-11-01 ENCOUNTER — Ambulatory Visit: Payer: Self-pay | Admitting: Family Medicine

## 2017-11-18 ENCOUNTER — Encounter: Payer: Self-pay | Admitting: Family Medicine

## 2017-12-10 DIAGNOSIS — M722 Plantar fascial fibromatosis: Secondary | ICD-10-CM | POA: Diagnosis not present

## 2018-03-03 ENCOUNTER — Ambulatory Visit: Payer: 59 | Admitting: Family Medicine

## 2018-03-03 ENCOUNTER — Encounter: Payer: Self-pay | Admitting: Family Medicine

## 2018-03-03 VITALS — BP 118/88 | HR 74 | Temp 98.4°F | Ht 66.0 in | Wt 182.2 lb

## 2018-03-03 DIAGNOSIS — Z8679 Personal history of other diseases of the circulatory system: Secondary | ICD-10-CM

## 2018-03-03 DIAGNOSIS — Z1283 Encounter for screening for malignant neoplasm of skin: Secondary | ICD-10-CM

## 2018-03-03 DIAGNOSIS — M79672 Pain in left foot: Secondary | ICD-10-CM

## 2018-03-03 DIAGNOSIS — E785 Hyperlipidemia, unspecified: Secondary | ICD-10-CM

## 2018-03-03 DIAGNOSIS — R7303 Prediabetes: Secondary | ICD-10-CM

## 2018-03-03 LAB — COMPREHENSIVE METABOLIC PANEL
ALBUMIN: 4.3 g/dL (ref 3.5–5.2)
ALT: 9 U/L (ref 0–35)
AST: 16 U/L (ref 0–37)
Alkaline Phosphatase: 58 U/L (ref 39–117)
BUN: 9 mg/dL (ref 6–23)
CALCIUM: 9.6 mg/dL (ref 8.4–10.5)
CO2: 27 mEq/L (ref 19–32)
Chloride: 103 mEq/L (ref 96–112)
Creatinine, Ser: 0.75 mg/dL (ref 0.40–1.20)
GFR: 89.2 mL/min (ref >60.00)
Glucose, Bld: 111 mg/dL — ABNORMAL HIGH (ref 70–99)
POTASSIUM: 4.1 meq/L (ref 3.5–5.1)
Sodium: 139 mEq/L (ref 135–145)
Total Bilirubin: 0.6 mg/dL (ref 0.2–1.2)
Total Protein: 7 g/dL (ref 6.0–8.3)

## 2018-03-03 LAB — LIPID PANEL
CHOLESTEROL: 158 mg/dL (ref 0–200)
HDL: 53.7 mg/dL (ref >39.00)
LDL CALC: 88 mg/dL (ref 0–99)
NonHDL: 103.95
TRIGLYCERIDES: 78 mg/dL (ref 0.0–149.0)
Total CHOL/HDL Ratio: 3
VLDL: 15.6 mg/dL (ref 0.0–40.0)

## 2018-03-03 LAB — CBC
HCT: 38.3 % (ref 36.0–46.0)
Hemoglobin: 12.9 g/dL (ref 12.0–15.0)
MCHC: 33.8 g/dL (ref 30.0–36.0)
MCV: 94.8 fl (ref 78.0–100.0)
PLATELETS: 195 10*3/uL (ref 150.0–400.0)
RBC: 4.05 Mil/uL (ref 3.87–5.11)
RDW: 12.4 % (ref 11.5–15.5)
WBC: 7.2 10*3/uL (ref 4.0–10.5)

## 2018-03-03 LAB — HEMOGLOBIN A1C: Hgb A1c MFr Bld: 5.6 % (ref 4.6–6.5)

## 2018-03-03 LAB — TSH: TSH: 0.84 u[IU]/mL (ref 0.35–4.50)

## 2018-03-03 NOTE — Progress Notes (Signed)
Tommi Rumps, MD Phone: [phone]  Anna Kim is a 42 y.o. female who presents today for follow-up.  CC: History of hypertension, hyperlipidemia, foot pain, dermatology referral  History of hypertension: Patient was previously on HCTZ though blood pressure had come down into the normal range off of medication.  This was discontinued due to hypokalemia.  She notes on 2 occasions over the last couple of months she has had episodes where she just did not feel right.  Her face would turn red.  She would check her blood pressure and it would be around 190/100.  She notes the first time this improved back to normal range within 30 minutes.  The second time she took an HCTZ and her return to 120/80 quickly.  She notes no chest pain, shortness of breath, or edema.  No syncope or headaches with these.  Possible lightheadedness.  She notes since these episodes have occurred she has started checking her blood pressure and it is typically around 120/80.  Hyperlipidemia: Taking pravastatin.  No right upper quadrant pain or myalgias.  Foot pain: Patient notes chronic issues with plantar fasciitis in her left foot.  She notes occasional burning discomfort near her heel on the medial aspect that will just start hurting at times without putting pressure on her foot.  She has seen podiatry and undergone multiple injections and was in a boot for 2 weeks previously.  Her symptoms did improve with wearing the boot though they have recurred.  She ices and stretches.  She needs a referral to podiatry.  Dermatology referral: Patient requested dermatology referral for yearly screening skin check.  She has seen dermatology previously for this.  Social History   Tobacco Use  Smoking Status Never Smoker  Smokeless Tobacco Never Used     ROS see history of present illness  Objective  Physical Exam Vitals:   03/03/18 1105  BP: 118/88  Pulse: 74  Temp: 98.4 F (36.9 C)  SpO2: 96%    BP Readings  from Last 3 Encounters:  03/03/18 118/88  04/30/17 118/78  03/29/17 132/81   Wt Readings from Last 3 Encounters:  03/03/18 182 lb 3.2 oz (82.6 kg)  04/30/17 176 lb (79.8 kg)  03/29/17 174 lb (78.9 kg)    Physical Exam  Constitutional: No distress.  Cardiovascular: Normal rate, regular rhythm and normal heart sounds.  Pulmonary/Chest: Effort normal and breath sounds normal.  Musculoskeletal: She exhibits no edema.  Left foot with no specific tenderness or swelling, 2+ DP pulse  Neurological: She is alert.  Skin: Skin is warm and dry. She is not diaphoretic.     Assessment/Plan: Please see individual problem list.  History of hypertension Recently with 2 episodes of elevated blood pressure.  Otherwise her blood pressure has been at goal.  We will check lab work.  She will monitor her blood pressure.  If she continues to have issues with blood pressure running elevated we could trial a low-dose of amlodipine.  Hyperlipidemia Check lipid panel.  Left foot pain Refer to podiatry.  Skin exam, screening for cancer Refer to dermatology at patient request.  Prediabetes Check A1c.    Orders Placed This Encounter  Procedures  . Comp Met (CMET)  . TSH  . CBC  . HgB A1c  . Lipid panel  . Ambulatory referral to Podiatry    Referral Priority:   Routine    Referral Type:   Consultation    Referral Reason:   Specialty Services Required  Requested Specialty:   Podiatry    Number of Visits Requested:   1  . Ambulatory referral to Dermatology    Referral Priority:   Routine    Referral Type:   Consultation    Referral Reason:   Specialty Services Required    Requested Specialty:   Dermatology    Number of Visits Requested:   1    No orders of the defined types were placed in this encounter.    Tommi Rumps, MD [place]

## 2018-03-03 NOTE — Patient Instructions (Signed)
Nice to see you. We will check lab work today and contact you with the results. We will get you to see podiatry and dermatology.

## 2018-03-04 DIAGNOSIS — Z1283 Encounter for screening for malignant neoplasm of skin: Secondary | ICD-10-CM | POA: Insufficient documentation

## 2018-03-04 DIAGNOSIS — M79672 Pain in left foot: Secondary | ICD-10-CM | POA: Insufficient documentation

## 2018-03-04 HISTORY — DX: Encounter for screening for malignant neoplasm of skin: Z12.83

## 2018-03-04 NOTE — Assessment & Plan Note (Signed)
Refer to podiatry

## 2018-03-04 NOTE — Assessment & Plan Note (Signed)
Check lipid panel  

## 2018-03-04 NOTE — Assessment & Plan Note (Signed)
Recently with 2 episodes of elevated blood pressure.  Otherwise her blood pressure has been at goal.  We will check lab work.  She will monitor her blood pressure.  If she continues to have issues with blood pressure running elevated we could trial a low-dose of amlodipine.

## 2018-03-04 NOTE — Assessment & Plan Note (Signed)
Refer to dermatology at patient request.

## 2018-03-04 NOTE — Assessment & Plan Note (Signed)
Check A1c. 

## 2018-04-01 ENCOUNTER — Encounter: Payer: Self-pay | Admitting: Family Medicine

## 2018-04-01 DIAGNOSIS — M722 Plantar fascial fibromatosis: Secondary | ICD-10-CM

## 2018-04-03 ENCOUNTER — Other Ambulatory Visit: Payer: Self-pay | Admitting: Obstetrics and Gynecology

## 2018-04-03 ENCOUNTER — Other Ambulatory Visit: Payer: Self-pay | Admitting: Family Medicine

## 2018-04-03 ENCOUNTER — Ambulatory Visit (INDEPENDENT_AMBULATORY_CARE_PROVIDER_SITE_OTHER): Payer: No Typology Code available for payment source | Admitting: Obstetrics and Gynecology

## 2018-04-03 ENCOUNTER — Encounter: Payer: Self-pay | Admitting: Obstetrics and Gynecology

## 2018-04-03 VITALS — BP 132/84 | HR 76 | Ht 66.0 in | Wt 180.6 lb

## 2018-04-03 DIAGNOSIS — Z01419 Encounter for gynecological examination (general) (routine) without abnormal findings: Secondary | ICD-10-CM | POA: Diagnosis not present

## 2018-04-03 MED ORDER — NORETHINDRONE ACET-ETHINYL EST 1.5-30 MG-MCG PO TABS: 1.0000 | ORAL_TABLET | Freq: Every day | ORAL | 4 refills | Status: DC

## 2018-04-03 NOTE — Progress Notes (Signed)
Subjective:   Anna Kim is a 43 y.o. G24P0 Caucasian female here for a routine well-woman exam.  No LMP recorded. (Menstrual status: Oral contraceptives).    Current complaints: none PCP: [place]       Doesn't need labs  Social History: Sexual: heterosexual Marital Status: divorced Living situation: with daughter Occupation: Therapist, music at [employer] Tobacco/alcohol: no tobacco use Illicit drugs: no history of illicit drug use  The following portions of the patient's history were reviewed and updated as appropriate: allergies, current medications, past family history, past medical history, past social history, past surgical history and problem list.  Past Medical History Past Medical History:  Diagnosis Date  . HLD (hyperlipidemia)   . HTN (hypertension)   . Migraine   . Ureteral stone   . Vaginal Pap smear, abnormal    Remote history of 1 abnormal pap    Past Surgical History Past Surgical History:  Procedure Laterality Date  . TONSILLECTOMY  2011    Gynecologic History G1P0  No LMP recorded. (Menstrual status: Oral contraceptives). Contraception: OCP (estrogen/progesterone) Last Pap: 2017. Results were: normal Last mammogram: 05/2017. Results were: normal   Obstetric History OB History  Gravida Para Term Preterm AB Living  1         1  SAB TAB Ectopic Multiple Live Births          1    # Outcome Date GA Lbr Len/2nd Weight Sex Delivery Anes PTL Lv  1 Gravida 2006    F Vag-Spont   LIV    Current Medications Current Outpatient Medications on File Prior to Visit  Medication Sig Dispense Refill  . Calcium Carb-Cholecalciferol (CALCIUM + D3 PO) Take 1 tablet by mouth daily.    . cetirizine (ZYRTEC) 5 MG tablet Take by mouth.    . Norethindrone Acetate-Ethinyl Estradiol (LOESTRIN 1.5/30, 21,) 1.5-30 MG-MCG tablet Take 1 tablet by mouth daily. 3 Package 4  . Omega-3 Fatty Acids (FISH OIL) 1000 MG CAPS Take 1,000 mg by mouth daily.    . pravastatin  (PRAVACHOL) 40 MG tablet Take 1 tablet (40 mg total) by mouth daily. 90 tablet 3   No current facility-administered medications on file prior to visit.     Review of Systems Patient denies any headaches, blurred vision, shortness of breath, chest pain, abdominal pain, problems with bowel movements, urination, or intercourse.  Objective:  BP 132/84   Pulse 76   Ht 5\' 6"  (1.676 m)   Wt 180 lb 9.6 oz (81.9 kg)   BMI 29.15 kg/m  Physical Exam  General:  Well developed, well nourished, no acute distress. She is alert and oriented x3. Skin:  Warm and dry Neck:  Midline trachea, no thyromegaly or nodules Cardiovascular: Regular rate and rhythm, no murmur heard Lungs:  Effort normal, all lung fields clear to auscultation bilaterally Breasts:  No dominant palpable mass, retraction, or nipple discharge Abdomen:  Soft, non tender, no hepatosplenomegaly or masses Pelvic:  External genitalia is normal in appearance.  The vagina is normal in appearance. The cervix is bulbous, no CMT.  Thin prep pap is not done . Uterus is felt to be normal size, shape, and contour.  No adnexal masses or tenderness noted. Extremities:  No swelling or varicosities noted Psych:  She has a normal mood and affect  Assessment:   Healthy well-woman exam  Plan:   F/U 1 year for AE, or sooner if needed Mammogram ordered  Karlen Barbar Rockney Ghee, CNM

## 2018-04-03 NOTE — Patient Instructions (Signed)
 Preventive Care 18-39 Years, Female Preventive care refers to lifestyle choices and visits with your health care provider that can promote health and wellness. What does preventive care include?   A yearly physical exam. This is also called an annual well check.  Dental exams once or twice a year.  Routine eye exams. Ask your health care provider how often you should have your eyes checked.  Personal lifestyle choices, including: ? Daily care of your teeth and gums. ? Regular physical activity. ? Eating a healthy diet. ? Avoiding tobacco and drug use. ? Limiting alcohol use. ? Practicing safe sex. ? Taking vitamin and mineral supplements as recommended by your health care provider. What happens during an annual well check? The services and screenings done by your health care provider during your annual well check will depend on your age, overall health, lifestyle risk factors, and family history of disease. Counseling Your health care provider may ask you questions about your:  Alcohol use.  Tobacco use.  Drug use.  Emotional well-being.  Home and relationship well-being.  Sexual activity.  Eating habits.  Work and work environment.  Method of birth control.  Menstrual cycle.  Pregnancy history. Screening You may have the following tests or measurements:  Height, weight, and BMI.  Diabetes screening. This is done by checking your blood sugar (glucose) after you have not eaten for a while (fasting).  Blood pressure.  Lipid and cholesterol levels. These may be checked every 5 years starting at age 20.  Skin check.  Hepatitis C blood test.  Hepatitis B blood test.  Sexually transmitted disease (STD) testing.  BRCA-related cancer screening. This may be done if you have a family history of breast, ovarian, tubal, or peritoneal cancers.  Pelvic exam and Pap test. This may be done every 3 years starting at age 21. Starting at age 30, this may be done  every 5 years if you have a Pap test in combination with an HPV test. Discuss your test results, treatment options, and if necessary, the need for more tests with your health care provider. Vaccines Your health care provider may recommend certain vaccines, such as:  Influenza vaccine. This is recommended every year.  Tetanus, diphtheria, and acellular pertussis (Tdap, Td) vaccine. You may need a Td booster every 10 years.  Varicella vaccine. You may need this if you have not been vaccinated.  HPV vaccine. If you are 26 or younger, you may need three doses over 6 months.  Measles, mumps, and rubella (MMR) vaccine. You may need at least one dose of MMR. You may also need a second dose.  Pneumococcal 13-valent conjugate (PCV13) vaccine. You may need this if you have certain conditions and were not previously vaccinated.  Pneumococcal polysaccharide (PPSV23) vaccine. You may need one or two doses if you smoke cigarettes or if you have certain conditions.  Meningococcal vaccine. One dose is recommended if you are age 19-21 years and a first-year college student living in a residence hall, or if you have one of several medical conditions. You may also need additional booster doses.  Hepatitis A vaccine. You may need this if you have certain conditions or if you travel or work in places where you may be exposed to hepatitis A.  Hepatitis B vaccine. You may need this if you have certain conditions or if you travel or work in places where you may be exposed to hepatitis B.  Haemophilus influenzae type b (Hib) vaccine. You may need this if   you have certain risk factors. Talk to your health care provider about which screenings and vaccines you need and how often you need them. This information is not intended to replace advice given to you by your health care provider. Make sure you discuss any questions you have with your health care provider. Document Released: 05/08/2001 Document Revised:  10/23/2016 Document Reviewed: 01/11/2015 Elsevier Interactive Patient Education  2019 [employer]  

## 2018-05-15 ENCOUNTER — Ambulatory Visit (INDEPENDENT_AMBULATORY_CARE_PROVIDER_SITE_OTHER): Payer: Self-pay | Admitting: Physician Assistant

## 2018-05-15 VITALS — BP 130/92 | HR 76 | Temp 98.2°F | Resp 16 | Wt 179.0 lb

## 2018-05-15 DIAGNOSIS — R05 Cough: Secondary | ICD-10-CM

## 2018-05-15 DIAGNOSIS — R0982 Postnasal drip: Secondary | ICD-10-CM

## 2018-05-15 DIAGNOSIS — R059 Cough, unspecified: Secondary | ICD-10-CM

## 2018-05-15 DIAGNOSIS — R0981 Nasal congestion: Secondary | ICD-10-CM

## 2018-05-15 MED ORDER — SALINE SPRAY 0.65 % NA SOLN: 1.0000 | NASAL | 0 refills | Status: DC | PRN

## 2018-05-15 MED ORDER — LORATADINE-PSEUDOEPHEDRINE ER 10-240 MG PO TB24: 1.0000 | ORAL_TABLET | Freq: Every day | ORAL | 0 refills | Status: DC

## 2018-05-15 MED ORDER — FLUTICASONE PROPIONATE 50 MCG/ACT NA SUSP: 2.0000 | Freq: Every day | NASAL | 0 refills | Status: DC

## 2018-05-15 MED ORDER — PREDNISONE 50 MG PO TABS: 50.0000 mg | ORAL_TABLET | Freq: Every day | ORAL | 0 refills | Status: AC

## 2018-05-15 NOTE — Progress Notes (Addendum)
Patient ID: AMAHIA MADONIA DOB: [birth year] AGE: 43 y.o. MRN: [id number]   PCP: Leone Haven, MD   Chief Complaint:  Chief Complaint  Patient presents with  . Nasal Congestion    x5d     Subjective:    HPI:  TRAYONNA BACHMEIER is a 43 y.o. female presents for evaluation  Chief Complaint  Patient presents with  . Nasal Congestion    x22d    43 year old female presents to [facility] with one week history of URI symptoms. Began with scratchy/irritated throat. Then developed rhinorrhea and nasal congestion. Nasal congestion persisting. Took OTC Sudafed, Robitussin-DM, and used Afrin (once) with no improvement. Over past few days has also had postnasal drip and cough (only at night and in the early morning due to PND). Nasal discharge became thick and green 2 days ago. States symptoms are not improving or worsening, just persistent. States typically she only has URI symptoms for 2-3 days. Denies loss of smell, dental pain/pain with mastication, epistaxis. Denies fever, chills, headache, ear pain, maxillary sinus pain, chest pain, SOB, wheezing, nausea/vomiting, diarrhea.  Patient with seasonal allergies. Typically Spring. Normally takes Claritin daily; ran out one month ago, has not resumed anti-histamine.  Patient regularly followed by Dr. Tommi Rumps MD with [facility]. Last seen 03/03/2018. Patient followed for hypertension and hyperlipidemia. In regards to HTN, failed HCTZ due to hyperkalemia and side effects. Currently controlled with diet and exercise. Disucsion of starting Amlodipine. Patient on Pravastatin for hyperlipidemia.  Patient undergoing PT for left foot pain due to plantar fasciitis. PT at [facility], ordered by Dr. Samara Deist DPM.  A limited review of symptoms was performed, pertinent positives and negatives as mentioned in HPI.  The following portions of the patient's history were reviewed and updated as  appropriate: allergies, current medications and past medical history.  Patient Active Problem List   Diagnosis Date Noted  . Left foot pain 03/04/2018  . Skin exam, screening for cancer 03/04/2018  . Hypokalemia 01/25/2017  . 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 Encounter for health maintenance examination with abnormal findings 11/16/2016  . History of hypertension 01/20/2016  . Prediabetes 01/20/2016  . Hyperlipidemia 01/20/2016    Allergies  Allergen Reactions  . Ceftriaxone Hives  . Erythromycin Other (See Comments)    GI Upset  . Sulfa Antibiotics Rash    Current Outpatient Medications on File Prior to Visit  Medication Sig Dispense Refill  . Calcium Carb-Cholecalciferol (CALCIUM + D3 PO) Take 1 tablet by mouth daily.    . Norethindrone Acetate-Ethinyl Estradiol (LOESTRIN 1.5/30, 21,) 1.5-30 MG-MCG tablet Take 1 tablet by mouth daily. 3 Package 4  . Omega-3 Fatty Acids (FISH OIL) 1000 MG CAPS Take 1,000 mg by mouth daily.    . pravastatin (PRAVACHOL) 40 MG tablet TAKE 1 TABLET (40 MG TOTAL) BY MOUTH DAILY. 90 tablet 3  . cetirizine (ZYRTEC) 5 MG tablet Take by mouth.     No current facility-administered medications on file prior to visit.        Objective:   Vitals:   05/15/18 1101  BP: (!) 130/92  Pulse: 76  Resp: 16  Temp: 98.2 F (36.8 C)  SpO2: 98%     Wt Readings from Last 3 Encounters:  05/15/18 179 lb (81.2 kg)  04/03/18 180 lb 9.6 oz (81.9 kg)  03/03/18 182 lb 3.2 oz (82.6 kg)    Physical Exam:   General Appearance:  Patient sitting comfortably on examination  table. Conversational. Good self-historian. In no acute distress. Afebrile.   Head:  Normocephalic, without obvious abnormality, atraumatic  Eyes:  PERRL, conjunctiva/corneas clear, EOM's intact  Ears:  Bilateral ear canals WNL. No erythema or edema. No discharge/drainage. Bilateral TMs WNL. No erythema, injection, or serous effusion. No scar tissue.  Nose: Nares normal, septum midline.  Nasal mucosa with minimal edema; no obstruction. No visible rhinorrhea. No erythema. Nasally sounding voice. No sinus tenderness with percussion/palpation.  Throat: Lips, mucosa, and tongue normal; teeth and gums normal. Throat reveals no erythema. Tonsils are surgically absent.  Neck: Supple, symmetrical, trachea midline, no adenopathy  Lungs:   Clear to auscultation bilaterally, respirations unlabored  Heart:  Regular rate and rhythm, S1 and S2 normal, no murmur, rub, or gallop  Extremities: Extremities normal, atraumatic, no cyanosis or edema  Pulses: 2+ and symmetric  Skin: Skin color, texture, turgor normal, no rashes or lesions  Lymph nodes: Cervical, supraclavicular, and axillary nodes normal  Neurologic: Normal    Assessment & Plan:    Exam findings, diagnosis etiology and medication use and indications reviewed with patient. Follow-Up and discharge instructions provided. No emergent/urgent issues found on exam.  Patient education was provided.   Patient verbalized understanding of information provided and agrees with plan of care (POC), all questions answered. The patient is advised to call or return to clinic if condition does not see an improvement in symptoms, or to seek the care of the closest emergency department if condition worsens with the below plan.    1. Nasal congestion - fluticasone (FLONASE) 50 MCG/ACT nasal spray; Place 2 sprays into both nostrils daily.  Dispense: 16 g; Refill: 0 - loratadine-pseudoephedrine (CLARITIN-D 24 HOUR) 10-240 MG 24 hr tablet; Take 1 tablet by mouth daily.  Dispense: 7 tablet; Refill: 0 - predniSONE (DELTASONE) 50 MG tablet; Take 1 tablet (50 mg total) by mouth daily with breakfast for 5 days.  Dispense: 5 tablet; Refill: 0 - sodium chloride (OCEAN) 0.65 % SOLN nasal spray; Place 1 spray into both nostrils as needed for congestion.  Dispense: 30 mL; Refill: 0  2. Postnasal drip  3. Cough  Patient with one week history of URI symptoms;  primary complaint nasal congestion. Has failed Sudafed and one time use of Afrin nasal spray. No indication of acute bacterial sinusitis. VSS, afebrile, in no acute distress, benign physical exam. Prescribed prednisone 50mg  qd x 5 days, Flonase nasal spray, and saline nasal spray. Advised patient increase fluids, apply warm compresses over sinuses, take hot/steamy showers. Agreed to prescribe patient antibiotic (Augmentin 875-125mg  bid x 7 days, and Diflucan for possible vaginal yeast infection s/p antibiotic) in three days, it patient develops maxillary sinus pain or no improvement in symptoms. Patient agreed with plan.   Darlin Priestly, MHS, PA-C Montey Hora, MHS, PA-C Advanced Practice Provider [facility]  [address], [facility], [place], [place] [address] (p):  [phone] [email or web address]

## 2018-05-15 NOTE — Patient Instructions (Addendum)
Thank you for choosing [facility] for your health care needs.  You have been diagnosed with nasal congestion; most likely due to viral upper respiratory infection (a cold).  Take prescription medication: Meds ordered this encounter  Medications  . fluticasone (FLONASE) 50 MCG/ACT nasal spray    Sig: Place 2 sprays into both nostrils daily.    Dispense:  16 g    Refill:  0    Order Specific Question:   Supervising Provider    Answer:   MILLER, BRIAN [[id number]]  . loratadine-pseudoephedrine (CLARITIN-D 24 HOUR) 10-240 MG 24 hr tablet    Sig: Take 1 tablet by mouth daily.    Dispense:  7 tablet    Refill:  0    Order Specific Question:   Supervising Provider    Answer:   MILLER, BRIAN [[id number]]  . predniSONE (DELTASONE) 50 MG tablet    Sig: Take 1 tablet (50 mg total) by mouth daily with breakfast for 5 days.    Dispense:  5 tablet    Refill:  0    Order Specific Question:   Supervising Provider    Answer:   MILLER, BRIAN [[id number]]  . sodium chloride (OCEAN) 0.65 % SOLN nasal spray    Sig: Place 1 spray into both nostrils as needed for congestion.    Dispense:  30 mL    Refill:  0    Order Specific Question:   Supervising Provider    Answer:   Sabra Heck, BRIAN [[id number]]   Take prednisone with food to prevent stomach upset. If you develop stomach upset, may use over the counter Pepcid (famotidine). May cause difficulty sleeping.  May use Afrin nasal spray: 2 sprays in each nare, once a day, x 3 days. Then STOP use.  May take over the counter Tylenol or ibuprofen for pain. May apply warm compresses over sinuses. May use cool mist humidifier in bedroom. Take hot/steamy showers and blow nose in shower.  May call [facility] in 3 days if you develop sinus pain, we can call in an antibiotic to the pharmacy for you.  Hope you feel better soon!  Nonallergic Rhinitis Nonallergic rhinitis is a condition that causes symptoms that affect the nose, such as a runny nose and a stuffed-up nose (nasal  congestion) that can make it hard to breathe through the nose. This condition is different from having an allergy (allergic rhinitis). Allergic rhinitis occurs when the body's defense system (immune system) reacts to a substance that you are allergic to (allergen), such as pollen, pet dander, mold, or dust. Nonallergic rhinitis has many similar symptoms, but it is not caused by allergens. Nonallergic rhinitis can be a short-term or long-term problem. What are the causes? This condition can be caused by many different things. Some common types of nonallergic rhinitis include: Infectious rhinitis  This is usually due to an infection in the upper respiratory tract. Vasomotor rhinitis  This is the most common type of long-term nonallergic rhinitis.  It is caused by too much blood flow through the nose, which makes the tissue inside of the nose swell.  Symptoms are often triggered by strong odors, cold air, stress, drinking alcohol, cigarette smoke, or changes in the weather. Occupational rhinitis  This type is caused by triggers in the workplace, such as chemicals, dusts, animal dander, or air pollution. Hormonal rhinitis  This type occurs in women as a result of an increase in the female hormone estrogen.  It may occur during pregnancy, puberty, and menstrual cycles.  Symptoms  improve when estrogen levels drop. Drug-induced rhinitis Several drugs can cause nonallergic rhinitis, including:  Medicines that are used to treat high blood pressure, heart disease, and Parkinson disease.  Aspirin and NSAIDs.  Over-the-counter nasal decongestant sprays. These can cause a type of nonallergic rhinitis (rhinitis medicamentosa) when they are used for more than a few days. Nonallergic rhinitis with eosinophilia syndrome (NARES)  This type is caused by having too much of a certain type of white blood cell (eosinophil). Nonallergic rhinitis can also be caused by a reaction to eating hot or spicy  foods. This does not usually cause long-term symptoms. In some cases, the cause of nonallergic rhinitis is not known. What increases the risk? You are more likely to develop this condition if:  You are 19-43 years of age.  You are a woman. Women are twice as likely to have this condition. What are the signs or symptoms? Common symptoms of this condition include:  Nasal congestion.  Runny nose.  The feeling of mucus going down the back of the throat (postnasal drip).  Trouble sleeping at night and daytime sleepiness. Less common symptoms include:  Sneezing.  Coughing.  Itchy nose.  Bloodshot eyes. How is this diagnosed? This condition may be diagnosed based on:  Your symptoms and medical history.  A physical exam.  Allergy testing to rule out allergic rhinitis. You may have skin tests or blood tests. In some cases, the health care provider may take a swab of nasal secretions to look for an increased number of eosinophils. This would be done to confirm a diagnosis of NARES. How is this treated? Treatment for this condition depends on the cause. No single treatment works for everyone. Work with your health care provider to find the best treatment for you. Treatment may include:  Avoiding the things that trigger your symptoms.  Using medicines to relieve congestion, such as: ? Steroid nasal spray. There are many types. You may need to try a few to find out which one works best. ? Decongestant medicine. This may be an oral medicine or a nasal spray. These medicines are only used for a short time.  Using medicines to relieve a runny nose. These may include antihistamine medicines or anticholinergic nasal sprays.  Surgery to remove tissue from inside the nose may be needed in severe cases if the condition has not improved after 6-12 months of medical treatment. Follow these instructions at home:  Take or use over-the-counter and prescription medicines only as told by your  health care provider. Do not stop using your medicine even if you start to feel better.  Use salt-water (saline) rinses or other solutions (nasal washes or irrigations) to wash or rinse out the inside of your nose as told by your health care provider.  Do not take NSAIDs or medicines that contain aspirin if they make your symptoms worse.  Do not drink alcohol if it makes your symptoms worse.  Do not use any tobacco products, such as cigarettes, chewing tobacco, and e-cigarettes. If you need help quitting, ask your health care provider.  Avoid secondhand smoke.  Get some exercise every day. Exercise may help reduce symptoms of nonallergic rhinitis for some people. Ask your health care provider how much exercise and what types of exercise are safe for you.  Sleep with the head of your bed raised (elevated). This may reduce nighttime nasal congestion.  Keep all follow-up visits as told by your health care provider. This is important. Contact a health care provider  if:  You have a fever.  Your symptoms are getting worse at home.  Your symptoms are not responding to medicine.  You develop new symptoms, especially a headache or nosebleed. This information is not intended to replace advice given to you by your health care provider. Make sure you discuss any questions you have with your health care provider. Document Released: 07/04/2015 Document Revised: 08/18/2015 Document Reviewed: 06/02/2015 Elsevier Interactive Patient Education  2019 [employer].

## 2018-05-21 ENCOUNTER — Telehealth: Payer: Self-pay | Admitting: Emergency Medicine

## 2018-05-21 NOTE — Telephone Encounter (Signed)
Left message following up on visit with [facility] 

## 2018-06-02 ENCOUNTER — Ambulatory Visit: Payer: Self-pay | Admitting: Family Medicine

## 2018-06-12 ENCOUNTER — Ambulatory Visit: Payer: Self-pay | Admitting: Family Medicine

## 2018-06-24 MED FILL — PRAVASTATIN NA 40 MG TAB: 40 | 90 days supply | Qty: 90 | Fill #0

## 2018-07-29 ENCOUNTER — Telehealth: Payer: Self-pay

## 2018-07-29 NOTE — Telephone Encounter (Signed)
Copied from [place] [id number]. Topic: Appointment Scheduling - Scheduling Inquiry for Clinic >> Jul 29, 2018 12:23 PM Scherrie Gerlach wrote: Reason for CRM: pt calling to change her appt to virtual.  Pt got a call to call back. No answer at the office after 3 X Pt aware she will get a text message with a link, and that she will receive a call back to confirm appt virtual

## 2018-07-29 NOTE — Telephone Encounter (Signed)
Called pt and apologized for the frustration with calling the call center back. Advised pt that appt has been switched to [facility] pt advised and voiced understanding.

## 2018-07-30 ENCOUNTER — Telehealth: Payer: Self-pay | Admitting: Family Medicine

## 2018-07-30 ENCOUNTER — Ambulatory Visit (INDEPENDENT_AMBULATORY_CARE_PROVIDER_SITE_OTHER): Payer: No Typology Code available for payment source | Admitting: Family Medicine

## 2018-07-30 ENCOUNTER — Encounter: Payer: Self-pay | Admitting: Family Medicine

## 2018-07-30 ENCOUNTER — Other Ambulatory Visit: Payer: Self-pay

## 2018-07-30 DIAGNOSIS — J309 Allergic rhinitis, unspecified: Secondary | ICD-10-CM | POA: Diagnosis not present

## 2018-07-30 DIAGNOSIS — R0981 Nasal congestion: Secondary | ICD-10-CM

## 2018-07-30 DIAGNOSIS — E785 Hyperlipidemia, unspecified: Secondary | ICD-10-CM

## 2018-07-30 DIAGNOSIS — Z8679 Personal history of other diseases of the circulatory system: Secondary | ICD-10-CM

## 2018-07-30 MED ORDER — FLUTICASONE PROPIONATE 50 MCG/ACT NA SUSP: 2.0000 | Freq: Every day | NASAL | 1 refills | Status: DC

## 2018-07-30 NOTE — Progress Notes (Signed)
Virtual Visit via video Note  This visit type was conducted due to national recommendations for restrictions regarding the COVID-19 pandemic (e.g. social distancing).  This format is felt to be most appropriate for this patient at this time.  All issues noted in this document were discussed and addressed.  No physical exam was performed (except for noted visual exam findings with Video Visits).   I connected with Anna Kim today at 11:30 AM EDT by a video enabled telemedicine application and verified that I am speaking with the correct person using two identifiers. Location patient: work Location provider: work  Persons participating in the virtual visit: patient, provider  I discussed the limitations, risks, security and privacy concerns of performing an evaluation and management service by telephone and the availability of in person appointments. I also discussed with the patient that there may be a patient responsible charge related to this service. The patient expressed understanding and agreed to proceed.   Reason for visit: follow-up  HPI: HYPERLIPIDEMIA Symptoms Chest pain on exertion:  no   Dyspnea:   no Medications: Compliance- taking pravastatin Right upper quadrant pain- no  Muscle aches- no  Allergic rhinitis: Patient notes this typically bothers her this year.  She went out to cut the grass last week and had already taken a Benadryl and subsequently developed sneezing, watery red eyes, and rhinorrhea.  She does note occasional congestion in her nose.  No cough.  No fever.  She takes Claritin daily.  She has not been using Flonase.  She did have to take a Claritin-D on one occasion.  History of hypertension: Blood pressure has been well controlled recently.  She has not noted any abnormal sensations which typically accompany her blood pressure elevation.     ROS: See pertinent positives and negatives per HPI.  Past Medical History:  Diagnosis Date  . HLD  (hyperlipidemia)   . HTN (hypertension)   . Migraine   . Ureteral stone   . Vaginal Pap smear, abnormal    Remote history of 1 abnormal pap    Past Surgical History:  Procedure Laterality Date  . TONSILLECTOMY  2011    Family History  Problem Relation Age of Onset  . Cancer Maternal Aunt        ovarian  . Hypertension Mother   . Hypertension Father   . Kidney cancer Neg Hx   . Bladder Cancer Neg Hx   . Breast cancer Neg Hx     SOCIAL HX: Non-smoker.   Current Outpatient Medications:  .  Calcium Carb-Cholecalciferol (CALCIUM + D3 PO), Take 1 tablet by mouth daily., Disp: , Rfl:  .  cetirizine (ZYRTEC) 5 MG tablet, Take by mouth., Disp: , Rfl:  .  fluticasone (FLONASE) 50 MCG/ACT nasal spray, Place 2 sprays into both nostrils daily., Disp: 16 g, Rfl: 1 .  loratadine-pseudoephedrine (CLARITIN-D 24 HOUR) 10-240 MG 24 hr tablet, Take 1 tablet by mouth daily., Disp: 7 tablet, Rfl: 0 .  Norethindrone Acetate-Ethinyl Estradiol (LOESTRIN 1.5/30, 21,) 1.5-30 MG-MCG tablet, Take 1 tablet by mouth daily., Disp: 3 Package, Rfl: 4 .  Omega-3 Fatty Acids (FISH OIL) 1000 MG CAPS, Take 1,000 mg by mouth daily., Disp: , Rfl:  .  pravastatin (PRAVACHOL) 40 MG tablet, TAKE 1 TABLET (40 MG TOTAL) BY MOUTH DAILY., Disp: 90 tablet, Rfl: 3 .  sodium chloride (OCEAN) 0.65 % SOLN nasal spray, Place 1 spray into both nostrils as needed for congestion., Disp: 30 mL, Rfl: 0  EXAM:  VITALS per patient if applicable: None.  GENERAL: alert, oriented, appears well and in no acute distress  HEENT: atraumatic, conjunttiva clear, no obvious abnormalities on inspection of external nose and ears  NECK: normal movements of the head and neck  LUNGS: on inspection no signs of respiratory distress, breathing rate appears normal, no obvious gross SOB, gasping or wheezing  CV: no obvious cyanosis  MS: moves all visible extremities without noticeable abnormality  PSYCH/NEURO: pleasant and cooperative, no  obvious depression or anxiety, speech and thought processing grossly intact  ASSESSMENT AND PLAN:  Discussed the following assessment and plan:  Nasal congestion - Plan: fluticasone (FLONASE) 50 MCG/ACT nasal spray  History of hypertension  Hyperlipidemia, unspecified hyperlipidemia type  Allergic rhinitis, unspecified seasonality, unspecified trigger  History of hypertension BP has been well controlled recently at several doctors visits.  We will periodically monitor in the office.  Hyperlipidemia Well-controlled on last check.  Continue pravastatin.  Allergic rhinitis Symptoms are consistent with allergic rhinitis.  She will add Flonase.  She will continue Claritin.  Discussed minimal use of Claritin-D and limiting use to 2 to 3 days at a time.   Social distancing precautions and sick precautions given regarding COVID-19.   I discussed the assessment and treatment plan with the patient. The patient was provided an opportunity to ask questions and all were answered. The patient agreed with the plan and demonstrated an understanding of the instructions.   The patient was advised to call back or seek an in-person evaluation if the symptoms worsen or if the condition fails to improve as anticipated.   Tommi Rumps, MD

## 2018-07-30 NOTE — Telephone Encounter (Signed)
Called pt and left a VM to call back to schedule for a 6 month f/u CRM created and sent to [facility] pool.

## 2018-07-30 NOTE — Assessment & Plan Note (Signed)
BP has been well controlled recently at several doctors visits.  We will periodically monitor in the office.

## 2018-07-30 NOTE — Progress Notes (Signed)
Pt needs an Rx for allergy medication pt is currently taking OTC Claritin. Allergies are really back with the pollen. Pt would like to know if she can take Claritin D everyday ? Pt used to have HTN but hasn't had high BP issues in a long time

## 2018-07-30 NOTE — Assessment & Plan Note (Signed)
Well-controlled on last check.  Continue pravastatin.

## 2018-07-30 NOTE — Telephone Encounter (Signed)
Please contact the patient and get her set up for follow-up in 6 months.

## 2018-07-30 NOTE — Assessment & Plan Note (Signed)
Symptoms are consistent with allergic rhinitis.  She will add Flonase.  She will continue Claritin.  Discussed minimal use of Claritin-D and limiting use to 2 to 3 days at a time.

## 2018-08-01 NOTE — Telephone Encounter (Signed)
Done pt. scheduled

## 2018-08-27 ENCOUNTER — Ambulatory Visit
Admission: RE | Admit: 2018-08-27 | Discharge: 2018-08-27 | Disposition: A | Discharge status: Home / Self Care | Payer: No Typology Code available for payment source | Source: Ambulatory Visit | Attending: Obstetrics and Gynecology | Admitting: Obstetrics and Gynecology

## 2018-08-27 ENCOUNTER — Other Ambulatory Visit: Payer: Self-pay

## 2018-08-27 DIAGNOSIS — Z1231 Encounter for screening mammogram for malignant neoplasm of breast: Secondary | ICD-10-CM | POA: Diagnosis not present

## 2018-08-27 DIAGNOSIS — Z01419 Encounter for gynecological examination (general) (routine) without abnormal findings: Secondary | ICD-10-CM | POA: Insufficient documentation

## 2018-11-04 ENCOUNTER — Other Ambulatory Visit: Payer: Self-pay

## 2018-11-04 ENCOUNTER — Ambulatory Visit (INDEPENDENT_AMBULATORY_CARE_PROVIDER_SITE_OTHER): Payer: Self-pay | Admitting: Physician Assistant

## 2018-11-04 VITALS — BP 130/90 | HR 77 | Temp 98.8°F | Resp 16 | Wt 185.0 lb

## 2018-11-04 DIAGNOSIS — N390 Urinary tract infection, site not specified: Secondary | ICD-10-CM

## 2018-11-04 LAB — POCT URINE PREGNANCY: Preg Test, Ur: NEGATIVE

## 2018-11-04 LAB — POCT URINALYSIS DIPSTICK
Bilirubin, UA: NEGATIVE
Glucose, UA: NEGATIVE
Ketones, UA: NEGATIVE
Nitrite, UA: NEGATIVE
Protein, UA: NEGATIVE
Spec Grav, UA: 1.01 (ref 1.010–1.025)
Urobilinogen, UA: 0.2 E.U./dL
pH, UA: 6.5 (ref 5.0–8.0)

## 2018-11-04 MED ORDER — NITROFURANTOIN MONOHYD MACRO 100 MG PO CAPS: 100.0000 mg | ORAL_CAPSULE | Freq: Two times a day (BID) | ORAL | 0 refills | Status: AC

## 2018-11-04 NOTE — Patient Instructions (Addendum)
Thank you for choosing [facility] for your health care needs.  You have been diagnosed with: 1. Urinary tract infection in female - POCT Urinalysis Dipstick - POCT urine pregnancy - nitrofurantoin, macrocrystal-monohydrate, (MACROBID) 100 MG capsule; Take 1 capsule (100 mg total) by mouth 2 (two) times daily for 5 days.  Dispense: 10 capsule; Refill: 0  Take antibiotic as prescribed. Take with food to help prevent stomach upset. May also use an over the counter probiotic or eat yogurt while on antibiotic.  May use over the counter Azo (pyridium) to help with symptoms. May take over the counter Tylenol or ibuprofen for pain/discomfort.  Increase fluids; water and/or cranberry juice. Empty bladder frequently.  Follow-up with family physician, Ob/Gyn, urgent care or ED in 3 days if symptoms not resolved. Seek medical attention sooner if you develop fever, chills, back ache, abdominal pain, nausea/vomiting, or other new/concerning symptom.  Hope you feel better soon!  Urinary Tract Infection, Adult A urinary tract infection (UTI) is an infection of any part of the urinary tract. The urinary tract includes:  The kidneys.  The ureters.  The bladder.  The urethra. These organs make, store, and get rid of pee (urine) in the body. What are the causes? This is caused by germs (bacteria) in your genital area. These germs grow and cause swelling (inflammation) of your urinary tract. What increases the risk? You are more likely to develop this condition if:  You have a small, thin tube (catheter) to drain pee.  You cannot control when you pee or poop (incontinence).  You are female, and: ? You use these methods to prevent pregnancy: ? A medicine that kills sperm (spermicide). ? A device that blocks sperm (diaphragm). ? You have low levels of a female hormone (estrogen). ? You are pregnant.  You have genes that add to your risk.  You are sexually active.  You take antibiotic  medicines.  You have trouble peeing because of: ? A prostate that is bigger than normal, if you are female. ? A blockage in the part of your body that drains pee from the bladder (urethra). ? A kidney stone. ? A nerve condition that affects your bladder (neurogenic bladder). ? Not getting enough to drink. ? Not peeing often enough.  You have other conditions, such as: ? Diabetes. ? A weak disease-fighting system (immune system). ? Sickle cell disease. ? Gout. ? Injury of the spine. What are the signs or symptoms? Symptoms of this condition include:  Needing to pee right away (urgently).  Peeing often.  Peeing small amounts often.  Pain or burning when peeing.  Blood in the pee.  Pee that smells bad or not like normal.  Trouble peeing.  Pee that is cloudy.  Fluid coming from the vagina, if you are female.  Pain in the belly or lower back. Other symptoms include:  Throwing up (vomiting).  No urge to eat.  Feeling mixed up (confused).  Being tired and grouchy (irritable).  A fever.  Watery poop (diarrhea). How is this treated? This condition may be treated with:  Antibiotic medicine.  Other medicines.  Drinking enough water. Follow these instructions at home:  Medicines  Take over-the-counter and prescription medicines only as told by your doctor.  If you were prescribed an antibiotic medicine, take it as told by your doctor. Do not stop taking it even if you start to feel better. General instructions  Make sure you: ? Pee until your bladder is empty. ? Do not hold pee  for a long time. ? Empty your bladder after sex. ? Wipe from front to back after pooping if you are a female. Use each tissue one time when you wipe.  Drink enough fluid to keep your pee pale yellow.  Keep all follow-up visits as told by your doctor. This is important. Contact a doctor if:  You do not get better after 1-2 days.  Your symptoms go away and then come back. Get  help right away if:  You have very bad back pain.  You have very bad pain in your lower belly.  You have a fever.  You are sick to your stomach (nauseous).  You are throwing up. Summary  A urinary tract infection (UTI) is an infection of any part of the urinary tract.  This condition is caused by germs in your genital area.  There are many risk factors for a UTI. These include having a small, thin tube to drain pee and not being able to control when you pee or poop.  Treatment includes antibiotic medicines for germs.  Drink enough fluid to keep your pee pale yellow. This information is not intended to replace advice given to you by your health care provider. Make sure you discuss any questions you have with your health care provider. Document Released: 08/29/2007 Document Revised: 02/27/2018 Document Reviewed: 09/19/2017 Elsevier Patient Education  2020 [employer].

## 2018-11-04 NOTE — Progress Notes (Signed)
Patient ID: ROGENE Kim DOB: 1975-12-31 AGE: 43 y.o. MRN: [id number]   PCP: Leone Haven, MD   Chief Complaint:  Chief Complaint  Patient presents with  . Urinary Tract Infection    x1wk     Subjective:    HPI:  Anna Kim is a 43 y.o. female presents for evaluation  Chief Complaint  Patient presents with  . Urinary Tract Infection    x63wk    43 year old female presents to [facility] with three week history of urinary symptoms. Three weeks ago, had two hours of right sided flank pain. Severe. Caused nausea/vomiting. Similar to previous episode of ureteral stone (2018, passed without surgery). Self-resolved. Had associated lower abdominal/bladder pressure and fullness sensation. Continued for several weeks. Intermittent. Over past week/few days has had increased urinary frequency, urinary urgency with sometimes scant urination, and constant sensation of needing to void. Has not taken any OTC medication for symptom relief. Denies fever, chills, current flank pain, back pain, abdominal pain, current nausea/vomiting, diarrhea, constipation, vaginal rash/discharge/pruritis, urinary incontinence, dysuria, gross hematuria, foul odor to urine. Denies concern for STD. Denies concern for pregnancy. Previous history of pyelonephritis, 2010. Presented with flank pain. Treated outpatient with IM injection of Rocephin and oral Cipro, later changed to oral Keflex. Patient has since then, had Rocephin IV, and developed allergic reaction.  A limited review of symptoms was performed, pertinent positives and negatives as mentioned in HPI.  The following portions of the patient's history were reviewed and updated as appropriate: allergies, current medications and past medical history.  Patient Active Problem List   Diagnosis Date Noted  . Allergic rhinitis 07/30/2018  . Left foot pain 03/04/2018  . Skin exam, screening for cancer 03/04/2018  . Hypokalemia 01/25/2017  .  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 Encounter for health maintenance examination with abnormal findings 11/16/2016  . History of hypertension 01/20/2016  . Prediabetes 01/20/2016  . Hyperlipidemia 01/20/2016    Allergies  Allergen Reactions  . Ceftriaxone Hives  . Erythromycin Other (See Comments)    GI Upset  . Sulfa Antibiotics Rash    Current Outpatient Medications on File Prior to Visit  Medication Sig Dispense Refill  . Calcium Carb-Cholecalciferol (CALCIUM + D3 PO) Take 1 tablet by mouth daily.    . cetirizine (ZYRTEC) 5 MG tablet Take by mouth.    . fluticasone (FLONASE) 50 MCG/ACT nasal spray Place 2 sprays into both nostrils daily. 16 g 1  . Norethindrone Acetate-Ethinyl Estradiol (LOESTRIN 1.5/30, 21,) 1.5-30 MG-MCG tablet Take 1 tablet by mouth daily. 3 Package 4  . pravastatin (PRAVACHOL) 40 MG tablet TAKE 1 TABLET (40 MG TOTAL) BY MOUTH DAILY. 90 tablet 3  . loratadine-pseudoephedrine (CLARITIN-D 24 HOUR) 10-240 MG 24 hr tablet Take 1 tablet by mouth daily. (Patient not taking: Reported on 11/04/2018) 7 tablet 0  . Omega-3 Fatty Acids (FISH OIL) 1000 MG CAPS Take 1,000 mg by mouth daily.    . sodium chloride (OCEAN) 0.65 % SOLN nasal spray Place 1 spray into both nostrils as needed for congestion. (Patient not taking: Reported on 11/04/2018) 30 mL 0   No current facility-administered medications on file prior to visit.        Objective:   Vitals:   11/04/18 1552  BP: 130/90  Pulse: 77  Resp: 16  Temp: 98.8 F (37.1 C)  SpO2: 98%     Wt Readings from Last 3 Encounters:  11/04/18 185 lb (83.9 kg)  05/15/18 179 lb (81.2  kg)  04/03/18 180 lb 9.6 oz (81.9 kg)    Physical Exam:   General Appearance:  Patient sitting comfortably on examination table. Conversational. Kermit Balo self-historian. In no acute distress. Afebrile.   Head:  Normocephalic, without obvious abnormality, atraumatic  Eyes:  PERRL, conjunctiva/corneas clear, EOM's intact  Neck: Supple,  symmetrical, trachea midline, no adenopathy  Lungs:   Clear to auscultation bilaterally, respirations unlabored. Good aeration. No rales, rhonchi, crackles or wheezing.  Heart:  Regular rate and rhythm, S1 and S2 normal, no murmur, rub, or gallop  Abdomen:   Normal to inspection. Normoactive bowel sounds. No tenderness with palpation. Mild suprapubic pressure with palpation (causes sensation of needing to void). No guarding, rigidity or rebound tenderness. No palpable organomegaly. No CVA tenderness with percussion bilaterally.  Extremities: Extremities normal, atraumatic, no cyanosis or edema  Pulses: 2+ and symmetric  Skin: Skin color, texture, turgor normal, no rashes or lesions  Lymph nodes: Cervical, supraclavicular, and axillary nodes normal  Neurologic: Normal    Assessment & Plan:    Exam findings, diagnosis etiology and medication use and indications reviewed with patient. Follow-Up and discharge instructions provided. No emergent/urgent issues found on exam.  Patient education was provided.   Patient verbalized understanding of information provided and agrees with plan of care (POC), all questions answered. The patient is advised to call or return to clinic if condition does not see an improvement in symptoms, or to seek the care of the closest emergency department if condition worsens with the below plan.    Recent Results (from the past 2160 hour(s))  POCT urine pregnancy     Status: Normal   Collection Time: 11/04/18  3:59 PM  Result Value Ref Range   Preg Test, Ur Negative Negative  POCT Urinalysis Dipstick     Status: Abnormal   Collection Time: 11/04/18  4:01 PM  Result Value Ref Range   Color, UA clear    Clarity, UA yellow    Glucose, UA Negative Negative   Bilirubin, UA neg    Ketones, UA neg    Spec Grav, UA 1.010 1.010 - 1.025   Blood, UA trace    pH, UA 6.5 5.0 - 8.0   Protein, UA Negative Negative   Urobilinogen, UA 0.2 0.2 or 1.0 E.U./dL   Nitrite, UA neg     Leukocytes, UA Moderate (2+) (A) Negative   Appearance     Odor      1. Urinary tract infection in female - POCT Urinalysis Dipstick - POCT urine pregnancy - nitrofurantoin, macrocrystal-monohydrate, (MACROBID) 100 MG capsule; Take 1 capsule (100 mg total) by mouth 2 (two) times daily for 5 days.  Dispense: 10 capsule; Refill: 70  43 year old female presents with three week history of self-limited 2hr period of right flank pain and three weeks of bladder pressure/fullness, newer onset of increased urinary frequency and urgency. UA reveals moderate leukocytes. VSS, afebrile, in no acute distress, benign abdominal exam, no CVA tenderness. Do not believe patient has current obstructing ureteral stone or pyelonephritis. Suspect patient may have had ureteral stone, passed, now has secondary cystitis/UTI. Will treat patient with 5-day course of Macrobid (due to previous Cipro resistance and known high community resistance and allergy to cephalosporins and Bactrim). Advised follow-up with PCP, Ob/Gyn, UC or ED in three days if symptoms have not resolved; sooner with any worsening symptoms such as fever, chills, back pain/flank pain, abdominal pain, nausea/vomiting, or other new/concerning symptom. Advised patient call urologist (has not seen since  ureteral stone episode in 2018) and just mention recent symptoms. Patient agreed with plan.    Darlin Priestly, MHS, PA-C Montey Hora, MHS, PA-C Advanced Practice Provider [facility]  [address], [place] [address] (p): [phone] [email or web address]

## 2018-11-06 ENCOUNTER — Telehealth: Payer: Self-pay

## 2018-11-06 NOTE — Telephone Encounter (Signed)
Patient states she is doing better, she still have some abdominal pressure which is relieve when she takes the medication, she will call on 11/07/2018 to let us know about her progress.

## 2018-11-14 IMAGING — CT CT OUTSIDE FILMS BODY
3 of 4 series · 15 of 32 positions shown, 20 images · non-contrast
Comparison: none

[Series 2: stone study woc · axial · 0.80mm/px · z∈[-372,-62]mm · 5 of 94 slices shown, 10 images]
[im 16/94  soft-tissue]
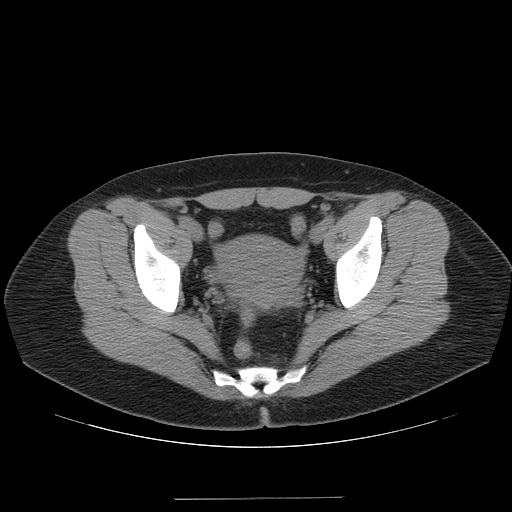
[im 16/94  bone]
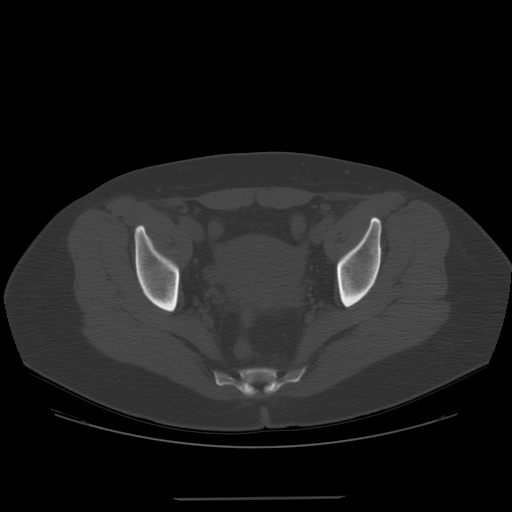
[im 32/94  soft-tissue]
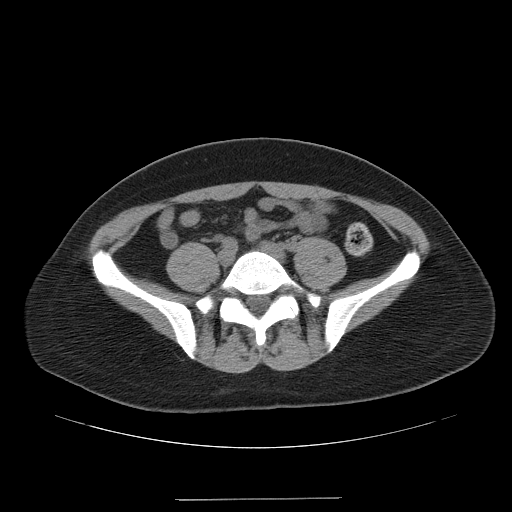
[im 32/94  lung]
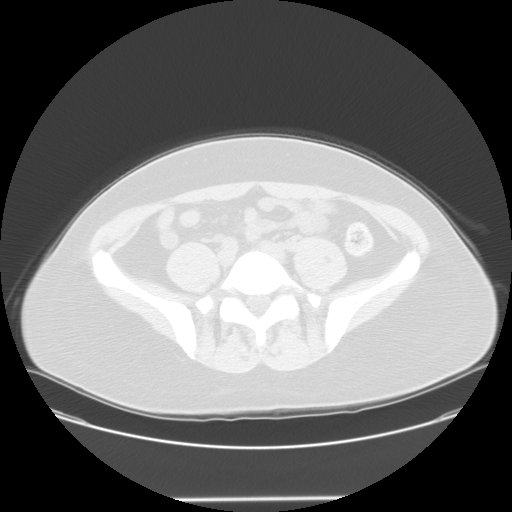
[im 47/94  soft-tissue]
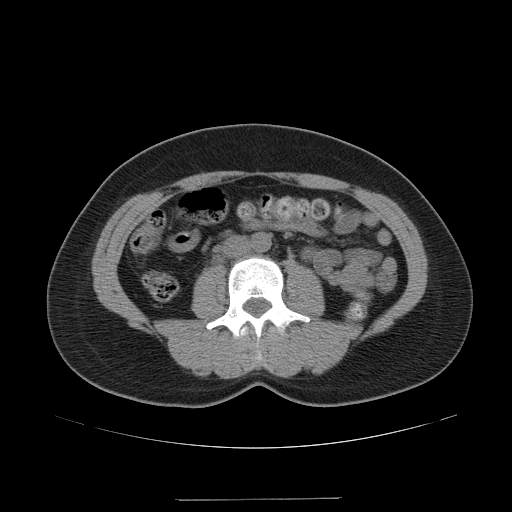
[im 47/94  lung]
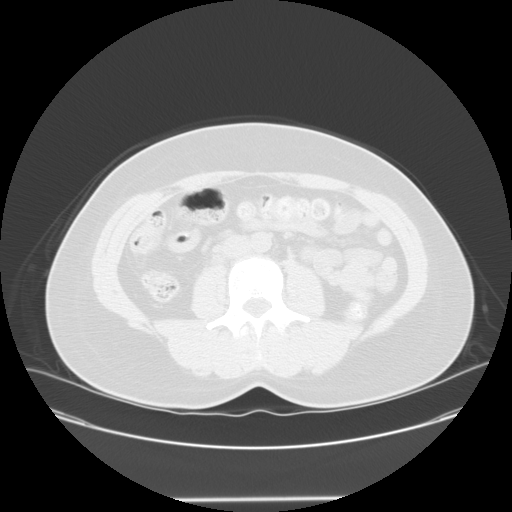
[im 63/94  soft-tissue]
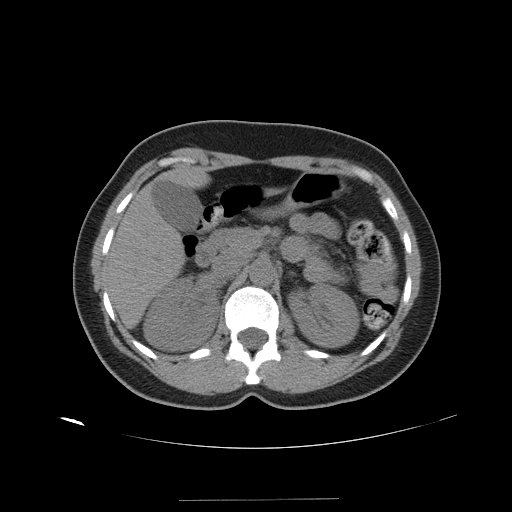
[im 63/94  lung]
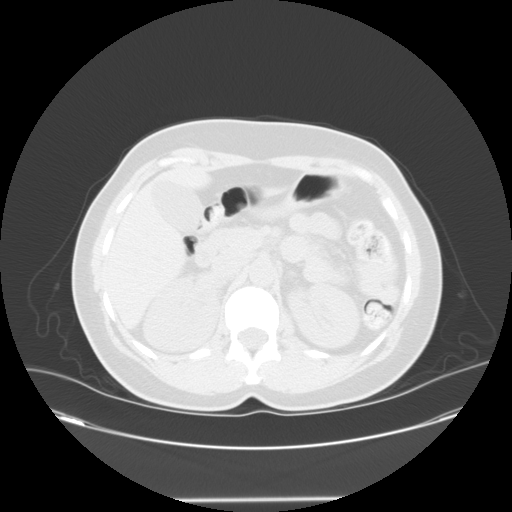
[im 78/94  soft-tissue]
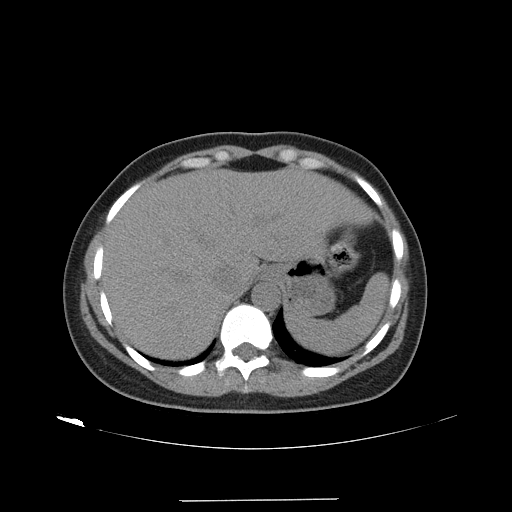
[im 78/94  lung]
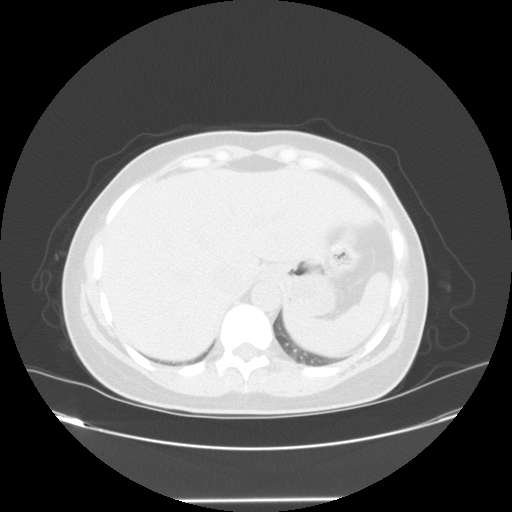

[Series 400: coronal · coronal · 0.98mm/px · 2 of 119 slices shown]
[im 15/119  soft-tissue]
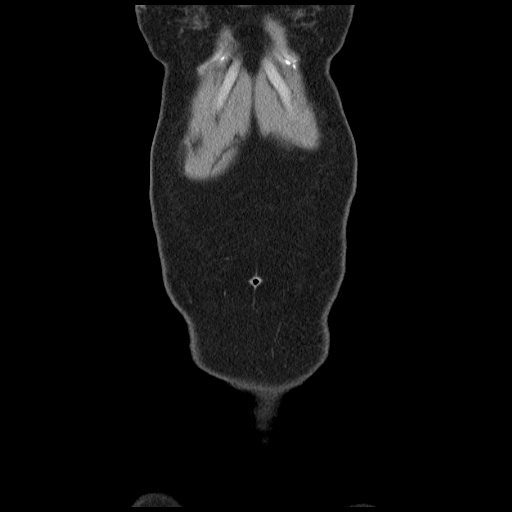
[im 30/119  soft-tissue]
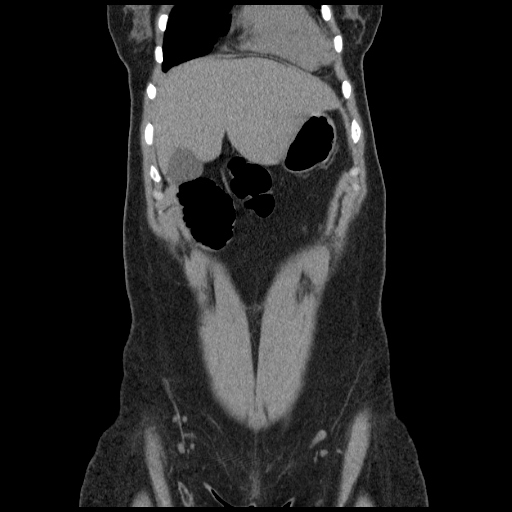

[Series 401: sagittal · sagittal · 0.98mm/px · 8 of 175 slices shown]
[im 14/175  soft-tissue]
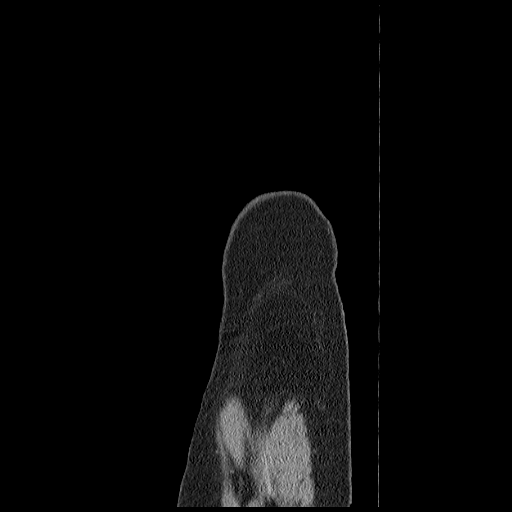
[im 41/175  soft-tissue]
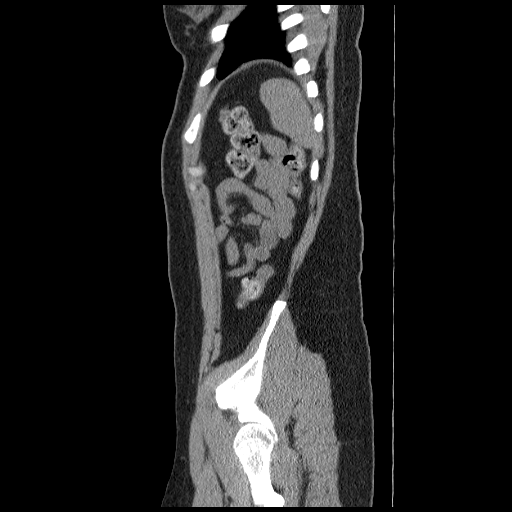
[im 54/175  soft-tissue]
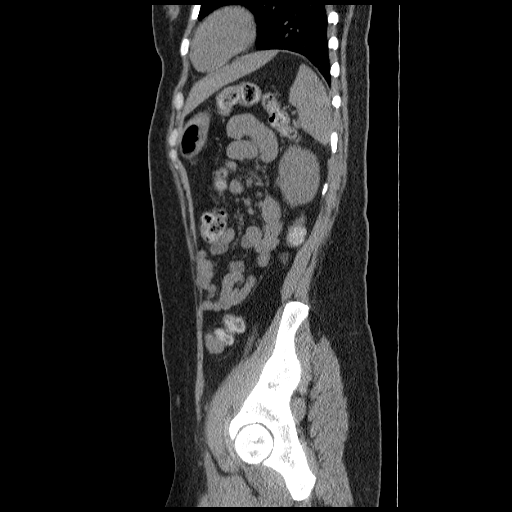
[im 81/175  soft-tissue]
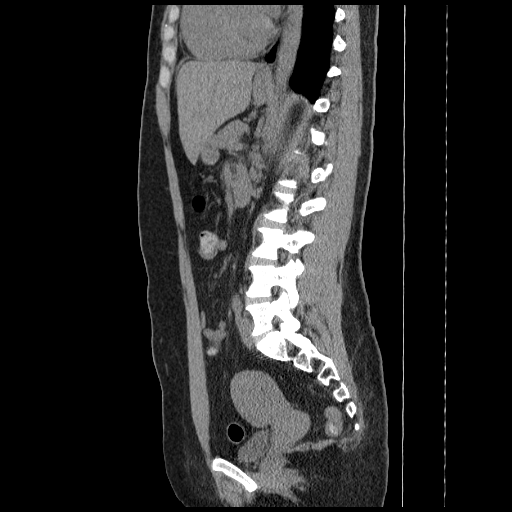
[im 94/175  soft-tissue]
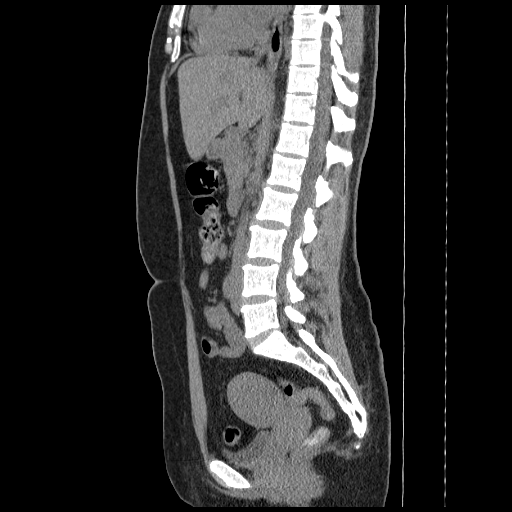
[im 121/175  soft-tissue]
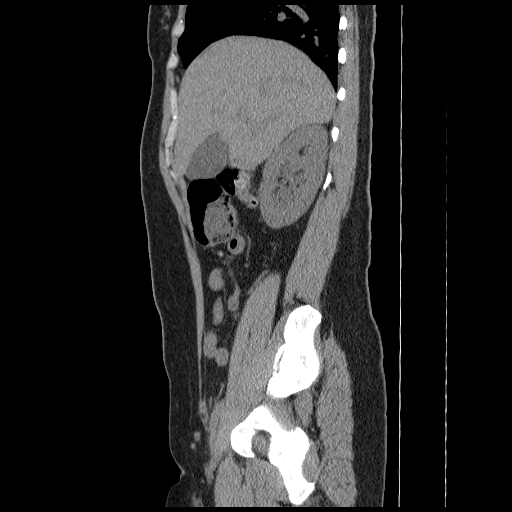
[im 134/175  soft-tissue]
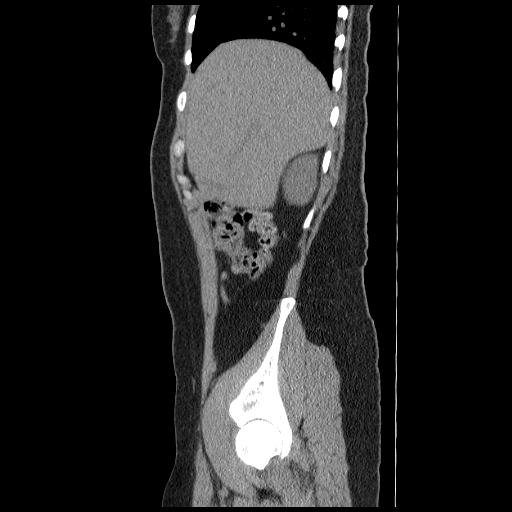
[im 161/175  soft-tissue]
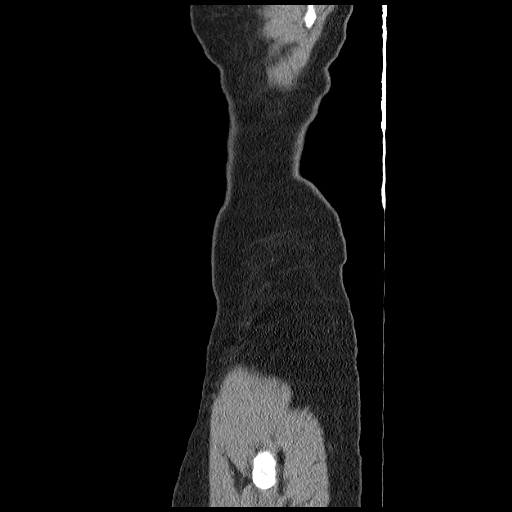

[15 of 32 positions shown; findings below may reference images not displayed]

Canned report from images found in remote index.

Refer to host system for actual result text.

## 2018-12-15 ENCOUNTER — Ambulatory Visit (INDEPENDENT_AMBULATORY_CARE_PROVIDER_SITE_OTHER): Payer: No Typology Code available for payment source | Admitting: Family Medicine

## 2018-12-15 ENCOUNTER — Other Ambulatory Visit: Payer: Self-pay

## 2018-12-15 ENCOUNTER — Encounter: Payer: Self-pay | Admitting: Family Medicine

## 2018-12-15 VITALS — Ht 66.0 in | Wt 180.0 lb

## 2018-12-15 DIAGNOSIS — R8290 Unspecified abnormal findings in urine: Secondary | ICD-10-CM | POA: Diagnosis not present

## 2018-12-15 NOTE — Progress Notes (Signed)
Virtual Visit via video Note  This visit type was conducted due to national recommendations for restrictions regarding the COVID-19 pandemic (e.g. social distancing).  This format is felt to be most appropriate for this patient at this time.  All issues noted in this document were discussed and addressed.  No physical exam was performed (except for noted visual exam findings with Video Visits).   I connected with Anna Kim today at  4:30 PM EDT by a video enabled telemedicine application and verified that I am speaking with the correct person using two identifiers. Location patient: home Location provider: work  Persons participating in the virtual visit: patient, provider  I discussed the limitations, risks, security and privacy concerns of performing an evaluation and management service by telephone and the availability of in person appointments. I also discussed with the patient that there may be a patient responsible charge related to this service. The patient expressed understanding and agreed to proceed.   Reason for visit: UTI  HPI: UTI: Patient reports the first week of August she developed UTI symptoms with lower pelvic pain and urinary frequency.  She was evaluated at [facility] and they treated her with an antibiotic.  She finished that in the middle of August and then did well though had recurrent symptoms over Labor Day weekend.  Describes it as having pelvic pressure and frequent urination and then while she urinated the pressure sensation would go away.  No urgency, dysuria, hematuria, fevers, or vaginal discharge.  No other abdominal discomfort.  She does have a history of a kidney stone and wonders if she had passed a stone.  Urinalysis at [facility] revealed leukocytes and red blood cells on urine dipstick.  She reports her symptoms resolved on their own about 1.5 weeks ago.   ROS: See pertinent positives and negatives per HPI.  Past Medical History:  Diagnosis Date  .  HLD (hyperlipidemia)   . HTN (hypertension)   . Migraine   . Ureteral stone   . Vaginal Pap smear, abnormal    Remote history of 1 abnormal pap    Past Surgical History:  Procedure Laterality Date  . TONSILLECTOMY  2011    Family History  Problem Relation Age of Onset  . Cancer Maternal Aunt        ovarian  . Hypertension Mother   . Hypertension Father   . Kidney cancer Neg Hx   . Bladder Cancer Neg Hx   . Breast cancer Neg Hx     SOCIAL HX: Non-smoker.   Current Outpatient Medications:  .  Calcium Carb-Cholecalciferol (CALCIUM + D3 PO), Take 1 tablet by mouth daily., Disp: , Rfl:  .  cetirizine (ZYRTEC) 5 MG tablet, Take by mouth., Disp: , Rfl:  .  fluticasone (FLONASE) 50 MCG/ACT nasal spray, Place 2 sprays into both nostrils daily., Disp: 16 g, Rfl: 1 .  loratadine-pseudoephedrine (CLARITIN-D 24 HOUR) 10-240 MG 24 hr tablet, Take 1 tablet by mouth daily., Disp: 7 tablet, Rfl: 0 .  Norethindrone Acetate-Ethinyl Estradiol (LOESTRIN 1.5/30, 21,) 1.5-30 MG-MCG tablet, Take 1 tablet by mouth daily., Disp: 3 Package, Rfl: 4 .  Omega-3 Fatty Acids (FISH OIL) 1000 MG CAPS, Take 1,000 mg by mouth daily., Disp: , Rfl:  .  pravastatin (PRAVACHOL) 40 MG tablet, TAKE 1 TABLET (40 MG TOTAL) BY MOUTH DAILY., Disp: 90 tablet, Rfl: 3 .  sodium chloride (OCEAN) 0.65 % SOLN nasal spray, Place 1 spray into both nostrils as needed for congestion., Disp: 30 mL, Rfl:  0  EXAM:  VITALS per patient if applicable: None.  GENERAL: alert, oriented, appears well and in no acute distress  HEENT: atraumatic, conjunttiva clear, no obvious abnormalities on inspection of external nose and ears  NECK: normal movements of the head and neck  LUNGS: on inspection no signs of respiratory distress, breathing rate appears normal, no obvious gross SOB, gasping or wheezing  CV: no obvious cyanosis  MS: moves all visible extremities without noticeable abnormality  PSYCH/NEURO: pleasant and cooperative, no  obvious depression or anxiety, speech and thought processing grossly intact  ASSESSMENT AND PLAN:  Discussed the following assessment and plan:  Abnormal urinalysis Symptoms could be related to prior UTI versus kidney stone.  I suspect this is more likely to be kidney stone related given self resolution in symptoms.  We will recheck a urinalysis given blood on urine dipstick.  She will monitor for any recurrent symptoms and if they recur she will contact us.    I discussed the assessment and treatment plan with the patient. The patient was provided an opportunity to ask questions and all were answered. The patient agreed with the plan and demonstrated an understanding of the instructions.   The patient was advised to call back or seek an in-person evaluation if the symptoms worsen or if the condition fails to improve as anticipated.  Tommi Rumps, MD

## 2018-12-15 NOTE — Assessment & Plan Note (Signed)
Symptoms could be related to prior UTI versus kidney stone.  I suspect this is more likely to be kidney stone related given self resolution in symptoms.  We will recheck a urinalysis given blood on urine dipstick.  She will monitor for any recurrent symptoms and if they recur she will contact us.

## 2018-12-22 ENCOUNTER — Other Ambulatory Visit: Payer: Self-pay | Admitting: Family Medicine

## 2018-12-22 ENCOUNTER — Other Ambulatory Visit: Payer: Self-pay

## 2018-12-22 ENCOUNTER — Other Ambulatory Visit (INDEPENDENT_AMBULATORY_CARE_PROVIDER_SITE_OTHER): Payer: No Typology Code available for payment source

## 2018-12-22 DIAGNOSIS — R8290 Unspecified abnormal findings in urine: Secondary | ICD-10-CM

## 2018-12-22 LAB — POCT URINALYSIS DIPSTICK
Bilirubin, UA: NEGATIVE
Blood, UA: NEGATIVE
Glucose, UA: NEGATIVE
Ketones, UA: NEGATIVE
Nitrite, UA: NEGATIVE
Protein, UA: NEGATIVE
Spec Grav, UA: 1.01 (ref 1.010–1.025)
Urobilinogen, UA: 0.2 E.U./dL
pH, UA: 7 (ref 5.0–8.0)

## 2018-12-22 LAB — URINALYSIS, MICROSCOPIC ONLY: RBC / HPF: NONE SEEN (ref 0–?)

## 2019-01-26 ENCOUNTER — Ambulatory Visit (INDEPENDENT_AMBULATORY_CARE_PROVIDER_SITE_OTHER): Payer: No Typology Code available for payment source | Admitting: Family Medicine

## 2019-01-26 ENCOUNTER — Other Ambulatory Visit: Payer: Self-pay

## 2019-01-26 ENCOUNTER — Encounter: Payer: Self-pay | Admitting: Family Medicine

## 2019-01-26 DIAGNOSIS — Z8679 Personal history of other diseases of the circulatory system: Secondary | ICD-10-CM | POA: Diagnosis not present

## 2019-01-26 DIAGNOSIS — Z1283 Encounter for screening for malignant neoplasm of skin: Secondary | ICD-10-CM

## 2019-01-26 DIAGNOSIS — E785 Hyperlipidemia, unspecified: Secondary | ICD-10-CM | POA: Diagnosis not present

## 2019-01-26 DIAGNOSIS — R7303 Prediabetes: Secondary | ICD-10-CM | POA: Diagnosis not present

## 2019-01-26 MED ORDER — PRAVASTATIN SODIUM 40 MG PO TABS: 40.0000 mg | ORAL_TABLET | Freq: Every day | ORAL | 3 refills | Status: DC

## 2019-01-26 NOTE — Assessment & Plan Note (Signed)
Dermatology referral placed

## 2019-01-26 NOTE — Progress Notes (Signed)
Virtual Visit via video Note  This visit type was conducted due to national recommendations for restrictions regarding the COVID-19 pandemic (e.g. social distancing).  This format is felt to be most appropriate for this patient at this time.  All issues noted in this document were discussed and addressed.  No physical exam was performed (except for noted visual exam findings with Video Visits).   I connected with Anna Kim today at  8:30 AM EST by a video enabled telemedicine application and verified that I am speaking with the correct person using two identifiers. Location patient: home Location provider: work Persons participating in the virtual visit: patient, provider  I discussed the limitations, risks, security and privacy concerns of performing an evaluation and management service by telephone and the availability of in person appointments. I also discussed with the patient that there may be a patient responsible charge related to this service. The patient expressed understanding and agreed to proceed.   Reason for visit: follow-up  HPI: HYPERLIPIDEMIA Symptoms Chest pain on exertion:  no   Leg claudication:   no Medications: Compliance- taking pravastatin, omega-3 Right upper quadrant pain- no  Muscle aches- no  History of hypertension: Typically running 120/80.  Not on medication.  No chest pain or shortness of breath.  Prediabetes: No polyuria or polydipsia.  She has been eating more salads and more vegetables.  She is walking for exercise.  Skin exam: She requests a referral to dermatology as she typically sees them once a year for a whole body skin exam.     ROS: See pertinent positives and negatives per HPI.  Past Medical History:  Diagnosis Date  . History of hypertension 01/20/2016  . HLD (hyperlipidemia)   . HTN (hypertension)   . Migraine   . Ureteral stone   . Vaginal Pap smear, abnormal    Remote history of 1 abnormal pap    Past Surgical  History:  Procedure Laterality Date  . TONSILLECTOMY  2011    Family History  Problem Relation Age of Onset  . Cancer Maternal Aunt        ovarian  . Hypertension Mother   . Hypertension Father   . Kidney cancer Neg Hx   . Bladder Cancer Neg Hx   . Breast cancer Neg Hx     SOCIAL HX: Non-smoker.   Current Outpatient Medications:  .  Calcium Carb-Cholecalciferol (CALCIUM + D3 PO), Take 1 tablet by mouth daily., Disp: , Rfl:  .  cetirizine (ZYRTEC) 5 MG tablet, Take by mouth., Disp: , Rfl:  .  fluticasone (FLONASE) 50 MCG/ACT nasal spray, Place 2 sprays into both nostrils daily., Disp: 16 g, Rfl: 1 .  loratadine-pseudoephedrine (CLARITIN-D 24 HOUR) 10-240 MG 24 hr tablet, Take 1 tablet by mouth daily., Disp: 7 tablet, Rfl: 0 .  Norethindrone Acetate-Ethinyl Estradiol (LOESTRIN 1.5/30, 21,) 1.5-30 MG-MCG tablet, Take 1 tablet by mouth daily., Disp: 3 Package, Rfl: 4 .  Omega-3 Fatty Acids (FISH OIL) 1000 MG CAPS, Take 1,000 mg by mouth daily., Disp: , Rfl:  .  pravastatin (PRAVACHOL) 40 MG tablet, Take 1 tablet (40 mg total) by mouth daily., Disp: 90 tablet, Rfl: 3 .  sodium chloride (OCEAN) 0.65 % SOLN nasal spray, Place 1 spray into both nostrils as needed for congestion., Disp: 30 mL, Rfl: 0  EXAM:  VITALS per patient if applicable: None.  GENERAL: alert, oriented, appears well and in no acute distress  HEENT: atraumatic, conjunttiva clear, no obvious abnormalities on inspection of  external nose and ears  NECK: normal movements of the head and neck  LUNGS: on inspection no signs of respiratory distress, breathing rate appears normal, no obvious gross SOB, gasping or wheezing  CV: no obvious cyanosis  MS: moves all visible extremities without noticeable abnormality  PSYCH/NEURO: pleasant and cooperative, no obvious depression or anxiety, speech and thought processing grossly intact  ASSESSMENT AND PLAN:  Discussed the following assessment and plan:  History of  hypertension BP well controlled.  She will continue to periodically monitor.  Hyperlipidemia Previously adequately controlled.  Refill pravastatin.  She will have labs in about 2 months.  Prediabetes Check A1c with lab work.  She will continue to monitor her diet and walk.  Skin exam, screening for cancer Dermatology referral placed.    Patient will keep her appointment for Pap smear with GYN in January.  I discussed the assessment and treatment plan with the patient. The patient was provided an opportunity to ask questions and all were answered. The patient agreed with the plan and demonstrated an understanding of the instructions.   The patient was advised to call back or seek an in-person evaluation if the symptoms worsen or if the condition fails to improve as anticipated.    Tommi Rumps, MD

## 2019-01-26 NOTE — Assessment & Plan Note (Signed)
Check A1c with lab work.  She will continue to monitor her diet and walk.

## 2019-01-26 NOTE — Assessment & Plan Note (Signed)
Previously adequately controlled.  Refill pravastatin.  She will have labs in about 2 months.

## 2019-01-26 NOTE — Assessment & Plan Note (Signed)
BP well controlled.  She will continue to periodically monitor.

## 2019-03-06 ENCOUNTER — Ambulatory Visit
Admission: EM | Admit: 2019-03-06 | Discharge: 2019-03-06 | Disposition: A | Discharge status: Home / Self Care | Payer: No Typology Code available for payment source | Attending: Internal Medicine | Admitting: Internal Medicine

## 2019-03-06 ENCOUNTER — Encounter: Payer: Self-pay | Admitting: Emergency Medicine

## 2019-03-06 ENCOUNTER — Other Ambulatory Visit: Payer: Self-pay

## 2019-03-06 DIAGNOSIS — R030 Elevated blood-pressure reading, without diagnosis of hypertension: Secondary | ICD-10-CM | POA: Diagnosis not present

## 2019-03-06 LAB — BASIC METABOLIC PANEL
Anion gap: 7 (ref 5–15)
BUN: 8 mg/dL (ref 6–20)
CO2: 25 mmol/L (ref 22–32)
Calcium: 9 mg/dL (ref 8.9–10.3)
Chloride: 105 mmol/L (ref 98–111)
Creatinine, Ser: 0.74 mg/dL (ref 0.44–1.00)
GFR calc Af Amer: >60 mL/min (ref >60)
GFR calc non Af Amer: >60 mL/min (ref >60)
Glucose, Bld: 117 mg/dL — ABNORMAL HIGH (ref 70–99)
Potassium: 3.8 mmol/L (ref 3.5–5.1)
Sodium: 137 mmol/L (ref 135–145)

## 2019-03-06 LAB — CBC WITH DIFFERENTIAL/PLATELET
Abs Immature Granulocytes: 0.01 10*3/uL (ref 0.00–0.07)
Basophils Absolute: 0.1 10*3/uL (ref 0.0–0.1)
Basophils Relative: 1 %
Eosinophils Absolute: 0.1 10*3/uL (ref 0.0–0.5)
Eosinophils Relative: 1 %
HCT: 36 % (ref 36.0–46.0)
Hemoglobin: 12.2 g/dL (ref 12.0–15.0)
Immature Granulocytes: 0 %
Lymphocytes Relative: 31 %
Lymphs Abs: 1.9 10*3/uL (ref 0.7–4.0)
MCH: 31.1 pg (ref 26.0–34.0)
MCHC: 33.9 g/dL (ref 30.0–36.0)
MCV: 91.8 fL (ref 80.0–100.0)
Monocytes Absolute: 0.4 10*3/uL (ref 0.1–1.0)
Monocytes Relative: 6 %
Neutro Abs: 3.9 10*3/uL (ref 1.7–7.7)
Neutrophils Relative %: 61 %
Platelets: 202 10*3/uL (ref 150–400)
RBC: 3.92 MIL/uL (ref 3.87–5.11)
RDW: 13.2 % (ref 11.5–15.5)
WBC: 6.3 10*3/uL (ref 4.0–10.5)
nRBC: 0 % (ref 0.0–0.2)

## 2019-03-06 LAB — VITAMIN D 25 HYDROXY (VIT D DEFICIENCY, FRACTURES): Vit D, 25-Hydroxy: 20.93 ng/mL — ABNORMAL LOW (ref 30–100)

## 2019-03-06 NOTE — ED Triage Notes (Signed)
Patient states that she was working and felt lightheaded and states that her blood pressure was elevated when she checked it.  Patient denies any pain.

## 2019-03-06 NOTE — ED Provider Notes (Signed)
[facility]    CSN: [id number] Arrival date & time: 03/06/19  1507      History   Chief Complaint Chief Complaint  Patient presents with  . Hypertension    HPI Anna Kim is a 43 y.o. female with a history of hyperlipidemia comes to urgent care to be evaluated for elevated blood pressure.  Patient was at work and suddenly felt unwell.  She describes a flushed feeling over her face and her torso.  Blood pressure was checked and it was elevated.  He was advised to come to the urgent care to be evaluated.  She denies any chest pain or chest pressure.  No difficulty breathing.  No wheezing.  No nausea or vomiting.  No dizziness.  Patient has a remote history of high blood pressure. Past Medical History:  Diagnosis Date  . History of hypertension 01/20/2016  . HLD (hyperlipidemia)   . HTN (hypertension)   . Migraine   . Ureteral stone   . Vaginal Pap smear, abnormal    Remote history of 1 abnormal pap    Patient Active Problem List   Diagnosis Date Noted  . Abnormal urinalysis 12/15/2018  . Allergic rhinitis 07/30/2018  . Left foot pain 03/04/2018  . Skin exam, screening for cancer 03/04/2018  . Hypokalemia 01/25/2017  . 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 Encounter for health maintenance examination with abnormal findings 11/16/2016  . Prediabetes 01/20/2016  . Hyperlipidemia 01/20/2016    Past Surgical History:  Procedure Laterality Date  . TONSILLECTOMY  2011    OB History    Gravida  1   Para      Term      Preterm      AB      Living  1     SAB      TAB      Ectopic      Multiple      Live Births  1            Home Medications    Prior to Admission medications   Medication Sig Start Date End Date Taking? Authorizing Provider  cetirizine (ZYRTEC) 5 MG tablet Take by mouth.   Yes [provider]  fluticasone (FLONASE) 50 MCG/ACT nasal spray Place 2 sprays into both nostrils daily. 07/30/18  Yes Leone Haven, MD  Norethindrone Acetate-Ethinyl Estradiol (LOESTRIN 1.5/30, 21,) 1.5-30 MG-MCG tablet Take 1 tablet by mouth daily. 04/03/18  Yes Shambley, Melody N, CNM  pravastatin (PRAVACHOL) 40 MG tablet Take 1 tablet (40 mg total) by mouth daily. 01/26/19  Yes Leone Haven, MD  sodium chloride (OCEAN) 0.65 % SOLN nasal spray Place 1 spray into both nostrils as needed for congestion. 05/15/18   Darlin Priestly, PA-C  Calcium Carb-Cholecalciferol (CALCIUM + D3 PO) Take 1 tablet by mouth daily.  03/06/19  [provider]    Family History Family History  Problem Relation Age of Onset  . Cancer Maternal Aunt        ovarian  . Hypertension Mother   . Hypertension Father   . Kidney cancer Neg Hx   . Bladder Cancer Neg Hx   . Breast cancer Neg Hx     Social History Social History   Tobacco Use  . Smoking status: Never Smoker  . Smokeless tobacco: Never Used  Substance Use Topics  . Alcohol use: Yes    Comment: occas  . Drug use: No     Allergies  Ceftriaxone, Erythromycin, and Sulfa antibiotics   Review of Systems Review of Systems  Constitutional: Negative for activity change, fatigue and fever.  HENT: Negative.   Respiratory: Negative for cough, chest tightness and shortness of breath.   Cardiovascular: Negative.   Gastrointestinal: Negative for abdominal pain.  Endocrine: Negative for cold intolerance, heat intolerance, polydipsia, polyphagia and polyuria.  Genitourinary: Negative.   Musculoskeletal: Negative.   Skin: Negative.   Neurological: Negative.  Negative for dizziness, light-headedness, numbness and headaches.  Psychiatric/Behavioral: Negative for confusion and decreased concentration.     Physical Exam Triage Vital Signs ED Triage Vitals  Enc Vitals Group     BP 03/06/19 1519 (!) 161/96     Pulse Rate 03/06/19 1519 77     Resp 03/06/19 1519 14     Temp 03/06/19 1519 98.4 F (36.9 C)     Temp Source 03/06/19 1519 Oral     SpO2 03/06/19 1519  100 %     Weight 03/06/19 1516 175 lb (79.4 kg)     Height 03/06/19 1516 5\' 6"  (1.676 m)     Head Circumference --      Peak Flow --      Pain Score 03/06/19 1516 0     Pain Loc --      Pain Edu? --      Excl. in [place]? --    No data found.  Updated Vital Signs BP (!) 161/96 (BP Location: Right Arm)   Pulse 77   Temp 98.4 F (36.9 C) (Oral)   Resp 14   Ht 5\' 6"  (1.676 m)   Wt 79.4 kg   LMP 02/06/2019 (Approximate)   SpO2 100%   BMI 28.25 kg/m   Visual Acuity Right Eye Distance:   Left Eye Distance:   Bilateral Distance:    Right Eye Near:   Left Eye Near:    Bilateral Near:     Physical Exam Vitals and nursing note reviewed.  Constitutional:      Appearance: She is not ill-appearing or toxic-appearing.  Cardiovascular:     Rate and Rhythm: Normal rate and regular rhythm.     Pulses: Normal pulses.     Heart sounds: Normal heart sounds.  Pulmonary:     Effort: Pulmonary effort is normal.     Breath sounds: Normal breath sounds.  Abdominal:     General: Bowel sounds are normal.     Palpations: Abdomen is soft.  Musculoskeletal:        General: No swelling or signs of injury. Normal range of motion.  Skin:    Capillary Refill: Capillary refill takes less than 2 seconds.  Neurological:     General: No focal deficit present.     Mental Status: She is alert and oriented to person, place, and time.      UC Treatments / Results  Labs (all labs ordered are listed, but only abnormal results are displayed) Labs Reviewed  BASIC METABOLIC PANEL - Abnormal; Notable for the following components:      Result Value   Glucose, Bld 117 (*)    All other components within normal limits  CBC WITH DIFFERENTIAL/PLATELET  VITAMIN D 25 HYDROXY (VIT D DEFICIENCY, FRACTURES)    EKG   Radiology No results found.  Procedures Procedures (including critical care time)  Medications Ordered in UC Medications - No data to display  Initial Impression / Assessment and Plan  / UC Course  I have reviewed the triage vital signs and the nursing  notes.  Pertinent labs & imaging results that were available during my care of the patient were reviewed by me and considered in my medical decision making (see chart for details).     1.  Elevated blood pressure without diagnosis of hypertension: CBC, BMP, vitamin D level Patient will monitor her blood pressure.  She currently feels better.  If patient's symptoms worsen she is advised return to the urgent care to be reevaluated.  If the patient's require change in the current plan she will be informed about it. Final Clinical Impressions(s) / UC Diagnoses   Final diagnoses:  Elevated BP without diagnosis of hypertension   Discharge Instructions   None    ED Prescriptions    None     PDMP not reviewed this encounter.   Chase Picket, MD 03/06/19 [phone]

## 2019-03-08 ENCOUNTER — Encounter: Payer: Self-pay | Admitting: Family Medicine

## 2019-03-10 ENCOUNTER — Ambulatory Visit: Payer: Self-pay | Admitting: *Deleted

## 2019-03-10 ENCOUNTER — Encounter: Payer: Self-pay | Admitting: Family Medicine

## 2019-03-10 MED ORDER — HYDROCHLOROTHIAZIDE 12.5 MG PO TABS: 12.5000 mg | ORAL_TABLET | Freq: Every day | ORAL | 1 refills | Status: DC

## 2019-03-10 NOTE — Telephone Encounter (Signed)
If she can pass the screening it may be worthwhile having her come in to the office for an in person visit. If she can not pass the screening then virtual is fine. I did send in HCTZ for her to continue on.

## 2019-03-10 NOTE — Telephone Encounter (Signed)
See other mychart message.

## 2019-03-10 NOTE — Telephone Encounter (Signed)
I am fine with her coming in as it will be useful to check her BP and do an in person exam.

## 2019-03-10 NOTE — Telephone Encounter (Signed)
B/P elevated over the last 4 days. From 172/107. Today 123/95 and 153/100. Noticed mild lightheaded yesterday, had an HZTC  12.5 mg tab from when she was on it in the past and took it last night.  No symptoms reported today. Reviewed urgent sxs requiring immediate evaluation. Spoke with Judson Roch at PCP, virtual appointment for tomorrow at 8:30a.   Reason for Disposition . Systolic BP  >= 0000000 OR Diastolic >= 123XX123  Answer Assessment - Initial Assessment Questions 1. BLOOD PRESSURE: "What is the blood pressure?" "Did you take at least two measurements 5 minutes apart?"     123/95 2. ONSET: "When did you take your blood pressure?"    This morning 3. HOW: "How did you obtain the blood pressure?" (e.g., visiting nurse, automatic home BP monitor)     Home cuff 4. HISTORY: "Do you have a history of high blood pressure?"     yes 5. MEDICATIONS: "Are you taking any medications for blood pressure?" "Have you missed any doses recently?"     Is not currently on medication 6. OTHER SYMPTOMS: "Do you have any symptoms?" (e.g., headache, chest pain, blurred vision, difficulty breathing, weakness)     On Saturday she felt lightheaded, tired 7. PREGNANCY: "Is there any chance you are pregnant?" "When was your last menstrual period?"     no  Protocols used: HIGH BLOOD PRESSURE-A-AH

## 2019-03-11 ENCOUNTER — Encounter: Payer: Self-pay | Admitting: Family Medicine

## 2019-03-11 ENCOUNTER — Ambulatory Visit (INDEPENDENT_AMBULATORY_CARE_PROVIDER_SITE_OTHER): Payer: No Typology Code available for payment source | Admitting: Family Medicine

## 2019-03-11 ENCOUNTER — Other Ambulatory Visit: Payer: Self-pay

## 2019-03-11 VITALS — BP 140/90 | HR 65 | Temp 97.4°F | Ht 66.0 in | Wt 183.8 lb

## 2019-03-11 DIAGNOSIS — I10 Essential (primary) hypertension: Secondary | ICD-10-CM | POA: Diagnosis not present

## 2019-03-11 DIAGNOSIS — E559 Vitamin D deficiency, unspecified: Secondary | ICD-10-CM | POA: Diagnosis not present

## 2019-03-11 LAB — LIPID PANEL
Cholesterol: 175 mg/dL (ref 0–200)
HDL: 51.1 mg/dL (ref >39.00)
LDL Cholesterol: 107 mg/dL — ABNORMAL HIGH (ref 0–99)
NonHDL: 123.85
Total CHOL/HDL Ratio: 3
Triglycerides: 84 mg/dL (ref 0.0–149.0)
VLDL: 16.8 mg/dL (ref 0.0–40.0)

## 2019-03-11 LAB — HEPATIC FUNCTION PANEL
ALT: 12 U/L (ref 0–35)
AST: 16 U/L (ref 0–37)
Albumin: 4.4 g/dL (ref 3.5–5.2)
Alkaline Phosphatase: 67 U/L (ref 39–117)
Bilirubin, Direct: 0.1 mg/dL (ref 0.0–0.3)
Total Bilirubin: 0.5 mg/dL (ref 0.2–1.2)
Total Protein: 7.6 g/dL (ref 6.0–8.3)

## 2019-03-11 LAB — MICROALBUMIN / CREATININE URINE RATIO
Creatinine,U: 35.1 mg/dL
Microalb Creat Ratio: 2 mg/g (ref 0.0–30.0)
Microalb, Ur: <0.7 mg/dL (ref 0.0–1.9)

## 2019-03-11 LAB — HEMOGLOBIN A1C: Hgb A1c MFr Bld: 5.7 % (ref 4.6–6.5)

## 2019-03-11 NOTE — Assessment & Plan Note (Signed)
She will start on 5000 international units once daily of vitamin D.  Plan to recheck in 2 to 3 months.

## 2019-03-11 NOTE — Progress Notes (Signed)
Tommi Rumps, MD Phone: [phone]  Anna Kim is a 43 y.o. female who presents today for follow-up.  Hypertension: Patient notes she was at work 5 to 6 days ago when she felt a sudden flushing sensation.  She then checked her blood pressure and it was 180/100.  She went to urgent care where her blood pressure did come down some.  They did check some lab work which was reassuring though her vitamin D was low.  She started on HCTZ on Sunday.  She has had blood pressures of 130/90 the last 2 days.  No chest pain, shortness of breath, edema, numbness, or weakness.  Her parents do have hypertension.  She has been on HCTZ previously for elevated blood pressure though her blood pressures had come down into the normal range more recently.  Vitamin D deficiency: She has not been on a supplement.  She did buy 5000 international units to take daily.  Social History   Tobacco Use  Smoking Status Never Smoker  Smokeless Tobacco Never Used     ROS see history of present illness  Objective  Physical Exam Vitals:   03/11/19 0831  BP: 140/90  Pulse: 65  Temp: (!) 97.4 F (36.3 C)  SpO2: 98%    BP Readings from Last 3 Encounters:  03/11/19 140/90  03/06/19 (!) 161/96  11/04/18 130/90   Wt Readings from Last 3 Encounters:  03/11/19 183 lb 12.8 oz (83.4 kg)  03/06/19 175 lb (79.4 kg)  01/26/19 180 lb (81.6 kg)    Physical Exam Constitutional:      General: She is not in acute distress.    Appearance: She is not diaphoretic.  Cardiovascular:     Rate and Rhythm: Normal rate and regular rhythm.     Heart sounds: Normal heart sounds.  Pulmonary:     Effort: Pulmonary effort is normal.     Breath sounds: Normal breath sounds.  Abdominal:     General: Bowel sounds are normal. There is no distension.     Palpations: Abdomen is soft.     Tenderness: There is no abdominal tenderness. There is no guarding or rebound.  Musculoskeletal:     Right lower leg: No edema.     Left  lower leg: No edema.  Skin:    General: Skin is warm and dry.  Neurological:     Mental Status: She is alert.    EKG: Sinus rhythm, rate 75, 1 PAC noted, left atrial enlargement, negative precordial T waves likely normal  Assessment/Plan: Please see individual problem list.  Hypertension BP consistently elevated recently.  Discussed the need for medication.  She will continue on HCTZ.  We will check lab work.  Check an EKG.  Check urine microalbumin.  She will send [place] her readings in 1 week.  Follow-up in 1 month for recheck.  Vitamin D deficiency She will start on 5000 international units once daily of vitamin D.  Plan to recheck in 2 to 3 months.   Orders Placed This Encounter  Procedures  . Hepatic function panel  . Lipid panel  . HgB A1c  . Urine Microalbumin w/creat. ratio  . EKG 12-Lead    No orders of the defined types were placed in this encounter.   This visit occurred during the SARS-CoV-2 public health emergency.  Safety protocols were in place, including screening questions prior to the visit, additional usage of staff PPE, and extensive cleaning of exam room while observing appropriate contact time as indicated  for disinfecting solutions.    Tommi Rumps, MD [place]

## 2019-03-11 NOTE — Patient Instructions (Signed)
Nice to see you. Please remain on your hydrochlorothiazide. Please check your blood pressure daily for the next week and send [place] your readings. We will get lab work and contact you with the results.

## 2019-03-11 NOTE — Assessment & Plan Note (Signed)
BP consistently elevated recently.  Discussed the need for medication.  She will continue on HCTZ.  We will check lab work.  Check an EKG.  Check urine microalbumin.  She will send [place] her readings in 1 week.  Follow-up in 1 month for recheck.

## 2019-03-12 ENCOUNTER — Other Ambulatory Visit: Payer: No Typology Code available for payment source

## 2019-04-15 ENCOUNTER — Ambulatory Visit (INDEPENDENT_AMBULATORY_CARE_PROVIDER_SITE_OTHER): Payer: No Typology Code available for payment source | Admitting: Family Medicine

## 2019-04-15 ENCOUNTER — Other Ambulatory Visit: Payer: Self-pay

## 2019-04-15 ENCOUNTER — Encounter: Payer: Self-pay | Admitting: Family Medicine

## 2019-04-15 ENCOUNTER — Ambulatory Visit: Payer: No Typology Code available for payment source | Admitting: Family Medicine

## 2019-04-15 DIAGNOSIS — I10 Essential (primary) hypertension: Secondary | ICD-10-CM | POA: Diagnosis not present

## 2019-04-15 NOTE — Progress Notes (Signed)
Virtual Visit via video Note  This visit type was conducted due to national recommendations for restrictions regarding the COVID-19 pandemic (e.g. social distancing).  This format is felt to be most appropriate for this patient at this time.  All issues noted in this document were discussed and addressed.  No physical exam was performed (except for noted visual exam findings with Video Visits).   I connected with Anna Kim today at  8:00 AM EST by a video enabled telemedicine application or telephone and verified that I am speaking with the correct person using two identifiers. Location patient: work Location provider: work  Persons participating in the virtual visit: patient, provider  I discussed the limitations, risks, security and privacy concerns of performing an evaluation and management service by telephone and the availability of in person appointments. I also discussed with the patient that there may be a patient responsible charge related to this service. The patient expressed understanding and agreed to proceed.  Reason for visit: follow-up  HPI: HYPERTENSION  Disease Monitoring  Home BP Monitoring 120/80 Chest pain- no    Dyspnea- no Medications  Compliance-  Taking HCTZ.   Edema- no Doing well. Has not had any issues since starting the HCTZ.     ROS: See pertinent positives and negatives per HPI.  Past Medical History:  Diagnosis Date  . History of hypertension 01/20/2016  . HLD (hyperlipidemia)   . HTN (hypertension)   . Migraine   . Ureteral stone   . Vaginal Pap smear, abnormal    Remote history of 1 abnormal pap    Past Surgical History:  Procedure Laterality Date  . TONSILLECTOMY  2011    Family History  Problem Relation Age of Onset  . Cancer Maternal Aunt        ovarian  . Hypertension Mother   . Hypertension Father   . Kidney cancer Neg Hx   . Bladder Cancer Neg Hx   . Breast cancer Neg Hx     SOCIAL HX: Non-smoker   Current  Outpatient Medications:  .  cetirizine (ZYRTEC) 5 MG tablet, Take by mouth., Disp: , Rfl:  .  fluticasone (FLONASE) 50 MCG/ACT nasal spray, Place 2 sprays into both nostrils daily., Disp: 16 g, Rfl: 1 .  hydrochlorothiazide (HYDRODIURIL) 12.5 MG tablet, Take 1 tablet (12.5 mg total) by mouth daily., Disp: 90 tablet, Rfl: 1 .  Norethindrone Acetate-Ethinyl Estradiol (LOESTRIN 1.5/30, 21,) 1.5-30 MG-MCG tablet, Take 1 tablet by mouth daily., Disp: 3 Package, Rfl: 4 .  pravastatin (PRAVACHOL) 40 MG tablet, Take 1 tablet (40 mg total) by mouth daily., Disp: 90 tablet, Rfl: 3 .  sodium chloride (OCEAN) 0.65 % SOLN nasal spray, Place 1 spray into both nostrils as needed for congestion., Disp: 30 mL, Rfl: 0  EXAM:  VITALS per patient if applicable:  GENERAL: alert, oriented, appears well and in no acute distress  HEENT: atraumatic, conjunttiva clear, no obvious abnormalities on inspection of external nose and ears  NECK: normal movements of the head and neck  LUNGS: on inspection no signs of respiratory distress, breathing rate appears normal, no obvious gross SOB, gasping or wheezing  CV: no obvious cyanosis  MS: moves all visible extremities without noticeable abnormality  PSYCH/NEURO: pleasant and cooperative, no obvious depression or anxiety, speech and thought processing grossly intact  ASSESSMENT AND PLAN:  Discussed the following assessment and plan:  Hypertension Well-controlled.  She will continue hydrochlorothiazide.  She will come in for lab work early next week.  Follow-up with me in 3 to 4 months.   No orders of the defined types were placed in this encounter.   No orders of the defined types were placed in this encounter.    I discussed the assessment and treatment plan with the patient. The patient was provided an opportunity to ask questions and all were answered. The patient agreed with the plan and demonstrated an understanding of the instructions.   The patient  was advised to call back or seek an in-person evaluation if the symptoms worsen or if the condition fails to improve as anticipated.    Tommi Rumps, MD

## 2019-04-15 NOTE — Assessment & Plan Note (Signed)
Well-controlled.  She will continue hydrochlorothiazide.  She will come in for lab work early next week.  Follow-up with me in 3 to 4 months.

## 2019-04-16 ENCOUNTER — Encounter: Payer: No Typology Code available for payment source | Admitting: Obstetrics and Gynecology

## 2019-04-20 ENCOUNTER — Other Ambulatory Visit: Payer: Self-pay

## 2019-04-20 ENCOUNTER — Other Ambulatory Visit (INDEPENDENT_AMBULATORY_CARE_PROVIDER_SITE_OTHER): Payer: No Typology Code available for payment source

## 2019-04-20 DIAGNOSIS — I10 Essential (primary) hypertension: Secondary | ICD-10-CM

## 2019-04-21 ENCOUNTER — Encounter: Payer: Self-pay | Admitting: Certified Nurse Midwife

## 2019-04-21 ENCOUNTER — Other Ambulatory Visit: Payer: Self-pay | Admitting: Certified Nurse Midwife

## 2019-04-21 ENCOUNTER — Ambulatory Visit (INDEPENDENT_AMBULATORY_CARE_PROVIDER_SITE_OTHER): Payer: No Typology Code available for payment source | Admitting: Certified Nurse Midwife

## 2019-04-21 VITALS — BP 125/76 | HR 81 | Ht 66.0 in | Wt 182.2 lb

## 2019-04-21 DIAGNOSIS — Z01419 Encounter for gynecological examination (general) (routine) without abnormal findings: Secondary | ICD-10-CM | POA: Diagnosis not present

## 2019-04-21 LAB — BASIC METABOLIC PANEL
BUN: 10 mg/dL (ref 6–23)
CO2: 28 mEq/L (ref 19–32)
Calcium: 9.3 mg/dL (ref 8.4–10.5)
Chloride: 103 mEq/L (ref 96–112)
Creatinine, Ser: 0.84 mg/dL (ref 0.40–1.20)
GFR: 73.26 mL/min (ref >60.00)
Glucose, Bld: 86 mg/dL (ref 70–99)
Potassium: 3.8 mEq/L (ref 3.5–5.1)
Sodium: 138 mEq/L (ref 135–145)

## 2019-04-21 MED ORDER — NORETHINDRONE ACET-ETHINYL EST 1.5-30 MG-MCG PO TABS: 1.0000 | ORAL_TABLET | Freq: Every day | ORAL | 4 refills | Status: DC

## 2019-04-21 NOTE — Patient Instructions (Signed)

## 2019-04-21 NOTE — Progress Notes (Signed)
GYNECOLOGY ANNUAL PREVENTATIVE CARE ENCOUNTER NOTE  History:     Anna Kim is a 44 y.o. G1P0 female here for a routine annual gynecologic exam.  Current complaints: none.   Denies abnormal vaginal bleeding, discharge, pelvic pain, problems with intercourse or other gynecologic concerns.     Social History: Sexual: heterosexual Marital Status: divorced Living situation: with daughter Occupation: Therapist, music at [employer] Tobacco/alcohol: no tobacco use Illicit drugs: no history of illicit drug use  Gynecologic History No LMP recorded. (Menstrual status: Oral contraceptives). Contraception: OCP (estrogen/progesterone) Last Pap: 03/23/16. Results were: normal with negative HPV Last mammogram: 08/27/18. Results were: normal  Obstetric History OB History  Gravida Para Term Preterm AB Living  1         1  SAB TAB Ectopic Multiple Live Births          1    # Outcome Date GA Lbr Len/2nd Weight Sex Delivery Anes PTL Lv  1 Gravida 2006    F Vag-Spont   LIV    Past Medical History:  Diagnosis Date  . History of hypertension 01/20/2016  . HLD (hyperlipidemia)   . HTN (hypertension)   . Migraine   . Ureteral stone   . Vaginal Pap smear, abnormal    Remote history of 1 abnormal pap    Past Surgical History:  Procedure Laterality Date  . TONSILLECTOMY  2011    Current Outpatient Medications on File Prior to Visit  Medication Sig Dispense Refill  . cetirizine (ZYRTEC) 5 MG tablet Take by mouth.    . fluticasone (FLONASE) 50 MCG/ACT nasal spray Place 2 sprays into both nostrils daily. 16 g 1  . hydrochlorothiazide (HYDRODIURIL) 12.5 MG tablet Take 1 tablet (12.5 mg total) by mouth daily. 90 tablet 1  . Norethindrone Acetate-Ethinyl Estradiol (LOESTRIN 1.5/30, 21,) 1.5-30 MG-MCG tablet Take 1 tablet by mouth daily. 3 Package 4  . pravastatin (PRAVACHOL) 40 MG tablet Take 1 tablet (40 mg total) by mouth daily. 90 tablet 3  . sodium chloride (OCEAN) 0.65 % SOLN nasal spray  Place 1 spray into both nostrils as needed for congestion. 30 mL 0  . [DISCONTINUED] Calcium Carb-Cholecalciferol (CALCIUM + D3 PO) Take 1 tablet by mouth daily.     No current facility-administered medications on file prior to visit.    Allergies  Allergen Reactions  . Ceftriaxone Hives  . Erythromycin Other (See Comments)    GI Upset  . Sulfa Antibiotics Rash    Social History:  reports that she has never smoked. She has never used smokeless tobacco. She reports current alcohol use. She reports that she does not use drugs.  Family History  Problem Relation Age of Onset  . Cancer Maternal Aunt        ovarian  . Hypertension Mother   . Hypertension Father   . Kidney cancer Neg Hx   . Bladder Cancer Neg Hx   . Breast cancer Neg Hx     The following portions of the patient's history were reviewed and updated as appropriate: allergies, current medications, past family history, past medical history, past social history, past surgical history and problem list.  Review of Systems Pertinent items noted in HPI and remainder of comprehensive ROS otherwise negative.  Physical Exam:  BP 125/76   Pulse 81   Ht 5\' 6"  (1.676 m)   Wt 182 lb 3 oz (82.6 kg)   BMI 29.41 kg/m  CONSTITUTIONAL: Well-developed, well-nourished female in no acute distress.  HENT:  Normocephalic, atraumatic, External right and left ear normal. Oropharynx is clear and moist EYES: Conjunctivae and EOM are normal. Pupils are equal, round, and reactive to light. No scleral icterus.  NECK: Normal range of motion, supple, no masses.  Normal thyroid.  SKIN: Skin is warm and dry. No rash noted. Not diaphoretic. No erythema. No pallor. MUSCULOSKELETAL: Normal range of motion. No tenderness.  No cyanosis, clubbing, or edema.  2+ distal pulses. NEUROLOGIC: Alert and oriented to person, place, and time. Normal reflexes, muscle tone coordination.  PSYCHIATRIC: Normal mood and affect. Normal behavior. Normal judgment and  thought content. CARDIOVASCULAR: Normal heart rate noted, regular rhythm RESPIRATORY: Clear to auscultation bilaterally. Effort and breath sounds normal, no problems with respiration noted. BREASTS: Symmetric in size. No masses, tenderness, skin changes, nipple drainage, or lymphadenopathy bilaterally. ABDOMEN: Soft, no distention noted.  No tenderness, rebound or guarding.  PELVIC: Normal appearing external genitalia and urethral meatus; normal appearing vaginal mucosa and cervix.  No abnormal discharge noted.  Pap smear obtained.  Normal uterine size, no other palpable masses, no uterine or adnexal tenderness.   Assessment and Plan:    1. Women's annual routine gynecological examination  Pap smear not indicated until 2022 Mammogram scheduled Labs: none Refill BC  Routine preventative health maintenance measures emphasized. Please refer to After Visit Summary for other counseling recommendations.      Philip Aspen, CNM

## 2019-05-04 ENCOUNTER — Encounter: Payer: Self-pay | Admitting: Family Medicine

## 2019-05-04 DIAGNOSIS — Z1283 Encounter for screening for malignant neoplasm of skin: Secondary | ICD-10-CM

## 2019-07-20 ENCOUNTER — Encounter: Payer: Self-pay | Admitting: Family Medicine

## 2019-07-20 ENCOUNTER — Other Ambulatory Visit: Payer: Self-pay

## 2019-07-20 ENCOUNTER — Ambulatory Visit (INDEPENDENT_AMBULATORY_CARE_PROVIDER_SITE_OTHER): Payer: No Typology Code available for payment source | Admitting: Family Medicine

## 2019-07-20 DIAGNOSIS — I10 Essential (primary) hypertension: Secondary | ICD-10-CM | POA: Diagnosis not present

## 2019-07-20 DIAGNOSIS — R0981 Nasal congestion: Secondary | ICD-10-CM | POA: Diagnosis not present

## 2019-07-20 DIAGNOSIS — E785 Hyperlipidemia, unspecified: Secondary | ICD-10-CM

## 2019-07-20 DIAGNOSIS — J309 Allergic rhinitis, unspecified: Secondary | ICD-10-CM | POA: Diagnosis not present

## 2019-07-20 DIAGNOSIS — E663 Overweight: Secondary | ICD-10-CM | POA: Insufficient documentation

## 2019-07-20 MED ORDER — MONTELUKAST SODIUM 10 MG PO TABS: 10.0000 mg | ORAL_TABLET | Freq: Every day | ORAL | 3 refills | Status: DC

## 2019-07-20 MED ORDER — FLUTICASONE PROPIONATE 50 MCG/ACT NA SUSP: 2.0000 | Freq: Every day | NASAL | 1 refills | Status: DC

## 2019-07-20 NOTE — Patient Instructions (Signed)
Nice to see you. Try the Singulair for your allergies. Please continue to work on diet and exercise.

## 2019-07-20 NOTE — Assessment & Plan Note (Signed)
Add Singulair.  Continue Flonase and Zyrtec.  Discussed she could take these things during allergy season and then discontinue them if desired.

## 2019-07-20 NOTE — Assessment & Plan Note (Signed)
Encouraged continued exercise and dietary changes. ?

## 2019-07-20 NOTE — Assessment & Plan Note (Signed)
Continue pravastatin 

## 2019-07-20 NOTE — Progress Notes (Signed)
  Tommi Rumps, MD Phone: [phone]  Anna Kim is a 44 y.o. female who presents today for f/u.  Allergic rhinitis: Patient notes this has been going on for months.  She has sneezing and rhinorrhea particularly when she goes outside.  She has had some itchy watery eyes.  Her eye doctor recommended some eyedrops.  She does take Zyrtec and Flonase.  Hypertension: Not checking blood pressures.  Taking HCTZ.  No chest pain or shortness of breath.  Hyperlipidemia: Taking pravastatin.  No right upper quadrant pain or myalgias.  Overweight: Patient has been doing a boot camp 3 times a week since the beginning of January.  She has been working on diet as well.  More salads and vegetables.  Cut down to 1 Pepsi per day.  More water.  Less junk food.  Social History   Tobacco Use  Smoking Status Never Smoker  Smokeless Tobacco Never Used     ROS see history of present illness  Objective  Physical Exam Vitals:   07/20/19 1002  BP: 122/78  Pulse: 80  Temp: 98.2 F (36.8 C)  SpO2: 97%    BP Readings from Last 3 Encounters:  07/20/19 122/78  04/21/19 125/76  03/11/19 140/90   Wt Readings from Last 3 Encounters:  07/20/19 185 lb 12.8 oz (84.3 kg)  04/21/19 182 lb 3 oz (82.6 kg)  04/15/19 180 lb (81.6 kg)    Physical Exam Constitutional:      General: She is not in acute distress.    Appearance: She is not diaphoretic.  Eyes:     Comments: Slight eye watering noted  Cardiovascular:     Rate and Rhythm: Normal rate and regular rhythm.     Heart sounds: Normal heart sounds.  Pulmonary:     Effort: Pulmonary effort is normal.     Breath sounds: Normal breath sounds.  Musculoskeletal:     Right lower leg: No edema.     Left lower leg: No edema.  Skin:    General: Skin is warm and dry.  Neurological:     Mental Status: She is alert.      Assessment/Plan: Please see individual problem list.  Hypertension Well-controlled.  Continue HCTZ.  Continue diet and  exercise.  Allergic rhinitis Add Singulair.  Continue Flonase and Zyrtec.  Discussed she could take these things during allergy season and then discontinue them if desired.  Hyperlipidemia Continue pravastatin.  Overweight Encouraged continued exercise and dietary changes.   No orders of the defined types were placed in this encounter.   Meds ordered this encounter  Medications  . montelukast (SINGULAIR) 10 MG tablet    Sig: Take 1 tablet (10 mg total) by mouth at bedtime.    Dispense:  30 tablet    Refill:  3  . fluticasone (FLONASE) 50 MCG/ACT nasal spray    Sig: Place 2 sprays into both nostrils daily.    Dispense:  16 g    Refill:  1    This visit occurred during the SARS-CoV-2 public health emergency.  Safety protocols were in place, including screening questions prior to the visit, additional usage of staff PPE, and extensive cleaning of exam room while observing appropriate contact time as indicated for disinfecting solutions.    Tommi Rumps, MD [place]

## 2019-07-20 NOTE — Assessment & Plan Note (Signed)
Well-controlled.  Continue HCTZ.  Continue diet and exercise.

## 2019-08-28 ENCOUNTER — Ambulatory Visit
Admission: RE | Admit: 2019-08-28 | Discharge: 2019-08-28 | Disposition: A | Discharge status: Home / Self Care | Payer: No Typology Code available for payment source | Source: Ambulatory Visit | Attending: Certified Nurse Midwife | Admitting: Certified Nurse Midwife

## 2019-08-28 DIAGNOSIS — Z1231 Encounter for screening mammogram for malignant neoplasm of breast: Secondary | ICD-10-CM | POA: Insufficient documentation

## 2019-08-28 DIAGNOSIS — Z01419 Encounter for gynecological examination (general) (routine) without abnormal findings: Secondary | ICD-10-CM

## 2019-09-01 ENCOUNTER — Other Ambulatory Visit: Payer: Self-pay | Admitting: Family Medicine

## 2019-12-15 ENCOUNTER — Other Ambulatory Visit: Payer: Self-pay

## 2019-12-15 ENCOUNTER — Ambulatory Visit (INDEPENDENT_AMBULATORY_CARE_PROVIDER_SITE_OTHER): Payer: No Typology Code available for payment source | Admitting: Nurse Practitioner

## 2019-12-15 ENCOUNTER — Ambulatory Visit
Admission: RE | Admit: 2019-12-15 | Discharge: 2019-12-15 | Disposition: A | Discharge status: Home / Self Care | Payer: No Typology Code available for payment source | Attending: Nurse Practitioner | Admitting: Nurse Practitioner

## 2019-12-15 ENCOUNTER — Ambulatory Visit
Admission: RE | Admit: 2019-12-15 | Discharge: 2019-12-15 | Disposition: A | Discharge status: Home / Self Care | Payer: No Typology Code available for payment source | Source: Ambulatory Visit | Attending: Nurse Practitioner | Admitting: Nurse Practitioner

## 2019-12-15 ENCOUNTER — Encounter: Payer: Self-pay | Admitting: Nurse Practitioner

## 2019-12-15 VITALS — BP 118/82 | HR 84 | Temp 98.7°F | Ht 66.0 in | Wt 174.0 lb

## 2019-12-15 DIAGNOSIS — M542 Cervicalgia: Secondary | ICD-10-CM | POA: Insufficient documentation

## 2019-12-15 DIAGNOSIS — M47812 Spondylosis without myelopathy or radiculopathy, cervical region: Secondary | ICD-10-CM

## 2019-12-15 MED ORDER — METHOCARBAMOL 500 MG PO TABS: 500.0000 mg | ORAL_TABLET | Freq: Three times a day (TID) | ORAL | 0 refills | Status: AC | PRN

## 2019-12-15 NOTE — Patient Instructions (Addendum)
Please go to [facility] for your neck x-ray.  For neck pain, try Tylenol Arthritis and follow package directions.  This is a long-acting Tylenol medication.  You may take Aleve 1/day for up to 7 days- but monitor blood pressure closely.  If blood pressure increases, discontinue.  I have also ordered a muscle relaxer Robaxin 500 mg to take up to 3 times a day, but need to monitor for drowsiness.  Take only as needed.  Conservative therapy with muscle relaxer, Tylenol, short course of nonsteroidal anti-inflammatory Aleve, heat as appropriate.  Some people find alternating heat and ice helpful.  Discontinue boot camp exercises that may aggravate the upper neck and shoulders.    Further recommendation pending x-ray results.  Please see Dr. Caryl Bis in follow-up over the next 2 weeks.  Call back if symptoms worsen in the interim.   Musculoskeletal Pain Musculoskeletal pain refers to aches and pains in your bones, joints, muscles, and the tissues that surround them. This pain can occur in any part of the body. It can last for a short time (acute) or a long time (chronic). A physical exam, lab tests, and imaging studies may be done to find the cause of your musculoskeletal pain. Follow these instructions at home:  Lifestyle  Try to control or lower your stress levels. Stress increases muscle tension and can worsen musculoskeletal pain. It is important to recognize when you are anxious or stressed and learn ways to manage it. This may include: ? Meditation or yoga. ? Cognitive or behavioral therapy. ? Acupuncture or massage therapy.  You may continue all activities unless the activities cause more pain. When the pain gets better, slowly resume your normal activities. Gradually increase the intensity and duration of your activities or exercise. Managing pain, stiffness, and swelling  Take over-the-counter and prescription medicines only as told by your health care provider.  When your pain is  severe, bed rest may be helpful. Lie or sit in any position that is comfortable, but get out of bed and walk around at least every couple of hours.  If directed, apply heat to the affected area as often as told by your health care provider. Use the heat source that your health care provider recommends, such as a moist heat pack or a heating pad. ? Place a towel between your skin and the heat source. ? Leave the heat on for 20-30 minutes. ? Remove the heat if your skin turns bright red. This is especially important if you are unable to feel pain, heat, or cold. You may have a greater risk of getting burned.  If directed, put ice on the painful area. ? Put ice in a plastic bag. ? Place a towel between your skin and the bag. ? Leave the ice on for 20 minutes, 2-3 times a day. General instructions  Your health care provider may recommend that you see a physical therapist. This person can help you come up with a safe exercise program. Do any exercises as told by your physical therapist.  Keep all follow-up visits, including any physical therapy visits, as told by your health care providers. This is important. Contact a health care provider if:  Your pain gets worse.  Medicines do not help ease your pain.  You cannot use the part of your body that hurts, such as your arm, leg, or neck.  You have trouble sleeping.  You have trouble doing your normal activities. Get help right away if:  You have a new injury  and your pain is worse or different.  You feel numb or you have tingling in the painful area. Summary  Musculoskeletal pain refers to aches and pains in your bones, joints, muscles, and the tissues that surround them.  This pain can occur in any part of the body.  Your health care provider may recommend that you see a physical therapist. This person can help you come up with a safe exercise program. Do any exercises as told by your physical therapist.  Lower your stress level.  Stress can worsen musculoskeletal pain. Ways to lower stress may include meditation, yoga, cognitive or behavioral therapy, acupuncture, and massage therapy. This information is not intended to replace advice given to you by your health care provider. Make sure you discuss any questions you have with your health care provider. Document Revised: 02/22/2017 Document Reviewed: 04/11/2016 Elsevier Patient Education  2020 [employer].

## 2019-12-15 NOTE — Progress Notes (Signed)
Established Patient Office Visit  Subjective:  Patient ID: Anna Kim, female    DOB: 03-08-1976  Age: 44 y.o. MRN: [id number]  CC:  Chief Complaint  Patient presents with  . Acute Visit    Neck pain    HPI Anna Kim is a 44 yo who presents for 1 month history of neck discomfort.  She initially thought maybe she slept wrong but it has been intermittent.  When she flexes her neck or extends it she will get some discomfort in the neck to the right shoulder.Sometimes, it is a 5 out of 10 pain scale. She has had a few episodes of tingling in the right fingers- fleeting. No weakness or sensation changes. 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  She has had no specific injury or trauma to the neck or shoulder.  She has taken occasional Aleve which helps, but she finds it increases her blood pressure so she does not take it often.  She has worked out in [employer] regularly and has had no unusual injury there.  She had to cut back on some of the weight lifting on the upper extremity.  She would be interested in x-rays of the neck.   Past Medical History:  Diagnosis Date  . History of hypertension 01/20/2016  . HLD (hyperlipidemia)   . HTN (hypertension)   . Migraine   . Ureteral stone   . Vaginal Pap smear, abnormal    Remote history of 1 abnormal pap    Past Surgical History:  Procedure Laterality Date  . TONSILLECTOMY  2011    Family History  Problem Relation Age of Onset  . Cancer Maternal Aunt        ovarian  . Hypertension Mother   . Hypertension Father   . Kidney cancer Neg Hx   . Bladder Cancer Neg Hx   . Breast cancer Neg Hx     Social History   Socioeconomic History  . Marital status: Divorced    Spouse name: Not on file  . Number of children: Not on file  . Years of education: Not on file  . Highest education level: Not on file  Occupational History  . Not on file  Tobacco Use  . Smoking status: Never  Smoker  . Smokeless tobacco: Never Used  Vaping Use  . Vaping Use: Never used  Substance and Sexual Activity  . Alcohol use: Yes    Comment: occas  . Drug use: No  . Sexual activity: Not Currently    Birth control/protection: Pill  Other Topics Concern  . Not on file  Social History Narrative  . Not on file   Social Determinants of Health   Financial Resource Strain:   . Difficulty of Paying Living Expenses: Not on file  Food Insecurity:   . Worried About Charity fundraiser in the Last Year: Not on file  . Ran Out of Food in the Last Year: Not on file  Transportation Needs:   . Lack of Transportation (Medical): Not on file  . Lack of Transportation (Non-Medical): Not on file  Physical Activity:   . Days of Exercise per Week: Not on file  . Minutes of Exercise per Session: Not on file  Stress:   . Feeling of Stress : Not on file  Social Connections:   . Frequency of Communication with Friends and Family: Not on file  . Frequency of Social Gatherings  with Friends and Family: Not on file  . Attends Religious Services: Not on file  . Active Member of Clubs or Organizations: Not on file  . Attends Archivist Meetings: Not on file  . Marital Status: Not on file  Intimate Partner Violence:   . Fear of Current or Ex-Partner: Not on file  . Emotionally Abused: Not on file  . Physically Abused: Not on file  . Sexually Abused: Not on file    Outpatient Medications Prior to Visit  Medication Sig Dispense Refill  . cetirizine (ZYRTEC) 5 MG tablet Take by mouth.    . Cholecalciferol (VITAMIN D) 125 MCG (5000 UT) CAPS Take by mouth.    . fluticasone (FLONASE) 50 MCG/ACT nasal spray Place 2 sprays into both nostrils daily. 16 g 1  . hydrochlorothiazide (MICROZIDE) 12.5 MG capsule TAKE 1 CAPSULE BY MOUTH ONCE DAILY 90 capsule 1  . Norethindrone Acetate-Ethinyl Estradiol (LOESTRIN 1.5/30, 21,) 1.5-30 MG-MCG tablet Take 1 tablet by mouth daily. 3 Package 4  . pravastatin  (PRAVACHOL) 40 MG tablet Take 1 tablet (40 mg total) by mouth daily. 90 tablet 3  . montelukast (SINGULAIR) 10 MG tablet Take 1 tablet (10 mg total) by mouth at bedtime. 30 tablet 3   No facility-administered medications prior to visit.    Allergies  Allergen Reactions  . Ceftriaxone Hives  . Erythromycin Other (See Comments)    GI Upset  . Sulfa Antibiotics Rash    Review of Systems Pertinent positives none history of present illness otherwise negative.   Objective:    Physical Exam Vitals reviewed.  Constitutional:      Appearance: Normal appearance.  Eyes:     Conjunctiva/sclera: Conjunctivae normal.     Pupils: Pupils are equal, round, and reactive to light.  Neck:     Thyroid: No thyromegaly.  Cardiovascular:     Rate and Rhythm: Normal rate and regular rhythm.     Pulses: Normal pulses.     Heart sounds: Normal heart sounds.  Pulmonary:     Effort: Pulmonary effort is normal.     Breath sounds: Normal breath sounds.  Musculoskeletal:        General: Normal range of motion.     Right shoulder: Normal. No swelling, deformity, tenderness or bony tenderness. Normal range of motion. Normal strength. Normal pulse.     Left shoulder: Normal. No tenderness or bony tenderness. Normal range of motion. Normal strength. Normal pulse.     Cervical back: Normal range of motion and neck supple. No rigidity or tenderness. No pain with movement, spinous process tenderness or muscular tenderness. Normal range of motion.  Lymphadenopathy:     Cervical: No cervical adenopathy.  Neurological:     Mental Status: She is alert.     BP 118/82 (BP Location: Left Arm, Patient Position: Sitting, Cuff Size: Normal)   Pulse 84   Temp 98.7 F (37.1 C) (Oral)   Ht 5\' 6"  (1.676 m)   Wt 174 lb (78.9 kg)   SpO2 97%   BMI 28.08 kg/m  Wt Readings from Last 3 Encounters:  12/15/19 174 lb (78.9 kg)  07/20/19 185 lb 12.8 oz (84.3 kg)  04/21/19 182 lb 3 oz (82.6 kg)   Pulse Readings from  Last 3 Encounters:  12/15/19 84  07/20/19 80  04/21/19 81    BP Readings from Last 3 Encounters:  12/15/19 118/82  07/20/19 122/78  04/21/19 125/76    Lab Results  Component Value Date  CHOL 175 03/11/2019   HDL 51.10 03/11/2019   LDLCALC 107 (H) 03/11/2019   LDLDIRECT 100.0 06/07/2017   TRIG 84.0 03/11/2019   CHOLHDL 3 03/11/2019      Health Maintenance Due  Topic Date Due  . Hepatitis C Screening  Never done    There are no preventive care reminders to display for this patient.  Lab Results  Component Value Date   TSH 0.84 03/03/2018   Lab Results  Component Value Date   WBC 6.3 03/06/2019   HGB 12.2 03/06/2019   HCT 36.0 03/06/2019   MCV 91.8 03/06/2019   PLT 202 03/06/2019   Lab Results  Component Value Date   NA 138 04/20/2019   K 3.8 04/20/2019   CO2 28 04/20/2019   GLUCOSE 86 04/20/2019   BUN 10 04/20/2019   CREATININE 0.84 04/20/2019   BILITOT 0.5 03/11/2019   ALKPHOS 67 03/11/2019   AST 16 03/11/2019   ALT 12 03/11/2019   PROT 7.6 03/11/2019   ALBUMIN 4.4 03/11/2019   CALCIUM 9.3 04/20/2019   ANIONGAP 7 03/06/2019   GFR 73.26 04/20/2019   Lab Results  Component Value Date   CHOL 175 03/11/2019   Lab Results  Component Value Date   HDL 51.10 03/11/2019   Lab Results  Component Value Date   LDLCALC 107 (H) 03/11/2019   Lab Results  Component Value Date   TRIG 84.0 03/11/2019   Lab Results  Component Value Date   CHOLHDL 3 03/11/2019   Lab Results  Component Value Date   HGBA1C 5.7 03/11/2019      Assessment & Plan:   Problem List Items Addressed This Visit      Other   Neck pain - Primary   Relevant Orders   DG Cervical Spine Complete (Completed)      Meds ordered this encounter  Medications  . methocarbamol (ROBAXIN) 500 MG tablet    Sig: Take 1 tablet (500 mg total) by mouth every 8 (eight) hours as needed for up to 7 days for muscle spasms.    Dispense:  21 tablet    Refill:  0    Order Specific Question:    Supervising Provider    Answer:   Einar Pheasant [[id number]]   Please go to [facility] for your neck x-ray.  For neck pain, try Tylenol Arthritis and follow package directions.  This is a long-acting Tylenol medication.  You may take Aleve 1/day for up to 7 days- but monitor blood pressure closely.  If blood pressure increases, discontinue.  I have also ordered a muscle relaxer Robaxin 500 mg to take up to 3 times a day, but need to monitor for drowsiness.  Take only as needed.  Conservative therapy with muscle relaxer, Tylenol, short course of nonsteroidal anti-inflammatory Aleve, heat as appropriate.  Some people find alternating heat and ice helpful.  Discontinue boot camp exercises that may aggravate the upper neck and shoulders.    Further recommendation pending x-ray results.  Please see Dr. Caryl Bis in follow-up over the next 2 weeks.  Call back if symptoms worsen in the interim.   Follow-up: Return in about 2 weeks (around 12/29/2019).   This visit occurred during the SARS-CoV-2 public health emergency.  Safety protocols were in place, including screening questions prior to the visit, additional usage of staff PPE, and extensive cleaning of exam room while observing appropriate contact time as indicated for disinfecting solutions.   Denice Paradise, NP

## 2019-12-16 ENCOUNTER — Encounter: Payer: Self-pay | Admitting: Nurse Practitioner

## 2019-12-17 NOTE — Addendum Note (Signed)
Addended by: Denice Paradise A on: 12/17/2019 04:17 PM   Modules accepted: Orders

## 2019-12-23 ENCOUNTER — Other Ambulatory Visit: Payer: Self-pay | Admitting: Nurse Practitioner

## 2019-12-23 DIAGNOSIS — M4802 Spinal stenosis, cervical region: Secondary | ICD-10-CM

## 2019-12-23 DIAGNOSIS — M5412 Radiculopathy, cervical region: Secondary | ICD-10-CM

## 2020-01-01 ENCOUNTER — Other Ambulatory Visit: Payer: Self-pay | Admitting: Nurse Practitioner

## 2020-01-08 ENCOUNTER — Other Ambulatory Visit: Payer: Self-pay

## 2020-01-08 ENCOUNTER — Ambulatory Visit
Admission: RE | Admit: 2020-01-08 | Discharge: 2020-01-08 | Disposition: A | Discharge status: Home / Self Care | Payer: No Typology Code available for payment source | Source: Ambulatory Visit | Attending: Nurse Practitioner | Admitting: Nurse Practitioner

## 2020-01-08 DIAGNOSIS — M5412 Radiculopathy, cervical region: Secondary | ICD-10-CM | POA: Insufficient documentation

## 2020-01-08 DIAGNOSIS — M4802 Spinal stenosis, cervical region: Secondary | ICD-10-CM | POA: Insufficient documentation

## 2020-01-27 ENCOUNTER — Ambulatory Visit: Payer: No Typology Code available for payment source | Attending: Family Medicine

## 2020-01-27 ENCOUNTER — Other Ambulatory Visit: Payer: Self-pay

## 2020-01-27 DIAGNOSIS — R252 Cramp and spasm: Secondary | ICD-10-CM | POA: Diagnosis present

## 2020-01-27 DIAGNOSIS — M542 Cervicalgia: Secondary | ICD-10-CM | POA: Insufficient documentation

## 2020-01-27 NOTE — Therapy (Signed)
[facility] [address], [place], [address] Phone: [phone]   Fax:  [phone]  Physical Therapy Evaluation  Patient Details  Name: Anna Kim MRN: [id number] Date of Birth: 1976-03-20 Referring Provider (PT): Meeler, Whitney   Encounter Date: 01/27/2020   PT End of Session - 01/27/20 1234    Visit Number 1    Number of Visits 7    Date for PT Re-Evaluation 03/09/20    PT Start Time 2878    PT Stop Time 1145    PT Time Calculation (min) 60 min    Activity Tolerance Patient tolerated treatment well    Behavior During Therapy [facility] for tasks assessed/performed           Past Medical History:  Diagnosis Date  . History of hypertension 01/20/2016  . HLD (hyperlipidemia)   . HTN (hypertension)   . Migraine   . Ureteral stone   . Vaginal Pap smear, abnormal    Remote history of 1 abnormal pap    Past Surgical History:  Procedure Laterality Date  . TONSILLECTOMY  2011    There were no vitals filed for this visit.    Subjective Assessment - 01/27/20 1227    Subjective Patient is a 44 year old female presenting with right-sided neck pain pain from an insidious onset starting 4 months prior when waking up one morning patient with increased pain with lifting arm up to the side and any general activation of the upper trap on the right side.  Patient works out 3 days a week and actively avoids activities that involve activation of the right upper trap such as upright rows lateral raises and presses.  Patient specifically mentions increased pain and difficulty with performing bent over row.  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  Patient states she would like to return to work with less difficulty and decreased pain overall.  Patient reports decreased pain after using pain  medication.  Patient states intermittent radiation of pain running down into the palmar surface of the right hand.  However patient reports this is very infrequent.    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    Limitations Lifting    Diagnostic tests MRI: Negative for any significant finding    Patient Stated Goals Patient would like to return to work without having any pain.    Currently in Pain? No/denies    Pain Score --   Worst pain 7 out of 10; best pain 0 out of 10   Pain Location Neck    Pain Orientation Right    Pain Descriptors / Indicators Aching    Pain Type Acute pain    Pain Onset More than a month ago    Pain Frequency Intermittent              [facility] PT Assessment - 01/27/20 1250      Assessment   Medical Diagnosis Neck pain    Referring Provider (PT) Meeler, Whitney    Onset Date/Surgical Date 10/25/19    Hand Dominance Right    Next MD Visit Unknown    Prior Therapy Yes for pelvic floor and elbow pain      Balance Screen   Has the patient fallen in the  past 6 months No    Has the patient had a decrease in activity level because of a fear of falling?  Yes    Is the patient reluctant to leave their home because of a fear of falling?  No      Home Environment   Living Environment Private residence      Prior Function   Level of Independence Independent with basic ADLs    Vocation Full time employment    Vocation Requirements Lifting, wearing heavy vest, walking.    Leisure Librarian, academic and recreational activities with daughter.      Cognition   Overall Cognitive Status Within Functional Limits for tasks assessed      Observation/Other Assessments   Focus on Therapeutic Outcomes (FOTO)  68      Sensation   Light Touch Appears Intact      Posture/Postural Control   Posture/Postural Control No significant limitations      ROM / Strength     AROM / PROM / Strength AROM;Strength      AROM   Overall AROM Comments Shoulder and cervical AROM within Normal limits; Increased pain with performing lateral side bending to the R and L; Increased pain with R sided       Strength   Overall Strength Comments L Shoulder MMT: WNL;     Strength Assessment Site Shoulder;Cervical    Right/Left Shoulder Right;Left    Right Shoulder Flexion 5/5    Right Shoulder ABduction 5/5   painful   Right Shoulder Internal Rotation 4+/5    Right Shoulder External Rotation 4+/5    Cervical Flexion 5/5    Cervical Extension 5/5    Cervical - Right Side Bend 5/5   Increased pain   Cervical - Left Side Bend 5/5    Cervical - Right Rotation 5/5   pain   Cervical - Left Rotation 5/5      Palpation   Spinal mobility Cervical spine levels C3 -  C5 are hypomobile and painful.    Palpation comment Tenderness to palpation along upper trap on the right side and along cervical extensors on the right side.      Special Tests    Special Tests Cervical    Cervical Tests Spurling's;Dictraction      Spurling's   Findings Negative    Comment B      Distraction Test   Findngs Negative    Comment B            Objective measurements completed on examination: See above findings.    TREATMENT  Therapeutic exercise Scapular retraction with green Thera-Band-10 X2 Standing shrugs with green band-10 X2 Sitting upper trap stretch to the right-30 seconds All exercises added to HEP  Exercises performed to decrease increased spasms and pain as well as improve muscular strength  Dry needling: 0.25 X 40 mm-dry needle placed along the upper trap on the right side with patient positioned in supine.  Goal of treatment to decrease pain along the upper trap muscle.  Patient verbalizes consent to treatment.  Performed for 5 minutes- unbilled           PT Education - 01/27/20 1234    Education Details Discussed plan of care with patient; form and technique  with exercises.    Person(s) Educated Patient    Methods Explanation;Demonstration;Handout    Comprehension Returned demonstration;Verbalized understanding            PT Short Term Goals - 01/27/20 1241  PT SHORT TERM GOAL #1   Title Patient will be independent with home exercise program to continue benefits of therapy after discharge.    Baseline Dependent with form and technique.    Time 2    Period Weeks    Status New             PT Long Term Goals - 01/27/20 1241      PT LONG TERM GOAL #1   Title Patient will be independent with home exercise program to continue benefits of therapy after discharge.    Baseline Dependent with form and technique.    Time 6    Period Weeks    Status New    Target Date 03/10/20      PT LONG TERM GOAL #2   Title Patient will improve her focus on outcome measure score from a 68 to a 78 indicates significant improvement in cervical dysfunction and return to occupational activities.    Baseline 68    Time 6    Period Weeks    Status New      PT LONG TERM GOAL #3   Title Patient will have a worst pain score of 2 out of 10 to indicate significant improvement in pain analog score.    Baseline 7/10    Time 6    Period Weeks    Status New      PT LONG TERM GOAL #4   Title Patient will return to workout activities with no increase in pain to indicate return to recreational tasks.    Baseline Increased pain with performance    Time 6    Period Weeks    Status New                  Plan - 01/27/20 1235    Clinical Impression Statement Patient is a 44 year old right hand dominant female presenting with right-sided neck pain.  Patient demonstrates cervical dysfunction as indicated by a decreased FOTO score, increased pain with performing lifting activities, and tenderness to palpation upon examination.  Patient also demonstrates limitations in right shoulder and neck strength along the upper trapezius, external rotator, and  internal rotator musculature.  Performed dry needling during today's session patient with alleviation of symptoms after performance indicating improvement in motor coordination and activation of upper trapezius muscles.  Patient will benefit from further skilled therapy focused on improving these impairments to return to previous level of function.    Personal Factors and Comorbidities Profession    Comorbidities Xray tech    Examination-Activity Limitations Carry    Examination-Participation Restrictions Occupation    Stability/Clinical Decision Making Stable/Uncomplicated    Clinical Decision Making Low    Rehab Potential Good    PT Frequency 1x / week    PT Duration 6 weeks    PT Treatment/Interventions Cryotherapy;Moist Heat;Iontophoresis 4mg /ml Dexamethasone;Electrical Stimulation;Neuromuscular re-education;Therapeutic exercise;Manual techniques;Dry needling;Joint Manipulations;Spinal Manipulations    PT Next Visit Plan Progress exercises.    PT Home Exercise Plan HEP includes upper trap stretch, scapular retraction, banded shrugs.    Consulted and Agree with Plan of Care Patient           Patient will benefit from skilled therapeutic intervention in order to improve the following deficits and impairments:  Pain, Decreased coordination, Increased muscle spasms, Decreased activity tolerance, Decreased endurance, Decreased range of motion, Decreased strength  Visit Diagnosis: Cervicalgia  Cramp and spasm     Problem List Patient Active Problem List   Diagnosis Date Noted  .  Neck pain 12/15/2019  . Overweight 07/20/2019  . Hypertension 03/11/2019  . Vitamin D deficiency 03/11/2019  . Allergic rhinitis 07/30/2018  . Left foot pain 03/04/2018  . Skin exam, screening for cancer 03/04/2018  . 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 Prediabetes 01/20/2016  . Hyperlipidemia 01/20/2016    Blythe Stanford, PT DPT 01/27/2020, 1:07 PM  [facility] [address] [place], [address] Phone: [phone]   Fax:  [phone]  Name: Anna Kim MRN: [id number] Date of Birth: 04/28/1975

## 2020-01-29 ENCOUNTER — Ambulatory Visit (INDEPENDENT_AMBULATORY_CARE_PROVIDER_SITE_OTHER): Payer: No Typology Code available for payment source | Admitting: Family Medicine

## 2020-01-29 ENCOUNTER — Other Ambulatory Visit: Payer: Self-pay

## 2020-01-29 ENCOUNTER — Encounter: Payer: Self-pay | Admitting: Family Medicine

## 2020-01-29 VITALS — BP 120/80 | HR 78 | Temp 98.7°F | Ht 66.0 in | Wt 171.8 lb

## 2020-01-29 DIAGNOSIS — M542 Cervicalgia: Secondary | ICD-10-CM | POA: Diagnosis not present

## 2020-01-29 DIAGNOSIS — E785 Hyperlipidemia, unspecified: Secondary | ICD-10-CM | POA: Diagnosis not present

## 2020-01-29 DIAGNOSIS — I10 Essential (primary) hypertension: Secondary | ICD-10-CM

## 2020-01-29 DIAGNOSIS — R7303 Prediabetes: Secondary | ICD-10-CM | POA: Diagnosis not present

## 2020-01-29 NOTE — Progress Notes (Signed)
Tommi Rumps, MD Phone: [phone]  Anna Kim is a 44 y.o. female who presents today for f/u.  HYPERTENSION  Disease Monitoring  Home BP Monitoring not checking though reports it is 120/80 at other offices  Chest pain- no    Dyspnea- no Medications  Compliance-  Taking HCTZ.   Edema- no  HYPERLIPIDEMIA Symptoms Chest pain on exertion:  no   Medications: Compliance- taking pravastatin Right upper quadrant pain- no  Muscle aches- no  Chronic neck pain: This has been an ongoing issue.  She had an MRI that did not reveal any significant cause for her symptoms.  She saw neurosurgery and they referred for physical therapy.  Physical therapy thinks it is a trapezius muscle issue.  They did dry needling and patient notes soreness from that.  She is going once weekly for 4 weeks to see the physical therapist.  She rarely uses the muscle relaxer.  No drowsiness with that.  Social History   Tobacco Use  Smoking Status Never Smoker  Smokeless Tobacco Never Used     ROS see history of present illness  Objective  Physical Exam Vitals:   01/29/20 0815  BP: 120/80  Pulse: 78  Temp: 98.7 F (37.1 C)  SpO2: 98%    BP Readings from Last 3 Encounters:  01/29/20 120/80  12/15/19 118/82  07/20/19 122/78   Wt Readings from Last 3 Encounters:  01/29/20 171 lb 12.8 oz (77.9 kg)  12/15/19 174 lb (78.9 kg)  07/20/19 185 lb 12.8 oz (84.3 kg)    Physical Exam Constitutional:      General: She is not in acute distress.    Appearance: She is not diaphoretic.  Cardiovascular:     Rate and Rhythm: Normal rate and regular rhythm.     Heart sounds: Normal heart sounds.  Pulmonary:     Effort: Pulmonary effort is normal.     Breath sounds: Normal breath sounds.  Musculoskeletal:     Comments: No midline neck tenderness, no midline neck step-off, slight tenderness of the right trapezius muscle throughout  Skin:    General: Skin is warm and dry.  Neurological:     Mental  Status: She is alert.      Assessment/Plan: Please see individual problem list.  Problem List Items Addressed This Visit    Hyperlipidemia    Plan for labs in December.  Continue pravastatin 40 mg once daily.      Relevant Orders   Lipid panel   Hypertension - Primary    Adequate control.  Plan for labs in December.  Continue HCTZ 12.5 mg once daily.      Relevant Orders   Comp Met (CMET)   Neck pain    Chronic issue.  She will complete physical therapy.  She can continue the muscle relaxer as needed as prescribed.  Discussed monitoring for drowsiness.  Discussed seeking medical attention if she develops numbness or weakness.  Discussed given her relatively benign MRI she would be unlikely to have any neurological issues related to her neck pain.      Prediabetes    Check A1c with labs in December.      Relevant Orders   HgB A1c      This visit occurred during the SARS-CoV-2 public health emergency.  Safety protocols were in place, including screening questions prior to the visit, additional usage of staff PPE, and extensive cleaning of exam room while observing appropriate contact time as indicated for disinfecting solutions.  Tommi Rumps, MD [place]

## 2020-01-29 NOTE — Assessment & Plan Note (Signed)
Chronic issue.  She will complete physical therapy.  She can continue the muscle relaxer as needed as prescribed.  Discussed monitoring for drowsiness.  Discussed seeking medical attention if she develops numbness or weakness.  Discussed given her relatively benign MRI she would be unlikely to have any neurological issues related to her neck pain.

## 2020-01-29 NOTE — Patient Instructions (Signed)
Nice to see you. Please get your labs in the second half of December. Please monitor your neck pain if it is not improving with physical therapy please let us know.

## 2020-01-29 NOTE — Assessment & Plan Note (Signed)
Adequate control.  Plan for labs in December.  Continue HCTZ 12.5 mg once daily.

## 2020-01-29 NOTE — Assessment & Plan Note (Signed)
Check A1c with labs in December.

## 2020-01-29 NOTE — Assessment & Plan Note (Signed)
Plan for labs in December.  Continue pravastatin 40 mg once daily.

## 2020-02-01 ENCOUNTER — Ambulatory Visit: Payer: No Typology Code available for payment source

## 2020-02-01 ENCOUNTER — Other Ambulatory Visit: Payer: Self-pay

## 2020-02-01 DIAGNOSIS — M542 Cervicalgia: Secondary | ICD-10-CM

## 2020-02-01 DIAGNOSIS — R252 Cramp and spasm: Secondary | ICD-10-CM

## 2020-02-01 NOTE — Therapy (Signed)
[facility] [address] [place], [address] Phone: [phone]   Fax:  [phone]  Physical Therapy Treatment  Patient Details  Name: Anna Kim MRN: [id number] Date of Birth: [birth year] Referring Provider (PT): Meeler, Whitney   Encounter Date: 02/01/2020   PT End of Session - 02/01/20 1031    Visit Number 2    Number of Visits 7    Date for PT Re-Evaluation 03/09/20    PT Start Time 1030    PT Stop Time 1115    PT Time Calculation (min) 45 min    Activity Tolerance Patient tolerated treatment well    Behavior During Therapy [facility] for tasks assessed/performed           Past Medical History:  Diagnosis Date  . History of hypertension 01/20/2016  . HLD (hyperlipidemia)   . HTN (hypertension)   . Migraine   . Ureteral stone   . Vaginal Pap smear, abnormal    Remote history of 1 abnormal pap    Past Surgical History:  Procedure Laterality Date  . TONSILLECTOMY  2011    There were no vitals filed for this visit.   Subjective Assessment - 02/01/20 1030    Subjective Patient reports soreness after DN; however is felt better after the soreness went away.  Patient states that the pain remains.    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    Limitations Lifting    Diagnostic tests MRI: Negative for any significant finding    Patient Stated Goals Patient would like to return to work without having any pain.    Currently in Pain? Yes    Pain Score 3     Pain Location Neck    Pain Onset More than a month ago           TREATMENT   Therapeutic exercise Seated Rows at [place]  2x10 20#  Standing Shrugs at [place] 25# Sitting upper trap stretch to the right-30 seconds Standing Shoulder Press 2x12 8#  Standing Row 2x12 35#  Standing Upright Row GTB 2x15  KB Swing 2x15 20#   Straight Arm Pull Apart 2x15 GTB    Exercises performed to decrease increased spasms and pain as well as improve muscular strength   Dry needling: 0.25 X 40 mm-dry needle placed along the upper trap on the right side with patient positioned in supine.  Goal of treatment to decrease pain along the upper trap muscle.  Patient verbalizes consent to treatment.         PT Education - 02/01/20 1031    Education Details Form/Technique with Exercise    Person(s) Educated Patient    Methods Explanation;Demonstration    Comprehension Verbalized understanding;Returned demonstration            PT Short Term Goals - 01/27/20 1241      PT SHORT TERM GOAL #1   Title Patient will be independent with home exercise program to continue benefits of therapy after discharge.    Baseline Dependent with form and technique.    Time 2    Period Weeks    Status New             PT Long Term Goals - 01/27/20 1241      PT LONG TERM GOAL #1   Title Patient will be independent with home exercise program to continue  benefits of therapy after discharge.    Baseline Dependent with form and technique.    Time 6    Period Weeks    Status New    Target Date 03/10/20      PT LONG TERM GOAL #2   Title Patient will improve her focus on outcome measure score from a 68 to a 78 indicates significant improvement in cervical dysfunction and return to occupational activities.    Baseline 68    Time 6    Period Weeks    Status New      PT LONG TERM GOAL #3   Title Patient will have a worst pain score of 2 out of 10 to indicate significant improvement in pain analog score.    Baseline 7/10    Time 6    Period Weeks    Status New      PT LONG TERM GOAL #4   Title Patient will return to workout activities with no increase in pain to indicate return to recreational tasks.    Baseline Increased pain with performance    Time 6    Period Weeks    Status New                 Plan - 02/01/20 1117     Clinical Impression Statement Performed DN to patients R UT to decrease pain and spasm before performing exercises. Began strengthening patient's shoulder and scapular muscular to decrease pain, spasm, and improve functional activities. Patient tolerated exercises well with no increase in pain.  Patient required verbal cueing to keep traps from elevating during exercises.  Patient will continue to benefit from skilled therapy to return to prior level of function.    Personal Factors and Comorbidities Profession    Comorbidities Xray tech    Examination-Activity Limitations Carry    Examination-Participation Restrictions Occupation    Stability/Clinical Decision Making Stable/Uncomplicated    Clinical Decision Making Low    Rehab Potential Good    PT Frequency 1x / week    PT Duration 6 weeks    PT Treatment/Interventions Cryotherapy;Moist Heat;Iontophoresis 4mg /ml Dexamethasone;Electrical Stimulation;Neuromuscular re-education;Therapeutic exercise;Manual techniques;Dry needling;Joint Manipulations;Spinal Manipulations    PT Next Visit Plan Progress exercises.    PT Home Exercise Plan HEP includes upper trap stretch, scapular retraction, banded shrugs.    Consulted and Agree with Plan of Care Patient           Patient will benefit from skilled therapeutic intervention in order to improve the following deficits and impairments:  Pain, Decreased coordination, Increased muscle spasms, Decreased activity tolerance, Decreased endurance, Decreased range of motion, Decreased strength  Visit Diagnosis: Cervicalgia  Cramp and spasm     Problem List Patient Active Problem List   Diagnosis Date Noted  . Neck pain 12/15/2019  . Overweight 07/20/2019  . Hypertension 03/11/2019  . Vitamin D deficiency 03/11/2019  . Allergic rhinitis 07/30/2018  . Left foot pain 03/04/2018  . Skin exam, screening for cancer 03/04/2018  . 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 Prediabetes 01/20/2016  .  Hyperlipidemia 01/20/2016   11:18 AM, 02/01/20 Margarito Liner, SPT Student Physical Therapist [place]  [phone]  Margarito Liner 02/01/2020, 11:18 AM  [place] [facility] [address] [place], [address] Phone: [phone]   Fax:  [phone]  Name: STARLETTE THUROW MRN: [id number] Date of Birth: [birth year]

## 2020-02-09 ENCOUNTER — Other Ambulatory Visit: Payer: Self-pay

## 2020-02-09 ENCOUNTER — Ambulatory Visit: Payer: No Typology Code available for payment source

## 2020-02-09 DIAGNOSIS — M542 Cervicalgia: Secondary | ICD-10-CM

## 2020-02-09 DIAGNOSIS — R252 Cramp and spasm: Secondary | ICD-10-CM

## 2020-02-09 NOTE — Therapy (Signed)
[place] [facility] [address], [place], [address] Phone: [phone]   Fax:  [phone]  Physical Therapy Treatment  Patient Details  Name: Anna Kim MRN: [id number] Date of Birth: 1975/08/06 Referring Provider (PT): Meeler, Whitney   Encounter Date: 02/09/2020   PT End of Session - 02/09/20 1610    Visit Number 3    Number of Visits 7    Date for PT Re-Evaluation 03/09/20    PT Start Time 7408    PT Stop Time 1655    PT Time Calculation (min) 45 min    Activity Tolerance Patient tolerated treatment well    Behavior During Therapy [facility] for tasks assessed/performed           Past Medical History:  Diagnosis Date   History of hypertension 01/20/2016   HLD (hyperlipidemia)    HTN (hypertension)    Migraine    Ureteral stone    Vaginal Pap smear, abnormal    Remote history of 1 abnormal pap    Past Surgical History:  Procedure Laterality Date   TONSILLECTOMY  2011    There were no vitals filed for this visit.   Subjective Assessment - 02/09/20 1608    Subjective Patient reports she had less soreness after last session when compared to her first session and has not been noticing her pain as much other than in the morning which is more of a stifness.    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    Limitations Lifting    Diagnostic tests MRI: Negative for any significant finding    Patient Stated Goals Patient would like to return to work without having any pain.    Currently in Pain? No/denies    Pain Onset More than a month ago          TREATMENT   Therapeutic exercise Seated Rows at [place] 25#  Standing Shrugs at [place] 3x10 45# Standing Shoulder Flexion 2x10 8#  Lat Pulldown 2x12 #25  Standing Upright Row OMEGA 2x10 #10 KB Swing 2x15 20#  Straight Arm Pulldown  2x12 #15 Straight Arm Pullapart GTB 2x15    Exercises performed to decrease increased spasms and pain as well as improve muscular strength   Dry needling: 0.25 X 40 mm-dry needle placed along the upper trap on the right side with patient positioned in supine.  Goal of treatment to decrease pain along the upper trap muscle.  Patient verbalizes consent to treatment.   ART to R Levator 2x5      PT Education - 02/09/20 1610    Education Details Form/Technique with Exercise    Person(s) Educated Patient    Methods Explanation;Demonstration    Comprehension Verbalized understanding;Returned demonstration            PT Short Term Goals - 01/27/20 1241      PT SHORT TERM GOAL #1   Title Patient will be independent with home exercise program to continue benefits of therapy after discharge.    Baseline Dependent with form and technique.    Time 2    Period Weeks    Status New             PT Long Term Goals - 01/27/20 1241      PT LONG TERM GOAL #1   Title Patient will be independent with home exercise program to continue  benefits of therapy after discharge.    Baseline Dependent with form and technique.    Time 6    Period Weeks    Status New    Target Date 03/10/20      PT LONG TERM GOAL #2   Title Patient will improve her focus on outcome measure score from a 68 to a 78 indicates significant improvement in cervical dysfunction and return to occupational activities.    Baseline 68    Time 6    Period Weeks    Status New      PT LONG TERM GOAL #3   Title Patient will have a worst pain score of 2 out of 10 to indicate significant improvement in pain analog score.    Baseline 7/10    Time 6    Period Weeks    Status New      PT LONG TERM GOAL #4   Title Patient will return to workout activities with no increase in pain to indicate return to recreational tasks.    Baseline Increased pain with performance    Time 6    Period Weeks    Status New                  Plan - 02/09/20 1656    Clinical Impression Statement Performed DN at beginning of session to decrease pain and spasm.  Performed PAIVM to cervical spine with no concordant pain.  Palpation to R lavatory produced concordant pain and performed ART to levator with mild increase in pain.  Continued to load traps along with strengthening and improving motor control of scapulas.  Patient symptoms decreased by end of session. Patient will continue to benefit from skilled therapy to return to prior level of function.    Personal Factors and Comorbidities Profession    Comorbidities Xray tech    Examination-Activity Limitations Carry    Examination-Participation Restrictions Occupation    Stability/Clinical Decision Making Stable/Uncomplicated    Clinical Decision Making Low    Rehab Potential Good    PT Frequency 1x / week    PT Duration 6 weeks    PT Treatment/Interventions Cryotherapy;Moist Heat;Iontophoresis 4mg /ml Dexamethasone;Electrical Stimulation;Neuromuscular re-education;Therapeutic exercise;Manual techniques;Dry needling;Joint Manipulations;Spinal Manipulations    PT Next Visit Plan Progress exercises.    PT Home Exercise Plan HEP includes upper trap stretch, scapular retraction, banded shrugs.    Consulted and Agree with Plan of Care Patient           Patient will benefit from skilled therapeutic intervention in order to improve the following deficits and impairments:  Pain, Decreased coordination, Increased muscle spasms, Decreased activity tolerance, Decreased endurance, Decreased range of motion, Decreased strength  Visit Diagnosis: Cervicalgia  Cramp and spasm     Problem List Patient Active Problem List   Diagnosis Date Noted   Neck pain 12/15/2019   Overweight 07/20/2019   Hypertension 03/11/2019   Vitamin D deficiency 03/11/2019   Allergic rhinitis 07/30/2018   Left foot pain 03/04/2018   Skin exam, screening for cancer 03/04/2018   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 [place], [address] Phone: [phone]   Fax:  [phone]  Name: Anna Kim MRN: [id number] Date of Birth: 29-May-1975

## 2020-02-15 ENCOUNTER — Other Ambulatory Visit: Payer: Self-pay

## 2020-02-15 ENCOUNTER — Ambulatory Visit: Payer: No Typology Code available for payment source

## 2020-02-15 DIAGNOSIS — R252 Cramp and spasm: Secondary | ICD-10-CM

## 2020-02-15 DIAGNOSIS — M542 Cervicalgia: Secondary | ICD-10-CM

## 2020-02-15 NOTE — Therapy (Signed)
[place] [facility] [address], [place], [address] Phone: [phone]   Fax:  [phone]  Physical Therapy Treatment  Patient Details  Name: Anna Kim MRN: [id number] Date of Birth: [birth year] Referring Provider (PT): Meeler, Whitney   Encounter Date: 02/15/2020   PT End of Session - 02/15/20 1140    Visit Number 4    Number of Visits 7    Date for PT Re-Evaluation 03/09/20    PT Start Time 1115    PT Stop Time 1200    PT Time Calculation (min) 45 min    Activity Tolerance Patient tolerated treatment well    Behavior During Therapy [facility] for tasks assessed/performed           Past Medical History:  Diagnosis Date  . History of hypertension 01/20/2016  . HLD (hyperlipidemia)   . HTN (hypertension)   . Migraine   . Ureteral stone   . Vaginal Pap smear, abnormal    Remote history of 1 abnormal pap    Past Surgical History:  Procedure Laterality Date  . TONSILLECTOMY  2011    There were no vitals filed for this visit.   Subjective Assessment - 02/15/20 1118    Subjective Patient reports decreased pain overall with ability to perform greater amount of exercises without increase in pain.    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    Limitations Lifting    Diagnostic tests MRI: Negative for any significant finding    Patient Stated Goals Patient would like to return to work without having any pain.    Currently in Pain? No/denies    Pain Onset More than a month ago             TREATMENT  Therapeutic exercise Seated Rows at [place] 25#  Shrugs [place] against manual resistance -- x 15 Standing shoulder extension in standing at [place] -- 2 x 10 5# Standing rows at [place] -- 2 x 15 35# Sitting Lat pulldown at [employer] -- 2 x 10 55# TRX push ups -- 2 x 12  Exercises performed  to decrease increased spasms and pain as well as improve muscular strength  Dry needling: 0.25 X 40 mm-dry needle placed along the upper trap and lev scap on the right side with patient positioned in supine. Goal of treatment to decrease pain along the upper trap muscle. Patient verbalizes consent to treatment.        PT Education - 02/15/20 1139    Education Details form/technique with exercise    Person(s) Educated Patient    Methods Explanation;Demonstration    Comprehension Verbalized understanding;Returned demonstration            PT Short Term Goals - 01/27/20 1241      PT SHORT TERM GOAL #1   Title Patient will be independent with home exercise program to continue benefits of therapy after discharge.    Baseline Dependent with form and technique.    Time 2    Period Weeks    Status New             PT Long Term Goals - 01/27/20 1241      PT LONG TERM GOAL #1   Title Patient will be independent with home exercise program to continue benefits of therapy after discharge.    Baseline Dependent with form and technique.  Time 6    Period Weeks    Status New    Target Date 03/10/20      PT LONG TERM GOAL #2   Title Patient will improve her focus on outcome measure score from a 68 to a 78 indicates significant improvement in cervical dysfunction and return to occupational activities.    Baseline 68    Time 6    Period Weeks    Status New      PT LONG TERM GOAL #3   Title Patient will have a worst pain score of 2 out of 10 to indicate significant improvement in pain analog score.    Baseline 7/10    Time 6    Period Weeks    Status New      PT LONG TERM GOAL #4   Title Patient will return to workout activities with no increase in pain to indicate return to recreational tasks.    Baseline Increased pain with performance    Time 6    Period Weeks    Status New                 Plan - 02/15/20 1140    Clinical Impression Statement Performed  exercises to decrease pain and improve scapular movement patterns, improvement in pain and with movement after performing manual therapy indicating decrease in spasms. Patient is making progress and has overall less pain. Patient will benefit from further skilled therapy to return to prior level of function.    Personal Factors and Comorbidities Profession    Comorbidities Xray tech    Examination-Activity Limitations Carry    Examination-Participation Restrictions Occupation    Stability/Clinical Decision Making Stable/Uncomplicated    Rehab Potential Good    PT Frequency 1x / week    PT Duration 6 weeks    PT Treatment/Interventions Cryotherapy;Moist Heat;Iontophoresis 4mg /ml Dexamethasone;Electrical Stimulation;Neuromuscular re-education;Therapeutic exercise;Manual techniques;Dry needling;Joint Manipulations;Spinal Manipulations    PT Next Visit Plan Progress exercises.    PT Home Exercise Plan HEP includes upper trap stretch, scapular retraction, banded shrugs.    Consulted and Agree with Plan of Care Patient           Patient will benefit from skilled therapeutic intervention in order to improve the following deficits and impairments:  Pain, Decreased coordination, Increased muscle spasms, Decreased activity tolerance, Decreased endurance, Decreased range of motion, Decreased strength  Visit Diagnosis: Cervicalgia  Cramp and spasm     Problem List Patient Active Problem List   Diagnosis Date Noted  . Neck pain 12/15/2019  . Overweight 07/20/2019  . Hypertension 03/11/2019  . Vitamin D deficiency 03/11/2019  . Allergic rhinitis 07/30/2018  . Left foot pain 03/04/2018  . Skin exam, screening for cancer 03/04/2018  . 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 Prediabetes 01/20/2016  . Hyperlipidemia 01/20/2016    Blythe Stanford, PT DPT 02/15/2020, 11:51 AM  [place] [facility] [address], [place],  [address] Phone: [phone]   Fax:  [phone]  Name: Anna Kim MRN: [id number] Date of Birth: 1976-01-07

## 2020-02-21 ENCOUNTER — Encounter: Payer: Self-pay | Admitting: Emergency Medicine

## 2020-02-21 ENCOUNTER — Other Ambulatory Visit: Payer: Self-pay

## 2020-02-21 ENCOUNTER — Ambulatory Visit
Admission: EM | Admit: 2020-02-21 | Discharge: 2020-02-21 | Disposition: A | Discharge status: Home / Self Care | Payer: No Typology Code available for payment source | Attending: Emergency Medicine | Admitting: Emergency Medicine

## 2020-02-21 DIAGNOSIS — U071 COVID-19: Secondary | ICD-10-CM | POA: Diagnosis present

## 2020-02-21 DIAGNOSIS — R059 Cough, unspecified: Secondary | ICD-10-CM | POA: Diagnosis present

## 2020-02-21 DIAGNOSIS — J029 Acute pharyngitis, unspecified: Secondary | ICD-10-CM | POA: Insufficient documentation

## 2020-02-21 DIAGNOSIS — J069 Acute upper respiratory infection, unspecified: Secondary | ICD-10-CM | POA: Diagnosis present

## 2020-02-21 LAB — GROUP A STREP BY PCR: Group A Strep by PCR: NOT DETECTED

## 2020-02-21 LAB — RESP PANEL BY RT-PCR (FLU A&B, COVID) ARPGX2
Influenza A by PCR: NEGATIVE
Influenza B by PCR: NEGATIVE
SARS Coronavirus 2 by RT PCR: POSITIVE — AB

## 2020-02-21 NOTE — ED Triage Notes (Signed)
Patient c/o sore throat and bodyaches that started yesterday.  Patient reports low grade fevers.

## 2020-02-21 NOTE — ED Provider Notes (Signed)
[facility]    CSN: [id number] Arrival date & time: 02/21/20  0911      History   Chief Complaint Chief Complaint  Patient presents with  . Sore Throat  . Generalized Body Aches    HPI Anna Kim is a 44 y.o. female  presenting for sore throat and body aches as well as low-grade fevers since yesterday.  Patient also wants to cough.  She denies any congestion.  Denies headaches, weakness.  Denies any chest pain or breathing difficulty.  No nausea/vomiting or diarrhea.  Patient states that her daughter recently had cold-like symptoms but got better in a day and was not Covid tested.  She says that she recently found out that her daughter's friend did test positive for Covid.  Patient is fully vaccinated for Covid.  She has been taking over-the-counter Coricidin for symptoms with some improvement.  Patient states that normally she would come in for cold-like symptoms but is concerned for Covid due to possible exposure.  She has no other complaints or concerns today.  HPI  Past Medical History:  Diagnosis Date  . History of hypertension 01/20/2016  . HLD (hyperlipidemia)   . HTN (hypertension)   . Migraine   . Ureteral stone   . Vaginal Pap smear, abnormal    Remote history of 1 abnormal pap    Patient Active Problem List   Diagnosis Date Noted  . Neck pain 12/15/2019  . Overweight 07/20/2019  . Hypertension 03/11/2019  . Vitamin D deficiency 03/11/2019  . Allergic rhinitis 07/30/2018  . Left foot pain 03/04/2018  . Skin exam, screening for cancer 03/04/2018  . 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 Prediabetes 01/20/2016  . Hyperlipidemia 01/20/2016    Past Surgical History:  Procedure Laterality Date  . TONSILLECTOMY  2011    OB History    Gravida  1   Para      Term      Preterm      AB      Living  1     SAB      TAB      Ectopic      Multiple      Live Births  1            Home Medications    Prior to Admission  medications   Medication Sig Start Date End Date Taking? Authorizing Provider  cetirizine (ZYRTEC) 5 MG tablet Take by mouth.   Yes [provider]  Cholecalciferol (VITAMIN D) 125 MCG (5000 UT) CAPS Take by mouth.   Yes [provider]  hydrochlorothiazide (MICROZIDE) 12.5 MG capsule TAKE 1 CAPSULE BY MOUTH ONCE DAILY 09/01/19  Yes Leone Haven, MD  Norethindrone Acetate-Ethinyl Estradiol (LOESTRIN 1.5/30, 21,) 1.5-30 MG-MCG tablet Take 1 tablet by mouth daily. 04/21/19  Yes Philip Aspen, CNM  pravastatin (PRAVACHOL) 40 MG tablet Take 1 tablet (40 mg total) by mouth daily. 01/26/19  Yes Leone Haven, MD  fluticasone (FLONASE) 50 MCG/ACT nasal spray Place 2 sprays into both nostrils daily. 07/20/19   Leone Haven, MD  Calcium Carb-Cholecalciferol (CALCIUM + D3 PO) Take 1 tablet by mouth daily.  03/06/19  [provider]    Family History Family History  Problem Relation Age of Onset  . Cancer Maternal Aunt        ovarian  . Hypertension Mother   . Hypertension Father   . Kidney cancer Neg Hx   . Bladder Cancer Neg Hx   .  Breast cancer Neg Hx     Social History Social History   Tobacco Use  . Smoking status: Never Smoker  . Smokeless tobacco: Never Used  Vaping Use  . Vaping Use: Never used  Substance Use Topics  . Alcohol use: Yes    Comment: occas  . Drug use: No     Allergies   Ceftriaxone, Erythromycin, and Sulfa antibiotics   Review of Systems Review of Systems  Constitutional: Positive for fatigue and fever. Negative for chills and diaphoresis.  HENT: Positive for sore throat. Negative for congestion, ear pain, rhinorrhea, sinus pressure and sinus pain.   Respiratory: Positive for cough. Negative for shortness of breath.   Gastrointestinal: Negative for abdominal pain, nausea and vomiting.  Musculoskeletal: Positive for myalgias. Negative for arthralgias.  Skin: Negative for rash.  Neurological: Negative for weakness  and headaches.  Hematological: Negative for adenopathy.     Physical Exam Triage Vital Signs ED Triage Vitals [02/21/20 0949]  Enc Vitals Group     BP      Pulse      Resp      Temp      Temp src      SpO2      Weight 175 lb (79.4 kg)     Height 5\' 6"  (1.676 m)     Head Circumference      Peak Flow      Pain Score 5     Pain Loc      Pain Edu?      Excl. in [place]?    No data found.  Updated Vital Signs BP 122/81 (BP Location: Left Arm)   Pulse 89   Temp 98.5 F (36.9 C) (Oral)   Resp 14   Ht 5\' 6"  (1.676 m)   Wt 175 lb (79.4 kg)   SpO2 98%   BMI 28.25 kg/m       Physical Exam Vitals and nursing note reviewed.  Constitutional:      General: She is not in acute distress.    Appearance: Normal appearance. She is well-developed. She is not ill-appearing or toxic-appearing.  HENT:     Head: Normocephalic and atraumatic.     Nose: Nose normal.     Mouth/Throat:     Mouth: Mucous membranes are moist.     Pharynx: Oropharynx is clear. Posterior oropharyngeal erythema present.  Eyes:     General: No scleral icterus.       Right eye: No discharge.        Left eye: No discharge.     Conjunctiva/sclera: Conjunctivae normal.  Cardiovascular:     Rate and Rhythm: Normal rate and regular rhythm.     Heart sounds: Normal heart sounds.  Pulmonary:     Effort: Pulmonary effort is normal. No respiratory distress.     Breath sounds: Normal breath sounds.  Musculoskeletal:     Cervical back: Neck supple.  Skin:    General: Skin is dry.  Neurological:     General: No focal deficit present.     Mental Status: She is alert. Mental status is at baseline.     Motor: No weakness.     Gait: Gait normal.  Psychiatric:        Mood and Affect: Mood normal.        Behavior: Behavior normal.        Thought Content: Thought content normal.      UC Treatments / Results  Labs (all labs ordered are  listed, but only abnormal results are displayed) Labs Reviewed  GROUP A  STREP BY PCR  RESP PANEL BY RT-PCR (FLU A&B, COVID) [id number]    EKG   Radiology No results found.  Procedures Procedures (including critical care time)  Medications Ordered in UC Medications - No data to display  Initial Impression / Assessment and Plan / UC Course  I have reviewed the triage vital signs and the nursing notes.  Pertinent labs & imaging results that were available during my care of the patient were reviewed by me and considered in my medical decision making (see chart for details).   44 year old female presenting for sore throat and body aches since yesterday.  All vital signs are stable.  Patient in no acute distress.  Patient is afebrile.  Molecular strep test obtained as well as respiratory panel to assess for possible flu and Covid.  Call patient to discuss results of respiratory panel which was positive for Covid.  Patient does have history of hypertension so I have referred her to the infusion clinic for MAB therapy.  Supportive care as well.  Advised patient to isolate for 10 full days from symptom onset.  ED precautions reviewed with patient.  Final Clinical Impressions(s) / UC Diagnoses   Final diagnoses:  Upper respiratory tract infection, unspecified type  Sore throat  Cough     Discharge Instructions     We will call you with the results of your strep test and flu/Covid test.  We will treat with antibiotics or antiviral medication depending on result.  I can refer you for MAB therapy if your Covid test is positive.  Continue to rest and increase fluid intake.  You can continue the Coricidin.  Consider Chloraseptic spray and cough drops for sore throat.  You have received COVID testing today either for positive exposure, concerning symptoms that could be related to COVID infection, screening purposes, or re-testing after confirmed positive.  Your test obtained today checks for active viral infection in the last 1-2 weeks. If your test is negative now,  you can still test positive later. So, if you do develop symptoms you should either get re-tested and/or isolate x 10 days. Please follow CDC guidelines.  While Rapid antigen tests come back in 15-20 minutes, send out PCR/molecular test results typically come back within 24 hours. In the mean time, if you are symptomatic, assume this could be a positive test and treat/monitor yourself as if you do have COVID.   We will call with test results. Please download the MyChart app and set up a profile to access test results.   If symptomatic, go home and rest. Push fluids. Take Tylenol as needed for discomfort. Gargle warm salt water. Throat lozenges. Take Mucinex DM or Robitussin for cough. Humidifier in bedroom to ease coughing. Warm showers. Also review the COVID handout for more information.  COVID-19 INFECTION: The incubation period of COVID-19 is approximately 14 days after exposure, with most symptoms developing in roughly 4-5 days. Symptoms may range in severity from mild to critically severe. Roughly 80% of those infected will have mild symptoms. People of any age may become infected with COVID-19 and have the ability to transmit the virus. The most common symptoms include: fever, fatigue, cough, body aches, headaches, sore throat, nasal congestion, shortness of breath, nausea, vomiting, diarrhea, changes in smell and/or taste.    COURSE OF ILLNESS Some patients may begin with mild disease which can progress quickly into critical symptoms. If your symptoms are worsening  please call ahead to the Emergency Department and proceed there for further treatment. Recovery time appears to be roughly 1-2 weeks for mild symptoms and 3-6 weeks for severe disease.   GO IMMEDIATELY TO ER FOR FEVER YOU ARE UNABLE TO GET DOWN WITH TYLENOL, BREATHING PROBLEMS, CHEST PAIN, FATIGUE, LETHARGY, INABILITY TO EAT OR DRINK, ETC  QUARANTINE AND ISOLATION: To help decrease the spread of COVID-19 please remain isolated if you  have COVID infection or are highly suspected to have COVID infection. This means -stay home and isolate to one room in the home if you live with others. Do not share a bed or bathroom with others while ill, sanitize and wipe down all countertops and keep common areas clean and disinfected. You may discontinue isolation if you have a mild case and are asymptomatic 10 days after symptom onset as long as you have been fever free >24 hours without having to take Motrin or Tylenol. If your case is more severe (meaning you develop pneumonia or are admitted in the hospital), you may have to isolate longer.   If you have been in close contact (within 6 feet) of someone diagnosed with COVID 19, you are advised to quarantine in your home for 14 days as symptoms can develop anywhere from 2-14 days after exposure to the virus. If you develop symptoms, you  must isolate.  Most current guidelines for COVID after exposure -isolate 10 days if you ARE NOT tested for COVID as long as symptoms do not develop -isolate 7 days if you are tested and remain asymptomatic -You do not necessarily need to be tested for COVID if you have + exposure and        develop   symptoms. Just isolate at home x10 days from symptom onset During this global pandemic, CDC advises to practice social distancing, try to stay at least 56ft away from others at all times. Wear a face covering. Wash and sanitize your hands regularly and avoid going anywhere that is not necessary.  KEEP IN MIND THAT THE COVID TEST IS NOT 100% ACCURATE AND YOU SHOULD STILL DO EVERYTHING TO PREVENT POTENTIAL SPREAD OF VIRUS TO OTHERS (WEAR MASK, WEAR GLOVES, [place] HANDS AND SANITIZE REGULARLY). IF INITIAL TEST IS NEGATIVE, THIS MAY NOT MEAN YOU ARE DEFINITELY NEGATIVE. MOST ACCURATE TESTING IS DONE 5-7 DAYS AFTER EXPOSURE.   It is not advised by CDC to get re-tested after receiving a positive COVID test since you can still test positive for weeks to months after you have  already cleared the virus.   *If you have not been vaccinated for COVID, I strongly suggest you consider getting vaccinated as long as there are no contraindications.      ED Prescriptions    None     PDMP not reviewed this encounter.   Danton Clap, PA-C 02/21/20 1137

## 2020-02-21 NOTE — Discharge Instructions (Addendum)
We will call you with the results of your strep test and flu/Covid test.  We will treat with antibiotics or antiviral medication depending on result.  I can refer you for MAB therapy if your Covid test is positive.  Continue to rest and increase fluid intake.  You can continue the Coricidin.  Consider Chloraseptic spray and cough drops for sore throat.  You have received COVID testing today either for positive exposure, concerning symptoms that could be related to COVID infection, screening purposes, or re-testing after confirmed positive.  Your test obtained today checks for active viral infection in the last 1-2 weeks. If your test is negative now, you can still test positive later. So, if you do develop symptoms you should either get re-tested and/or isolate x 10 days. Please follow CDC guidelines.  While Rapid antigen tests come back in 15-20 minutes, send out PCR/molecular test results typically come back within 24 hours. In the mean time, if you are symptomatic, assume this could be a positive test and treat/monitor yourself as if you do have COVID.   We will call with test results. Please download the MyChart app and set up a profile to access test results.   If symptomatic, go home and rest. Push fluids. Take Tylenol as needed for discomfort. Gargle warm salt water. Throat lozenges. Take Mucinex DM or Robitussin for cough. Humidifier in bedroom to ease coughing. Warm showers. Also review the COVID handout for more information.  COVID-19 INFECTION: The incubation period of COVID-19 is approximately 14 days after exposure, with most symptoms developing in roughly 4-5 days. Symptoms may range in severity from mild to critically severe. Roughly 80% of those infected will have mild symptoms. People of any age may become infected with COVID-19 and have the ability to transmit the virus. The most common symptoms include: fever, fatigue, cough, body aches, headaches, sore throat, nasal congestion,  shortness of breath, nausea, vomiting, diarrhea, changes in smell and/or taste.    COURSE OF ILLNESS Some patients may begin with mild disease which can progress quickly into critical symptoms. If your symptoms are worsening please call ahead to the Emergency Department and proceed there for further treatment. Recovery time appears to be roughly 1-2 weeks for mild symptoms and 3-6 weeks for severe disease.   GO IMMEDIATELY TO ER FOR FEVER YOU ARE UNABLE TO GET DOWN WITH TYLENOL, BREATHING PROBLEMS, CHEST PAIN, FATIGUE, LETHARGY, INABILITY TO EAT OR DRINK, ETC  QUARANTINE AND ISOLATION: To help decrease the spread of COVID-19 please remain isolated if you have COVID infection or are highly suspected to have COVID infection. This means -stay home and isolate to one room in the home if you live with others. Do not share a bed or bathroom with others while ill, sanitize and wipe down all countertops and keep common areas clean and disinfected. You may discontinue isolation if you have a mild case and are asymptomatic 10 days after symptom onset as long as you have been fever free >24 hours without having to take Motrin or Tylenol. If your case is more severe (meaning you develop pneumonia or are admitted in the hospital), you may have to isolate longer.   If you have been in close contact (within 6 feet) of someone diagnosed with COVID 19, you are advised to quarantine in your home for 14 days as symptoms can develop anywhere from 2-14 days after exposure to the virus. If you develop symptoms, you  must isolate.  Most current guidelines for COVID after  exposure -isolate 10 days if you ARE NOT tested for COVID as long as symptoms do not develop -isolate 7 days if you are tested and remain asymptomatic -You do not necessarily need to be tested for COVID if you have + exposure and        develop   symptoms. Just isolate at home x10 days from symptom onset During this global pandemic, CDC advises to practice  social distancing, try to stay at least 6ft away from others at all times. Wear a face covering. Wash and sanitize your hands regularly and avoid going anywhere that is not necessary.  KEEP IN MIND THAT THE COVID TEST IS NOT 100% ACCURATE AND YOU SHOULD STILL DO EVERYTHING TO PREVENT POTENTIAL SPREAD OF VIRUS TO OTHERS (WEAR MASK, WEAR GLOVES, [place] HANDS AND SANITIZE REGULARLY). IF INITIAL TEST IS NEGATIVE, THIS MAY NOT MEAN YOU ARE DEFINITELY NEGATIVE. MOST ACCURATE TESTING IS DONE 5-7 DAYS AFTER EXPOSURE.   It is not advised by CDC to get re-tested after receiving a positive COVID test since you can still test positive for weeks to months after you have already cleared the virus.   *If you have not been vaccinated for COVID, I strongly suggest you consider getting vaccinated as long as there are no contraindications.

## 2020-02-22 ENCOUNTER — Other Ambulatory Visit: Payer: Self-pay | Admitting: Nurse Practitioner

## 2020-02-22 ENCOUNTER — Telehealth: Payer: Self-pay

## 2020-02-22 DIAGNOSIS — I10 Essential (primary) hypertension: Secondary | ICD-10-CM

## 2020-02-22 DIAGNOSIS — U071 COVID-19: Secondary | ICD-10-CM

## 2020-02-22 DIAGNOSIS — E663 Overweight: Secondary | ICD-10-CM

## 2020-02-22 NOTE — Telephone Encounter (Signed)
Called and notified patient that referral to infusion center could take 72 hours before they contact her from positive COVID test , Her symptom on set date was 02/19/20 second report to infusion center initiated. Patient denies shortness of breath, except first waking in am, has cough, and body aches , afebrile this am.

## 2020-02-22 NOTE — Progress Notes (Signed)
I connected by phone with Anna Kim on 02/22/2020 at 2:25 PM to discuss the potential use of a new treatment for mild to moderate COVID-19 viral infection in non-hospitalized patients.  This patient is a 44 y.o. female that meets the FDA criteria for Emergency Use Authorization of COVID monoclonal antibody casirivimab/imdevimab, bamlanivimab/eteseviamb, or sotrovimab.  Has a (+) direct SARS-CoV-2 viral test result  Has mild or moderate COVID-19   Is NOT hospitalized due to COVID-19  Is within 10 days of symptom onset  Has at least one of the high risk factor(s) for progression to severe COVID-19 and/or hospitalization as defined in EUA.  Specific high risk criteria : BMI > 25 and Cardiovascular disease or hypertension   I have spoken and communicated the following to the patient or parent/caregiver regarding COVID monoclonal antibody treatment:  1. FDA has authorized the emergency use for the treatment of mild to moderate COVID-19 in adults and pediatric patients with positive results of direct SARS-CoV-2 viral testing who are 79 years of age and older weighing at least 40 kg, and who are at high risk for progressing to severe COVID-19 and/or hospitalization.  2. The significant known and potential risks and benefits of COVID monoclonal antibody, and the extent to which such potential risks and benefits are unknown.  3. Information on available alternative treatments and the risks and benefits of those alternatives, including clinical trials.  4. Patients treated with COVID monoclonal antibody should continue to self-isolate and use infection control measures (e.g., wear mask, isolate, social distance, avoid sharing personal items, clean and disinfect "high touch" surfaces, and frequent handwashing) according to CDC guidelines.   5. The patient or parent/caregiver has the option to accept or refuse COVID monoclonal antibody treatment.  After reviewing this information with the  patient, the patient has agreed to receive one of the available covid 19 monoclonal antibodies and will be provided an appropriate fact sheet prior to infusion. Jobe Gibbon, NP 02/22/2020 2:25 PM

## 2020-02-22 NOTE — Telephone Encounter (Signed)
I don't know how to do this but this patient wants to got to the infusion clinic.  Here is her message. " Pt said she tested positive for covid. She went to [facility] and they were supposed to send her to get an infusion but she hasn't heard from them and left messages. She wants to know if we can get her in to get the infusion done. She said she feels worse today. "  can you help with this?  Caleb Prigmore,cma

## 2020-02-22 NOTE — Telephone Encounter (Signed)
Pt said she tested positive for covid. She went to [facility] and they were supposed to send her to get an infusion but she hasn't heard from them and left messages. She wants to know if we can get her in to get the infusion done. She said she feels worse today.

## 2020-02-22 NOTE — Telephone Encounter (Signed)
Thanks for taking care of this.

## 2020-02-23 ENCOUNTER — Ambulatory Visit (HOSPITAL_COMMUNITY)
Admission: RE | Admit: 2020-02-23 | Discharge: 2020-02-23 | Disposition: A | Discharge status: Home / Self Care | Payer: No Typology Code available for payment source | Source: Ambulatory Visit | Attending: Pulmonary Disease | Admitting: Pulmonary Disease

## 2020-02-23 DIAGNOSIS — E663 Overweight: Secondary | ICD-10-CM | POA: Diagnosis not present

## 2020-02-23 DIAGNOSIS — U071 COVID-19: Secondary | ICD-10-CM | POA: Insufficient documentation

## 2020-02-23 DIAGNOSIS — I10 Essential (primary) hypertension: Secondary | ICD-10-CM | POA: Diagnosis not present

## 2020-02-23 MED ORDER — METHYLPREDNISOLONE SODIUM SUCC 125 MG IJ SOLR: 125.0000 mg | Freq: Once | INTRAMUSCULAR | Status: DC | PRN

## 2020-02-23 MED ORDER — EPINEPHRINE 0.3 MG/0.3ML IJ SOAJ: 0.3000 mg | Freq: Once | INTRAMUSCULAR | Status: DC | PRN

## 2020-02-23 MED ORDER — DIPHENHYDRAMINE HCL 50 MG/ML IJ SOLN: 50.0000 mg | Freq: Once | INTRAMUSCULAR | Status: DC | PRN

## 2020-02-23 MED ORDER — SOTROVIMAB 500 MG/8ML IV SOLN
500.0000 mg | Freq: Once | INTRAVENOUS | Status: AC
  Administered 2020-02-23: 500 mg via INTRAVENOUS

## 2020-02-23 MED ORDER — ALBUTEROL SULFATE HFA 108 (90 BASE) MCG/ACT IN AERS: 2.0000 | INHALATION_SPRAY | Freq: Once | RESPIRATORY_TRACT | Status: DC | PRN

## 2020-02-23 MED ORDER — SODIUM CHLORIDE 0.9 % IV SOLN: INTRAVENOUS | Status: DC | PRN

## 2020-02-23 MED ORDER — FAMOTIDINE IN NACL 20-0.9 MG/50ML-% IV SOLN: 20.0000 mg | Freq: Once | INTRAVENOUS | Status: DC | PRN

## 2020-02-23 NOTE — Discharge Instructions (Signed)
10 Things You Can Do to Manage Your COVID-19 Symptoms at Home If you have possible or confirmed COVID-19: 1. Stay home from work and school. And stay away from other public places. If you must go out, avoid using any kind of public transportation, ridesharing, or taxis. 2. Monitor your symptoms carefully. If your symptoms get worse, call your healthcare provider immediately. 3. Get rest and stay hydrated. 4. If you have a medical appointment, call the healthcare provider ahead of time and tell them that you have or may have COVID-19. 5. For medical emergencies, call 911 and notify the dispatch personnel that you have or may have COVID-19. 6. Cover your cough and sneezes with a tissue or use the inside of your elbow. 7. Wash your hands often with soap and water for at least 20 seconds or clean your hands with an alcohol-based hand sanitizer that contains at least 60% alcohol. 8. As much as possible, stay in a specific room and away from other people in your home. Also, you should use a separate bathroom, if available. If you need to be around other people in or outside of the home, wear a mask. 9. Avoid sharing personal items with other people in your household, like dishes, towels, and bedding. 10. Clean all surfaces that are touched often, like counters, tabletops, and doorknobs. Use household cleaning sprays or wipes according to the label instructions. cdc.gov/coronavirus 09/24/2018 This information is not intended to replace advice given to you by your health care provider. Make sure you discuss any questions you have with your health care provider. Document Revised: 02/26/2019 Document Reviewed: 02/26/2019 Elsevier Patient Education  2020 Elsevier Inc.  What types of side effects do monoclonal antibody drugs cause?  Common side effects  In general, the more common side effects caused by monoclonal antibody drugs include: . Allergic reactions, such as hives or itching . Flu-like signs and  symptoms, including chills, fatigue, fever, and muscle aches and pains . Nausea, vomiting . Diarrhea . Skin rashes . Low blood pressure   The CDC is recommending patients who receive monoclonal antibody treatments wait at least 90 days before being vaccinated.  Currently, there are no data on the safety and efficacy of mRNA COVID-19 vaccines in persons who received monoclonal antibodies or convalescent plasma as part of COVID-19 treatment. Based on the estimated half-life of such therapies as well as evidence suggesting that reinfection is uncommon in the 90 days after initial infection, vaccination should be deferred for at least 90 days, as a precautionary measure until additional information becomes available, to avoid interference of the antibody treatment with vaccine-induced immune responses.  If you have any questions or concerns after the infusion please call the Advanced Practice Provider on call at [phone]. This number is only intended for your use regarding questions or concerns about the infusion post-treatment side-effects.  Please do not provide this number to others for use.   If someone you know is interested in receiving treatment please have them call the COVID hotline at [phone].   

## 2020-02-23 NOTE — Progress Notes (Signed)
Patient reviewed Fact Sheet for Patients, Parents, and Caregivers for Emergency Use Authorization (EUA) of Sotrovimab for the Treatment of Coronavirus. Patient also reviewed and is agreeable to the estimated cost of treatment. Patient is agreeable to proceed.   

## 2020-02-23 NOTE — Progress Notes (Signed)
Diagnosis: COVID-19  Physician: Dr. Patrick Wright  Procedure: Covid Infusion Clinic Med: Sotrovimab infusion - Provided patient with sotrovimab fact sheet for patients, parents, and caregivers prior to infusion.   Complications: No immediate complications noted  Discharge: Discharged home    

## 2020-03-07 ENCOUNTER — Other Ambulatory Visit: Payer: Self-pay | Admitting: Family Medicine

## 2020-03-07 ENCOUNTER — Other Ambulatory Visit: Payer: Self-pay

## 2020-03-07 ENCOUNTER — Ambulatory Visit: Payer: No Typology Code available for payment source | Attending: Family Medicine

## 2020-03-07 DIAGNOSIS — R252 Cramp and spasm: Secondary | ICD-10-CM | POA: Insufficient documentation

## 2020-03-07 DIAGNOSIS — M542 Cervicalgia: Secondary | ICD-10-CM | POA: Insufficient documentation

## 2020-03-08 NOTE — Therapy (Signed)
[facility] [address], [place], [address] Phone: [phone]   Fax:  [phone]  Physical Therapy Treatment  Patient Details  Name: Anna Kim MRN: [id number] Date of Birth: 1975-10-25 Referring Provider (PT): Meeler, Whitney   Encounter Date: 03/07/2020   PT End of Session - 03/08/20 0740    Visit Number 5    Number of Visits 7    Date for PT Re-Evaluation 03/09/20    PT Start Time 7001    PT Stop Time 1815    PT Time Calculation (min) 45 min    Activity Tolerance Patient tolerated treatment well    Behavior During Therapy [facility] for tasks assessed/performed           Past Medical History:  Diagnosis Date   History of hypertension 01/20/2016   HLD (hyperlipidemia)    HTN (hypertension)    Migraine    Ureteral stone    Vaginal Pap smear, abnormal    Remote history of 1 abnormal pap    Past Surgical History:  Procedure Laterality Date   TONSILLECTOMY  2011    There were no vitals filed for this visit.   Subjective Assessment - 03/07/20 1732    Subjective Patient reports she only gets instantaneus pain 1-2x/week which resolves after some stretching. Patient reports she's comfortable with discahrge at this date.    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    Limitations Lifting    Diagnostic tests MRI: Negative for any significant finding    Patient Stated Goals Patient would like to return to work without having any pain.    Currently in Pain? No/denies    Pain Onset More than a month ago             TREATMENT   Therapeutic exercise Scapular retraction with black Thera-Band-10 X2 High low in standing with black theraband10 X2 Low row in standing with black theraband 10 X2 B Shoulder ER in standing 10 X2 Scapular punches in standing 10 X2  All exercises  added to HEP   Exercises performed to decrease increased spasms and pain as well as improve muscular strength   Dry needling: 0.25 X 40 mm-dry needle placed along the upper trap on the right side with patient positioned in supine.  Goal of treatment to decrease pain along the upper trap muscle.  Patient verbalizes consent to treatment.  Performed for 5 minutes- unbilled    PT Education - 03/08/20 0740    Education Details form/technique with exercise    Person(s) Educated Patient    Methods Explanation;Demonstration    Comprehension Returned demonstration;Verbalized understanding            PT Short Term Goals - 01/27/20 1241      PT SHORT TERM GOAL #1   Title Patient will be independent with home exercise program to continue benefits of therapy after discharge.    Baseline Dependent with form and technique.    Time 2    Period Weeks    Status New             PT Long Term Goals - 03/08/20 0743      PT LONG TERM GOAL #1   Title Patient will be independent with home exercise program to continue benefits of therapy after discharge.    Baseline Dependent with form and technique; 03/07/2020: Independent with form/technique  Time 6    Period Weeks    Status Achieved      PT LONG TERM GOAL #2   Title Patient will improve her focus on outcome measure score from a 68 to a 78 indicates significant improvement in cervical dysfunction and return to occupational activities.    Baseline 68; 03/07/2020: 71    Time 6    Period Weeks    Status Partially Met      PT LONG TERM GOAL #3   Title Patient will have a worst pain score of 2 out of 10 to indicate significant improvement in pain analog score.    Baseline 7/10; 03/07/2020: 3/10    Time 6    Period Weeks    Status Partially Met      PT LONG TERM GOAL #4   Title Patient will return to workout activities with no increase in pain to indicate return to recreational tasks.    Baseline Increased pain with performance;  03/07/2020: No pain at work    Time Kelly - 03/08/20 0740    Clinical Impression Statement Patient has met or partially met all long term goals at this juncture. Reportly has pain infrequently and when she does, implements activities to decrease pain further. Educated patient to call and schedule if symptoms don't continue to improve. To further patient's improvements, added exercises to HEP.    Personal Factors and Comorbidities Profession    Comorbidities Xray tech    Examination-Activity Limitations Carry    Examination-Participation Restrictions Occupation    Stability/Clinical Decision Making Stable/Uncomplicated    Rehab Potential Good    PT Frequency 1x / week    PT Duration 6 weeks    PT Treatment/Interventions Cryotherapy;Moist Heat;Iontophoresis 26m/ml Dexamethasone;Electrical Stimulation;Neuromuscular re-education;Therapeutic exercise;Manual techniques;Dry needling;Joint Manipulations;Spinal Manipulations    PT Next Visit Plan Progress exercises.    PT Home Exercise Plan HEP includes upper trap stretch, scapular retraction, banded shrugs.    Consulted and Agree with Plan of Care Patient           Patient will benefit from skilled therapeutic intervention in order to improve the following deficits and impairments:  Pain,Decreased coordination,Increased muscle spasms,Decreased activity tolerance,Decreased endurance,Decreased range of motion,Decreased strength  Visit Diagnosis: Cramp and spasm  Cervicalgia     Problem List Patient Active Problem List   Diagnosis Date Noted   Neck pain 12/15/2019   Overweight 07/20/2019   Hypertension 03/11/2019   Vitamin D deficiency 03/11/2019   Allergic rhinitis 07/30/2018   Left foot pain 03/04/2018   Skin exam, screening for cancer 03/04/2018   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 [place] [address]Phone: [phone]  Fax:  [phone] Name: ACLEATUS GOODINMRN: [id number] of Birth: [birth year]

## 2020-03-24 ENCOUNTER — Other Ambulatory Visit: Payer: Self-pay

## 2020-03-28 ENCOUNTER — Other Ambulatory Visit: Payer: Self-pay

## 2020-04-13 ENCOUNTER — Other Ambulatory Visit: Payer: Self-pay | Admitting: Family Medicine

## 2020-04-13 DIAGNOSIS — E785 Hyperlipidemia, unspecified: Secondary | ICD-10-CM

## 2020-04-19 ENCOUNTER — Encounter: Payer: Self-pay | Admitting: Family Medicine

## 2020-04-19 DIAGNOSIS — Z1283 Encounter for screening for malignant neoplasm of skin: Secondary | ICD-10-CM

## 2020-04-25 ENCOUNTER — Other Ambulatory Visit: Payer: Self-pay

## 2020-04-25 ENCOUNTER — Other Ambulatory Visit (HOSPITAL_COMMUNITY)
Admission: RE | Admit: 2020-04-25 | Discharge: 2020-04-25 | Disposition: A | Discharge status: Home / Self Care | Payer: No Typology Code available for payment source | Source: Ambulatory Visit | Attending: Certified Nurse Midwife | Admitting: Certified Nurse Midwife

## 2020-04-25 ENCOUNTER — Ambulatory Visit (INDEPENDENT_AMBULATORY_CARE_PROVIDER_SITE_OTHER): Payer: No Typology Code available for payment source | Admitting: Certified Nurse Midwife

## 2020-04-25 ENCOUNTER — Other Ambulatory Visit: Payer: Self-pay | Admitting: Certified Nurse Midwife

## 2020-04-25 ENCOUNTER — Encounter: Payer: Self-pay | Admitting: Certified Nurse Midwife

## 2020-04-25 VITALS — BP 133/88 | HR 76 | Ht 66.0 in | Wt 175.4 lb

## 2020-04-25 DIAGNOSIS — Z124 Encounter for screening for malignant neoplasm of cervix: Secondary | ICD-10-CM | POA: Insufficient documentation

## 2020-04-25 DIAGNOSIS — R7303 Prediabetes: Secondary | ICD-10-CM

## 2020-04-25 DIAGNOSIS — I10 Essential (primary) hypertension: Secondary | ICD-10-CM | POA: Diagnosis not present

## 2020-04-25 DIAGNOSIS — Z01419 Encounter for gynecological examination (general) (routine) without abnormal findings: Secondary | ICD-10-CM | POA: Insufficient documentation

## 2020-04-25 DIAGNOSIS — E785 Hyperlipidemia, unspecified: Secondary | ICD-10-CM

## 2020-04-25 DIAGNOSIS — Z1159 Encounter for screening for other viral diseases: Secondary | ICD-10-CM

## 2020-04-25 DIAGNOSIS — Z1231 Encounter for screening mammogram for malignant neoplasm of breast: Secondary | ICD-10-CM

## 2020-04-25 MED ORDER — NORETHINDRONE ACET-ETHINYL EST 1.5-30 MG-MCG PO TABS
1.0000 | ORAL_TABLET | Freq: Every day | ORAL | 11 refills | Status: DC
Start: 2020-04-25 — End: 2020-04-25

## 2020-04-25 NOTE — Patient Instructions (Signed)
Preventive Care 84-47 Years Old, Female Preventive care refers to lifestyle choices and visits with your health care provider that can promote health and wellness. This includes:  A yearly physical exam. This is also called an annual wellness visit.  Regular dental and eye exams.  Immunizations.  Screening for certain conditions.  Healthy lifestyle choices, such as: ? Eating a healthy diet. ? Getting regular exercise. ? Not using drugs or products that contain nicotine and tobacco. ? Limiting alcohol use. What can I expect for my preventive care visit? Physical exam Your health care provider will check your:  Height and weight. These may be used to calculate your BMI (body mass index). BMI is a measurement that tells if you are at a healthy weight.  Heart rate and blood pressure.  Body temperature.  Skin for abnormal spots. Counseling Your health care provider may ask you questions about your:  Past medical problems.  Family's medical history.  Alcohol, tobacco, and drug use.  Emotional well-being.  Home life and relationship well-being.  Sexual activity.  Diet, exercise, and sleep habits.  Work and work Statistician.  Access to firearms.  Method of birth control.  Menstrual cycle.  Pregnancy history. What immunizations do I need? Vaccines are usually given at various ages, according to a schedule. Your health care provider will recommend vaccines for you based on your age, medical history, and lifestyle or other factors, such as travel or where you work.   What tests do I need? Blood tests  Lipid and cholesterol levels. These may be checked every 5 years, or more often if you are over 3 years old.  Hepatitis C test.  Hepatitis B test. Screening  Lung cancer screening. You may have this screening every year starting at age 73 if you have a 30-pack-year history of smoking and currently smoke or have quit within the past 15 years.  Colorectal cancer  screening. ? All adults should have this screening starting at age 52 and continuing until age 17. ? Your health care provider may recommend screening at age 49 if you are at increased risk. ? You will have tests every 1-10 years, depending on your results and the type of screening test.  Diabetes screening. ? This is done by checking your blood sugar (glucose) after you have not eaten for a while (fasting). ? You may have this done every 1-3 years.  Mammogram. ? This may be done every 1-2 years. ? Talk with your health care provider about when you should start having regular mammograms. This may depend on whether you have a family history of breast cancer.  BRCA-related cancer screening. This may be done if you have a family history of breast, ovarian, tubal, or peritoneal cancers.  Pelvic exam and Pap test. ? This may be done every 3 years starting at age 10. ? Starting at age 11, this may be done every 5 years if you have a Pap test in combination with an HPV test. Other tests  STD (sexually transmitted disease) testing, if you are at risk.  Bone density scan. This is done to screen for osteoporosis. You may have this scan if you are at high risk for osteoporosis. Talk with your health care provider about your test results, treatment options, and if necessary, the need for more tests. Follow these instructions at home: Eating and drinking  Eat a diet that includes fresh fruits and vegetables, whole grains, lean protein, and low-fat dairy products.  Take vitamin and mineral supplements  as recommended by your health care provider.  Do not drink alcohol if: ? Your health care provider tells you not to drink. ? You are pregnant, may be pregnant, or are planning to become pregnant.  If you drink alcohol: ? Limit how much you have to 0-1 drink a day. ? Be aware of how much alcohol is in your drink. In the [place] one drink equals one 12 oz bottle of beer (355 mL), one 5 oz glass of  wine (148 mL), or one 1 oz glass of hard liquor (44 mL).   Lifestyle  Take daily care of your teeth and gums. Brush your teeth every morning and night with fluoride toothpaste. Floss one time each day.  Stay active. Exercise for at least 30 minutes 5 or more days each week.  Do not use any products that contain nicotine or tobacco, such as cigarettes, e-cigarettes, and chewing tobacco. If you need help quitting, ask your health care provider.  Do not use drugs.  If you are sexually active, practice safe sex. Use a condom or other form of protection to prevent STIs (sexually transmitted infections).  If you do not wish to become pregnant, use a form of birth control. If you plan to become pregnant, see your health care provider for a prepregnancy visit.  If told by your health care provider, take low-dose aspirin daily starting at age 50.  Find healthy ways to cope with stress, such as: ? Meditation, yoga, or listening to music. ? Journaling. ? Talking to a trusted person. ? Spending time with friends and family. Safety  Always wear your seat belt while driving or riding in a vehicle.  Do not drive: ? If you have been drinking alcohol. Do not ride with someone who has been drinking. ? When you are tired or distracted. ? While texting.  Wear a helmet and other protective equipment during sports activities.  If you have firearms in your house, make sure you follow all gun safety procedures. What's next?  Visit your health care provider once a year for an annual wellness visit.  Ask your health care provider how often you should have your eyes and teeth checked.  Stay up to date on all vaccines. This information is not intended to replace advice given to you by your health care provider. Make sure you discuss any questions you have with your health care provider. Document Revised: 12/15/2019 Document Reviewed: 11/21/2017 Elsevier Patient Education  2021 Elsevier Inc.  

## 2020-04-25 NOTE — Progress Notes (Signed)
GYNECOLOGY ANNUAL PREVENTATIVE CARE ENCOUNTER NOTE  History:     Anna Kim is a 45 y.o. G1P0 female here for a routine annual gynecologic exam.  Current complaints: none.   Denies abnormal vaginal bleeding, discharge, pelvic pain, problems with intercourse or other gynecologic concerns.     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 (Menstrual status: Oral contraceptives). Contraception: OCP (estrogen/progesterone) Last Pap: 03/23/2016. Results were: normal with negative HPV Last mammogram: 08/28/19. Results were: normal  Obstetric History OB History  Gravida Para Term Preterm AB Living  1         1  SAB IAB Ectopic Multiple Live Births          1    # Outcome Date GA Lbr Len/2nd Weight Sex Delivery Anes PTL Lv  1 Gravida 2006    F Vag-Spont   LIV    Past Medical History:  Diagnosis Date  . History of hypertension 01/20/2016  . HLD (hyperlipidemia)   . HTN (hypertension)   . Migraine   . Ureteral stone   . Vaginal Pap smear, abnormal    Remote history of 1 abnormal pap    Past Surgical History:  Procedure Laterality Date  . TONSILLECTOMY  2011    Current Outpatient Medications on File Prior to Visit  Medication Sig Dispense Refill  . cetirizine (ZYRTEC) 5 MG tablet Take by mouth.    . Cholecalciferol (VITAMIN D) 125 MCG (5000 UT) CAPS Take by mouth.    . fluticasone (FLONASE) 50 MCG/ACT nasal spray Place 2 sprays into both nostrils daily. 16 g 1  . hydrochlorothiazide (MICROZIDE) 12.5 MG capsule TAKE 1 CAPSULE BY MOUTH ONCE DAILY 90 capsule 1  . Norethindrone Acetate-Ethinyl Estradiol (LOESTRIN 1.5/30, 21,) 1.5-30 MG-MCG tablet Take 1 tablet by mouth daily. 3 Package 4  . pravastatin (PRAVACHOL) 40 MG tablet TAKE 1 TABLET (40 MG TOTAL) BY MOUTH DAILY. 90 tablet 3   . [DISCONTINUED] Calcium Carb-Cholecalciferol (CALCIUM + D3 PO) Take 1 tablet by mouth daily.     No current facility-administered medications on file prior to visit.    Allergies  Allergen Reactions  . Ceftriaxone Hives  . Erythromycin Other (See Comments)    GI Upset  . Sulfa Antibiotics Rash    Social History:  reports that she has never smoked. She has never used smokeless tobacco. She reports current alcohol use. She reports that she does not use drugs.  Family History  Problem Relation Age of Onset  . Cancer Maternal Aunt        ovarian  . Hypertension Mother   . Hypertension Father   . Kidney cancer Neg Hx   . Bladder Cancer Neg Hx   . Breast cancer Neg Hx     The following portions of the patient's history were reviewed and updated as appropriate: allergies, current medications, past family history, past medical history, past social history, past surgical history and problem list.  Review of Systems Pertinent items noted in HPI and remainder of comprehensive ROS otherwise negative.  Physical Exam:  BP 133/88   Pulse 76   Ht 5\' 6"  (1.676 m)   Wt 175 lb 6 oz (79.5 kg)   LMP  (LMP Unknown)   BMI 28.31 kg/m  CONSTITUTIONAL: Well-developed, well-nourished female in no acute distress.  HENT:  Normocephalic,  atraumatic, External right and left ear normal. Oropharynx is clear and moist EYES: Conjunctivae and EOM are normal. Pupils are equal, round, and reactive to light. No scleral icterus.  NECK: Normal range of motion, supple, no masses.  Normal thyroid.  SKIN: Skin is warm and dry. No rash noted. Not diaphoretic. No erythema. No pallor. MUSCULOSKELETAL: Normal range of motion. No tenderness.  No cyanosis, clubbing, or edema.  2+ distal pulses. NEUROLOGIC: Alert and oriented to person, place, and time. Normal reflexes, muscle tone coordination.  PSYCHIATRIC: Normal mood and affect. Normal behavior. Normal judgment and thought content. CARDIOVASCULAR: Normal heart  rate noted, regular rhythm RESPIRATORY: Clear to auscultation bilaterally. Effort and breath sounds normal, no problems with respiration noted. BREASTS: Symmetric in size. No masses, tenderness, skin changes, nipple drainage, or lymphadenopathy bilaterally.  ABDOMEN: Soft, no distention noted.  No tenderness, rebound or guarding.  PELVIC: Normal appearing external genitalia and urethral meatus; normal appearing vaginal mucosa and cervix.  No abnormal discharge noted.  Pap smear obtained.  Contact bleeding present.  Normal uterine size, no other palpable masses, no uterine or adnexal tenderness.  .   Assessment and Plan:    1. Women's annual routine gynecological examination    Pap:Will follow up results of pap smear and manage accordingly. Mammogram : ordered  Labs:Hep C ordered- will have done at hospital with her other labs from her PCP Refills:ocp Referral: none Routine preventative health maintenance measures emphasized. Please refer to After Visit Summary for other counseling recommendations.      Philip Aspen, CNM [facility]

## 2020-04-28 ENCOUNTER — Other Ambulatory Visit: Payer: Self-pay

## 2020-04-29 LAB — CYTOLOGY - PAP
Comment: NEGATIVE
Diagnosis: NEGATIVE
High risk HPV: NEGATIVE

## 2020-05-16 ENCOUNTER — Other Ambulatory Visit: Payer: Self-pay | Admitting: Dermatology

## 2020-05-18 ENCOUNTER — Other Ambulatory Visit: Payer: Self-pay

## 2020-05-18 ENCOUNTER — Other Ambulatory Visit
Admission: RE | Admit: 2020-05-18 | Discharge: 2020-05-18 | Disposition: A | Discharge status: Home / Self Care | Payer: No Typology Code available for payment source | Attending: Certified Nurse Midwife | Admitting: Certified Nurse Midwife

## 2020-05-18 ENCOUNTER — Other Ambulatory Visit
Admission: RE | Admit: 2020-05-18 | Discharge: 2020-05-18 | Disposition: A | Discharge status: Home / Self Care | Payer: No Typology Code available for payment source | Attending: Family Medicine | Admitting: Family Medicine

## 2020-05-18 DIAGNOSIS — I10 Essential (primary) hypertension: Secondary | ICD-10-CM | POA: Diagnosis present

## 2020-05-18 DIAGNOSIS — E785 Hyperlipidemia, unspecified: Secondary | ICD-10-CM | POA: Diagnosis not present

## 2020-05-18 DIAGNOSIS — R7303 Prediabetes: Secondary | ICD-10-CM | POA: Diagnosis present

## 2020-05-18 DIAGNOSIS — Z1159 Encounter for screening for other viral diseases: Secondary | ICD-10-CM

## 2020-05-18 LAB — LIPID PANEL
Cholesterol: 164 mg/dL (ref 0–200)
HDL: 48 mg/dL (ref >40)
LDL Cholesterol: 98 mg/dL (ref 0–99)
Total CHOL/HDL Ratio: 3.4 RATIO
Triglycerides: 91 mg/dL (ref <150)
VLDL: 18 mg/dL (ref 0–40)

## 2020-05-18 LAB — COMPREHENSIVE METABOLIC PANEL
ALT: 12 U/L (ref 0–44)
AST: 19 U/L (ref 15–41)
Albumin: 3.9 g/dL (ref 3.5–5.0)
Alkaline Phosphatase: 54 U/L (ref 38–126)
Anion gap: 8 (ref 5–15)
BUN: 15 mg/dL (ref 6–20)
CO2: 28 mmol/L (ref 22–32)
Calcium: 9.2 mg/dL (ref 8.9–10.3)
Chloride: 101 mmol/L (ref 98–111)
Creatinine, Ser: 0.8 mg/dL (ref 0.44–1.00)
GFR, Estimated: >60 mL/min (ref >60)
Glucose, Bld: 97 mg/dL (ref 70–99)
Potassium: 3.1 mmol/L — ABNORMAL LOW (ref 3.5–5.1)
Sodium: 137 mmol/L (ref 135–145)
Total Bilirubin: 0.8 mg/dL (ref 0.3–1.2)
Total Protein: 7.4 g/dL (ref 6.5–8.1)

## 2020-05-18 LAB — HEPATITIS C ANTIBODY: HCV Ab: NONREACTIVE

## 2020-05-18 LAB — HEMOGLOBIN A1C
Hgb A1c MFr Bld: 5.5 % (ref 4.8–5.6)
Mean Plasma Glucose: 111.15 mg/dL

## 2020-05-23 ENCOUNTER — Encounter: Payer: Self-pay | Admitting: Family Medicine

## 2020-05-23 ENCOUNTER — Other Ambulatory Visit: Payer: Self-pay | Admitting: Family Medicine

## 2020-05-23 ENCOUNTER — Telehealth: Payer: Self-pay

## 2020-05-23 DIAGNOSIS — E876 Hypokalemia: Secondary | ICD-10-CM

## 2020-05-23 NOTE — Telephone Encounter (Signed)
-----   Message from Leone Haven, MD sent at 05/23/2020 10:04 AM EST ----- See Deloris Ping message.

## 2020-05-23 NOTE — Telephone Encounter (Signed)
Can you call in potassium chloride (klor-con) 20 mEq tablets with a dose of two tablets (40 mEq) once daily by mouth for a total of 3 days (6 tablets, no refills)?  Please get a potassium ordered for one week from now as well. Thanks.

## 2020-06-03 ENCOUNTER — Other Ambulatory Visit: Payer: Self-pay

## 2020-06-03 ENCOUNTER — Other Ambulatory Visit
Admission: RE | Admit: 2020-06-03 | Discharge: 2020-06-03 | Disposition: A | Discharge status: Home / Self Care | Payer: No Typology Code available for payment source | Attending: Family Medicine | Admitting: Family Medicine

## 2020-06-03 DIAGNOSIS — E876 Hypokalemia: Secondary | ICD-10-CM | POA: Diagnosis present

## 2020-06-03 LAB — POTASSIUM: Potassium: 3.8 mmol/L (ref 3.5–5.1)

## 2020-06-04 DIAGNOSIS — Z1159 Encounter for screening for other viral diseases: Secondary | ICD-10-CM

## 2020-07-18 MED FILL — Pravastatin Sodium Tab 40 MG: ORAL | 90 days supply | Qty: 90 | Fill #0 | Status: AC

## 2020-07-19 ENCOUNTER — Other Ambulatory Visit: Payer: Self-pay

## 2020-07-26 ENCOUNTER — Other Ambulatory Visit: Payer: Self-pay

## 2020-08-01 ENCOUNTER — Encounter: Payer: Self-pay | Admitting: Family Medicine

## 2020-08-01 ENCOUNTER — Ambulatory Visit (INDEPENDENT_AMBULATORY_CARE_PROVIDER_SITE_OTHER): Payer: No Typology Code available for payment source | Admitting: Family Medicine

## 2020-08-01 ENCOUNTER — Other Ambulatory Visit: Payer: Self-pay

## 2020-08-01 DIAGNOSIS — I10 Essential (primary) hypertension: Secondary | ICD-10-CM

## 2020-08-01 DIAGNOSIS — M25511 Pain in right shoulder: Secondary | ICD-10-CM | POA: Insufficient documentation

## 2020-08-01 DIAGNOSIS — E785 Hyperlipidemia, unspecified: Secondary | ICD-10-CM | POA: Diagnosis not present

## 2020-08-01 DIAGNOSIS — M2550 Pain in unspecified joint: Secondary | ICD-10-CM | POA: Diagnosis not present

## 2020-08-01 LAB — BASIC METABOLIC PANEL
BUN: 13 mg/dL (ref 6–23)
CO2: 28 mEq/L (ref 19–32)
Calcium: 9.4 mg/dL (ref 8.4–10.5)
Chloride: 101 mEq/L (ref 96–112)
Creatinine, Ser: 0.78 mg/dL (ref 0.40–1.20)
GFR: 91.05 mL/min (ref >60.00)
Glucose, Bld: 97 mg/dL (ref 70–99)
Potassium: 3.2 mEq/L — ABNORMAL LOW (ref 3.5–5.1)
Sodium: 139 mEq/L (ref 135–145)

## 2020-08-01 NOTE — Patient Instructions (Signed)
Nice to see you. We will get labs today and let you know what the results are. If your joints continue to hurt despite starting the collagen supplement that please let me know.

## 2020-08-01 NOTE — Assessment & Plan Note (Signed)
Likely related to the physical activity she was doing.  She has no symptoms currently.  I discussed she could try starting back on the collagen supplement if it was beneficial.

## 2020-08-01 NOTE — Progress Notes (Signed)
Tommi Rumps, MD Phone: [phone]  Anna Kim is a 45 y.o. female who presents today for f/u.  HYPERTENSION  Disease Monitoring  Home BP Monitoring not checking Chest pain- no    Dyspnea- no Medications  Compliance-  Taking HCTZ.   Edema- no Had low potassium previously.  HYPERLIPIDEMIA Symptoms Chest pain on exertion:  no    Medications: Compliance- taking pravastatin Right upper quadrant pain- no  Muscle aches- no Diet has been low-fat, low-carb, low sugar.  She does [employer] 3 days a week.  Also walks in the evenings.  Joint pain: Patient notes she had a period of joint pain after stopping her collagen supplement.  She does go to the gym and does a [employer] and she wonders if that was playing a role.  Notes her joints do not bother her at this time.   Social History   Tobacco Use  Smoking Status Never Smoker  Smokeless Tobacco Never Used    Current Outpatient Medications on File Prior to Visit  Medication Sig Dispense Refill  . cetirizine (ZYRTEC) 5 MG tablet Take by mouth.    . Cholecalciferol (VITAMIN D) 125 MCG (5000 UT) CAPS Take by mouth.    . hydrochlorothiazide (MICROZIDE) 12.5 MG capsule TAKE 1 CAPSULE BY MOUTH ONCE DAILY 90 capsule 1  . ketoconazole (NIZORAL) 2 % cream APPLY TO THE AFFECTED AREA(S) 2 TIMES A DAY 60 g 3  . Norethindrone Acetate-Ethinyl Estradiol (LOESTRIN) 1.5-30 MG-MCG tablet TAKE 1 TABLET BY MOUTH DAILY. 21 tablet 11  . pravastatin (PRAVACHOL) 40 MG tablet TAKE 1 TABLET BY MOUTH DAILY. 90 tablet 3  . RHOFADE 1 % CREA APPLY ONE PEA SIZE AMOUNT ON EACH CHEEK AND SPREAD TO ALL AFFECTED AREAS    . fluticasone (FLONASE) 50 MCG/ACT nasal spray Place 2 sprays into both nostrils daily. 16 g 1  . [DISCONTINUED] Calcium Carb-Cholecalciferol (CALCIUM + D3 PO) Take 1 tablet by mouth daily.     No current facility-administered medications on file prior to visit.     ROS see history of present illness  Objective  Physical Exam Vitals:    08/01/20 0836  BP: 120/78  Pulse: 71  Temp: 98.1 F (36.7 C)  SpO2: 99%    BP Readings from Last 3 Encounters:  08/01/20 120/78  04/25/20 133/88  02/23/20 131/88   Wt Readings from Last 3 Encounters:  08/01/20 175 lb 12.8 oz (79.7 kg)  04/25/20 175 lb 6 oz (79.5 kg)  02/21/20 175 lb (79.4 kg)    Physical Exam Constitutional:      General: She is not in acute distress.    Appearance: She is not diaphoretic.  Cardiovascular:     Rate and Rhythm: Normal rate and regular rhythm.     Heart sounds: Normal heart sounds.  Pulmonary:     Effort: Pulmonary effort is normal.     Breath sounds: Normal breath sounds.  Musculoskeletal:     Right lower leg: No edema.     Left lower leg: No edema.     Comments: Bilateral knees with no tenderness or swelling  Skin:    General: Skin is warm and dry.  Neurological:     Mental Status: She is alert.      Assessment/Plan: Please see individual problem list.  Problem List Items Addressed This Visit    Hyperlipidemia    Well-controlled on last check.  She will continue pravastatin 40 mg once daily.  She will continue diet and exercise.  Hypertension    Well-controlled.  Given her prior hypokalemia we will recheck a BMP.  She will continue on HCTZ.  Discussed if her potassium is low again we would consider switching her to an alternative blood pressure medication.      Joint pain    Likely related to the physical activity she was doing.  She has no symptoms currently.  I discussed she could try starting back on the collagen supplement if it was beneficial.          Health Maintenance: The patient has declined COVID booster vaccine at this time due to receiving the first 2 vaccines, having COVID, and having received the infusion.  I encouraged her to consider getting this when she is ready.     This visit occurred during the SARS-CoV-2 public health emergency.  Safety protocols were in place, including screening questions  prior to the visit, additional usage of staff PPE, and extensive cleaning of exam room while observing appropriate contact time as indicated for disinfecting solutions.    Tommi Rumps, MD [place]

## 2020-08-01 NOTE — Assessment & Plan Note (Signed)
Well-controlled on last check.  She will continue pravastatin 40 mg once daily.  She will continue diet and exercise.

## 2020-08-01 NOTE — Assessment & Plan Note (Signed)
Well-controlled.  Given her prior hypokalemia we will recheck a BMP.  She will continue on HCTZ.  Discussed if her potassium is low again we would consider switching her to an alternative blood pressure medication.

## 2020-08-03 ENCOUNTER — Other Ambulatory Visit: Payer: Self-pay

## 2020-08-03 ENCOUNTER — Other Ambulatory Visit: Payer: Self-pay | Admitting: Family Medicine

## 2020-08-03 DIAGNOSIS — I10 Essential (primary) hypertension: Secondary | ICD-10-CM

## 2020-08-03 MED ORDER — AMLODIPINE BESYLATE 5 MG PO TABS
5.0000 mg | ORAL_TABLET | Freq: Every day | ORAL | 2 refills | Status: DC
  Filled 2020-08-03: qty 90, 90d supply, fill #0
  Filled 2020-10-31: qty 90, 90d supply, fill #1
  Filled 2021-01-24: qty 90, 90d supply, fill #2

## 2020-08-05 ENCOUNTER — Telehealth: Payer: Self-pay

## 2020-08-05 NOTE — Telephone Encounter (Signed)
-----   Message from Leone Haven, MD sent at 08/03/2020 12:47 PM EDT ----- Amlodipine sent to pharmacy. Please get her scheduled for a nurse BP check in one month to follow-up on her BP. Thanks.

## 2020-08-05 NOTE — Telephone Encounter (Signed)
I called the patient and informed her that her medication was sent to the pharmacy. Patient will call back to schedule a nurse visit in 1 month.  Anneka Studer,cma

## 2020-08-31 ENCOUNTER — Other Ambulatory Visit: Payer: Self-pay

## 2020-08-31 ENCOUNTER — Ambulatory Visit (INDEPENDENT_AMBULATORY_CARE_PROVIDER_SITE_OTHER): Payer: No Typology Code available for payment source

## 2020-08-31 VITALS — BP 122/68 | HR 66

## 2020-08-31 DIAGNOSIS — I10 Essential (primary) hypertension: Secondary | ICD-10-CM | POA: Diagnosis not present

## 2020-08-31 NOTE — Progress Notes (Signed)
Patient is here for a BP check due to bp being high at last visit, as per patient.  Currently patients BP is 122/68 and BPM is 66.  Patient has no complaints of headaches, blurry vision, chest pain, arm pain, light headedness, dizziness, and nor jaw pain. Please see previous note for order.

## 2020-09-14 ENCOUNTER — Encounter: Payer: Self-pay | Admitting: Emergency Medicine

## 2020-09-14 ENCOUNTER — Ambulatory Visit (INDEPENDENT_AMBULATORY_CARE_PROVIDER_SITE_OTHER): Payer: No Typology Code available for payment source

## 2020-09-14 ENCOUNTER — Other Ambulatory Visit: Payer: Self-pay

## 2020-09-14 ENCOUNTER — Ambulatory Visit
Admission: EM | Admit: 2020-09-14 | Discharge: 2020-09-14 | Disposition: A | Discharge status: Home / Self Care | Payer: No Typology Code available for payment source | Attending: Sports Medicine | Admitting: Sports Medicine

## 2020-09-14 DIAGNOSIS — M25571 Pain in right ankle and joints of right foot: Secondary | ICD-10-CM

## 2020-09-14 DIAGNOSIS — S99911A Unspecified injury of right ankle, initial encounter: Secondary | ICD-10-CM

## 2020-09-14 DIAGNOSIS — S93401A Sprain of unspecified ligament of right ankle, initial encounter: Secondary | ICD-10-CM | POA: Diagnosis not present

## 2020-09-14 DIAGNOSIS — M25471 Effusion, right ankle: Secondary | ICD-10-CM

## 2020-09-14 NOTE — Discharge Instructions (Signed)
Your x-ray does not show a fracture.  Radiology is going to over read.  If they disagree with my reading that I will contact you. Please see educational handouts. I provided you a work note. Please ice and elevate several times a day.  Over-the-counter meds as needed. I provided you some home exercises to do which should speed your recovery. If you are having difficulty progressing, please make an appointment with your primary care provider or an appoint with orthopedics to be evaluated and get a referral for formal physical therapy. You have a boot and I encourage you to use that as needed. You can also purchase a lace up ankle brace at a local drugstore.  This may help you with compression.

## 2020-09-14 NOTE — ED Provider Notes (Addendum)
[facility]    CSN: [id number] Arrival date & time: 09/14/20  0805      History   Chief Complaint Chief Complaint  Patient presents with   Ankle Pain    right    HPI Anna Kim is a 45 y.o. female.   Patient is a pleasant 45 year old female who presents for evaluation of an injury to her right ankle.  Date of injury was yesterday 09/13/2020.  She was playing softball last evening and her ankle rolled into inversion.  She has an antalgic gait and pain mostly laterally but a little bit medially.  She has had plantar fasciitis in the past and does have a long boot that is in the car and she can use it.  She works in the [employer] as a Therapist, music.  She did not feel or hear a pop.  She has no chronic problems with this ankle.  No red flag signs or symptoms elicited on history.   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 Authorizing Provider  zinc gluconate 50 MG tablet Take 50 mg by mouth daily.    Yes [provider]  amLODipine (NORVASC) 5 MG tablet Take 1 tablet (5 mg total) by mouth daily. 08/03/20   Leone Haven, MD  cetirizine (ZYRTEC) 5 MG tablet Take by mouth.    [provider]  Cholecalciferol (VITAMIN D) 125 MCG (5000 UT) CAPS Take by mouth.    [provider]  fluticasone (FLONASE) 50 MCG/ACT nasal spray Place 2 sprays into both nostrils daily. 07/20/19   Leone Haven, MD  ketoconazole (NIZORAL) 2 % cream APPLY TO THE AFFECTED AREA(S) 2 TIMES A DAY 05/16/20 05/16/21  Benitez-Graham, Cori Razor, MD  Norethindrone Acetate-Ethinyl Estradiol (LOESTRIN) 1.5-30 MG-MCG tablet TAKE 1 TABLET BY MOUTH DAILY. 04/25/20 04/25/21  Philip Aspen, CNM  pravastatin (PRAVACHOL) 40 MG tablet TAKE 1 TABLET BY MOUTH DAILY. 04/13/20 04/13/21  Leone Haven, MD  RHOFADE 1 % CREA APPLY ONE PEA SIZE AMOUNT ON EACH CHEEK AND SPREAD TO ALL AFFECTED AREAS 05/16/20   [provider]  Calcium Carb-Cholecalciferol (CALCIUM + D3  PO) Take 1 tablet by mouth daily.  03/06/19  [provider]    Family History Family History  Problem Relation Age of Onset   Cancer Maternal Aunt        ovarian   Hypertension Mother    Hypertension Father    Atrial fibrillation Father    Kidney cancer Neg Hx    Bladder Cancer Neg Hx    Breast cancer Neg Hx     Social History Social History   Tobacco Use   Smoking status: Never   Smokeless tobacco: Never  Vaping Use   Vaping Use: Never used  Substance Use Topics   Alcohol use: Yes    Comment: occas   Drug use: No     Allergies   Ceftriaxone, Erythromycin, and Sulfa antibiotics   Review of Systems Review of Systems  Constitutional:  Positive for activity change. Negative for appetite change, chills, diaphoresis, fatigue and fever.  HENT:  Negative for congestion, ear pain, postnasal drip, rhinorrhea, sinus pressure, sinus pain, sneezing and sore throat.   Eyes:  Negative for pain.  Respiratory:  Negative  for cough, chest tightness and shortness of breath.   Cardiovascular:  Negative for chest pain and palpitations.  Gastrointestinal:  Negative for abdominal pain, diarrhea, nausea and vomiting.  Genitourinary:  Negative for dysuria.  Musculoskeletal:  Positive for arthralgias, gait problem and joint swelling. Negative for back pain, myalgias, neck pain and neck stiffness.  Skin:  Positive for color change. Negative for pallor, rash and wound.  Neurological:  Negative for dizziness, light-headedness, numbness and headaches.  All other systems reviewed and are negative.   Physical Exam Triage Vital Signs ED Triage Vitals  Enc Vitals Group     BP 09/14/20 0828 132/83     Pulse Rate 09/14/20 0828 72     Resp 09/14/20 0828 18     Temp 09/14/20 0828 99.1 F (37.3 C)     Temp Source 09/14/20 0828 Oral     SpO2 09/14/20 0828 98 %     Weight --      Height --      Head Circumference --      Peak Flow --      Pain Score 09/14/20 0824 4     Pain Loc --      Pain Edu? --      Excl. in [place]? --    No data found.  Updated Vital Signs BP 132/83 (BP Location: Left Arm)   Pulse 72   Temp 99.1 F (37.3 C) (Oral)   Resp 18   SpO2 98%   Visual Acuity Right Eye Distance:   Left Eye Distance:   Bilateral Distance:    Right Eye Near:   Left Eye Near:    Bilateral Near:     Physical Exam Constitutional:      Appearance: Normal appearance.  HENT:     Head: Normocephalic and atraumatic.     Nose: Nose normal.     Mouth/Throat:     Mouth: Mucous membranes are moist.  Eyes:     Conjunctiva/sclera: Conjunctivae normal.     Pupils: Pupils are equal, round, and reactive to light.  Cardiovascular:     Rate and Rhythm: Normal rate and regular rhythm.     Pulses: Normal pulses.     Heart sounds: Normal heart sounds. No murmur heard.   No friction rub. No gallop.  Pulmonary:     Effort: Pulmonary effort is normal.  Breath sounds: Normal breath sounds. No stridor. No wheezing, rhonchi  or rales.  Musculoskeletal:        General: Swelling, tenderness and signs of injury present.     Cervical back: Normal range of motion and neck supple.     Right ankle: Swelling and ecchymosis present. No deformity or lacerations. Tenderness present over the lateral malleolus, medial malleolus, ATF ligament and CF ligament. No posterior TF ligament, base of 5th metatarsal or proximal fibula tenderness. Decreased range of motion. Anterior drawer test negative. Normal pulse.     Right Achilles Tendon: Normal.     Left ankle: Normal.  Skin:    General: Skin is warm and dry.     Capillary Refill: Capillary refill takes less than 2 seconds.     Findings: Bruising present.  Neurological:     General: No focal deficit present.     Mental Status: She is alert and oriented to person, place, and time.     UC Treatments / Results  Labs (all labs ordered are listed, but only abnormal results are displayed) Labs Reviewed - No data to display  EKG   Radiology No results found.  Procedures Procedures (including critical care time)  Medications Ordered in UC Medications - No data to display  Initial Impression / Assessment and Plan / UC Course  I have reviewed the triage vital signs and the nursing notes.  Pertinent labs & imaging results that were available during my care of the patient were reviewed by me and considered in my medical decision making (see chart for details).  Clinical impression: Right inversion ankle sprain with swelling, tenderness palpation, and a mild antalgic gait.  Treatment plan: 1.  The findings and treatment plan were discussed in detail with the patient.  Patient was in agreement. 2.  We will go ahead and get an x-ray.  It was ordered and interpreted by myself independently.  No acute fracture or dislocation.  Ankle mortise is well-preserved.  There is surrounding soft tissue swelling laterally.  No acute osseous findings. 3.  We will give her a work note  just giving her some minimal restrictions. 4.  Educational handouts provided. 5.  Icing elevation, over-the-counter meds as needed. 6.  Home exercises were provided.  If she needs physical therapy she needs to follow-up with her primary care physician or go to orthopedics for a PT order.  I think she will do fine in the outpatient setting on her own. 7.  She was discharged in stable condition and will follow-up here as needed.    Final Clinical Impressions(s) / UC Diagnoses   Final diagnoses:  Right ankle injury, initial encounter  Acute right ankle pain  Right ankle swelling  Inversion sprain of right ankle, initial encounter     Discharge Instructions      Your x-ray does not show a fracture.  Radiology is going to over read.  If they disagree with my reading that I will contact you. Please see educational handouts. I provided you a work note. Please ice and elevate several times a day.  Over-the-counter meds as needed. I provided you some home exercises to do which should speed your recovery. If you are having difficulty progressing, please make an appointment with your primary care provider or an appoint with orthopedics to be evaluated and get a referral for formal physical therapy. You have a boot and I encourage you to use that as needed. You can also purchase a lace up ankle brace  at a local drugstore.  This may help you with compression.     ED Prescriptions   None    PDMP not reviewed this encounter.   Verda Cumins, MD 09/14/20 [id number]    Verda Cumins, MD 09/14/20 [phone]

## 2020-09-14 NOTE — ED Triage Notes (Signed)
Pt presents today with c/o of right ankle pain and swelling. She reports rolling ankle while playing softball yesterday. Pain with ambulation. No deformity. She took Aleve this a.m.

## 2020-09-20 ENCOUNTER — Ambulatory Visit
Admission: RE | Admit: 2020-09-20 | Discharge: 2020-09-20 | Disposition: A | Discharge status: Home / Self Care | Payer: No Typology Code available for payment source | Source: Ambulatory Visit | Attending: Certified Nurse Midwife | Admitting: Certified Nurse Midwife

## 2020-09-20 ENCOUNTER — Other Ambulatory Visit: Payer: Self-pay

## 2020-09-20 DIAGNOSIS — Z1231 Encounter for screening mammogram for malignant neoplasm of breast: Secondary | ICD-10-CM | POA: Insufficient documentation

## 2020-09-20 DIAGNOSIS — Z01419 Encounter for gynecological examination (general) (routine) without abnormal findings: Secondary | ICD-10-CM | POA: Insufficient documentation

## 2020-09-21 ENCOUNTER — Encounter: Payer: Self-pay | Admitting: Family Medicine

## 2020-10-10 MED FILL — Norethindrone Ace & Ethinyl Estradiol Tab 1.5 MG-30 MCG: ORAL | 63 days supply | Qty: 63 | Fill #0 | Status: AC

## 2020-10-11 ENCOUNTER — Other Ambulatory Visit: Payer: Self-pay

## 2020-10-18 ENCOUNTER — Other Ambulatory Visit: Payer: Self-pay

## 2020-10-18 MED FILL — Pravastatin Sodium Tab 40 MG: ORAL | 90 days supply | Qty: 90 | Fill #1 | Status: AC

## 2020-11-01 ENCOUNTER — Other Ambulatory Visit: Payer: Self-pay

## 2021-01-01 MED FILL — Norethindrone Ace & Ethinyl Estradiol Tab 1.5 MG-30 MCG: ORAL | 63 days supply | Qty: 63 | Fill #1 | Status: AC

## 2021-01-02 ENCOUNTER — Other Ambulatory Visit: Payer: Self-pay

## 2021-01-18 MED FILL — Pravastatin Sodium Tab 40 MG: ORAL | 90 days supply | Qty: 90 | Fill #2 | Status: AC

## 2021-01-19 ENCOUNTER — Other Ambulatory Visit: Payer: Self-pay

## 2021-01-25 ENCOUNTER — Other Ambulatory Visit: Payer: Self-pay

## 2021-02-01 ENCOUNTER — Other Ambulatory Visit: Payer: Self-pay

## 2021-02-01 ENCOUNTER — Ambulatory Visit (INDEPENDENT_AMBULATORY_CARE_PROVIDER_SITE_OTHER): Payer: No Typology Code available for payment source | Admitting: Family Medicine

## 2021-02-01 ENCOUNTER — Encounter: Payer: Self-pay | Admitting: Family Medicine

## 2021-02-01 DIAGNOSIS — R12 Heartburn: Secondary | ICD-10-CM | POA: Insufficient documentation

## 2021-02-01 DIAGNOSIS — S93401D Sprain of unspecified ligament of right ankle, subsequent encounter: Secondary | ICD-10-CM | POA: Diagnosis not present

## 2021-02-01 DIAGNOSIS — K219 Gastro-esophageal reflux disease without esophagitis: Secondary | ICD-10-CM | POA: Insufficient documentation

## 2021-02-01 DIAGNOSIS — E785 Hyperlipidemia, unspecified: Secondary | ICD-10-CM

## 2021-02-01 DIAGNOSIS — I10 Essential (primary) hypertension: Secondary | ICD-10-CM | POA: Diagnosis not present

## 2021-02-01 DIAGNOSIS — S93401A Sprain of unspecified ligament of right ankle, initial encounter: Secondary | ICD-10-CM | POA: Insufficient documentation

## 2021-02-01 NOTE — Assessment & Plan Note (Signed)
Well-controlled.  She will continue amlodipine 5 mg once daily.  We will check a BMP given her prior history of low potassium.

## 2021-02-01 NOTE — Patient Instructions (Signed)
Nice to see you. We will get lab work today. Please try wearing her brace, using ice, and using Aleve over-the-counter 1 tablet twice daily by mouth with food for 5 days.  If this irritates her stomach please discontinue it.  If this issue is not improving please let me know and I can refer you to sports medicine.

## 2021-02-01 NOTE — Assessment & Plan Note (Signed)
The patient has improved a fair bit though continues to have some occasional discomfort particularly when she is more active.  I discussed the option of going to see sports medicine versus more conservative management with wearing the brace again, icing, and a 4 to 5-day course of over-the-counter Aleve twice daily with food.  She opted for the conservative management.  If this is not helping she will let us know and we can refer to sports medicine.

## 2021-02-01 NOTE — Assessment & Plan Note (Signed)
Patient with occasional heartburn after eating.  No red flag symptoms.  Discussed she could continue occasional Tums or she could try Pepcid.  I also advised that she could try a 14-day course of Prilosec over-the-counter and see if that would reduce this long-term.  If she has any worsening symptoms or red flag symptoms she will let us know.

## 2021-02-01 NOTE — Assessment & Plan Note (Signed)
>>  ASSESSMENT AND PLAN FOR HEARTBURN WRITTEN ON 02/01/2021  2:12 PM BY SONNENBERG, ERIC G, MD  Patient with occasional heartburn after eating.  No red flag symptoms.  Discussed she could continue occasional Tums or she could try Pepcid .  I also advised that she could try a 14-day course of Prilosec over-the-counter and see if that would reduce this long-term.  If she has any worsening symptoms or red flag symptoms she will let us  know.

## 2021-02-01 NOTE — Progress Notes (Signed)
Tommi Rumps, MD Phone: [phone]  Anna Kim is a 45 y.o. female who presents today for f/u.  HYPERTENSION Disease Monitoring Home BP Monitoring not checking often though is similar to her reading today Chest pain- no    Dyspnea- no Medications Compliance-  taking amlodipine.  Edema- no BMET    Component Value Date/Time   NA 139 08/01/2020 0850   NA 139 03/22/2015 1123   K 3.2 (L) 08/01/2020 0850   CL 101 08/01/2020 0850   CO2 28 08/01/2020 0850   GLUCOSE 97 08/01/2020 0850   BUN 13 08/01/2020 0850   BUN 9 03/22/2015 1123   CREATININE 0.78 08/01/2020 0850   CREATININE 0.69 11/16/2016 1601   CALCIUM 9.4 08/01/2020 0850   GFRNONAA >60 05/18/2020 0723   GFRAA >60 03/06/2019 1542   HYPERLIPIDEMIA Symptoms Chest pain on exertion:  no  Medications: Compliance- taking pravastatin Right upper quadrant pain- no  Muscle aches- no Lipid Panel     Component Value Date/Time   CHOL 164 05/18/2020 0723   TRIG 91 05/18/2020 0723   HDL 48 05/18/2020 0723   CHOLHDL 3.4 05/18/2020 0723   VLDL 18 05/18/2020 0723   LDLCALC 98 05/18/2020 0723   LDLDIRECT 100.0 06/07/2017 0800   GERD:   Reflux symptoms: occasionally after meals, occurs 1-2x every 2 weeks   Abd pain: no   Blood in stool: no  Dysphagia: no No family history of gastric or esophageal cancer.  Denies weight loss. Medication: occasional tums  Right ankle pain: This started after an injury back in June.  She was seen at urgent care and had x-rays.  They advised this was a bad ankle sprain.  She still has some discomfort laterally on the right ankle particularly after she has gone to the gym and been on her feet all day at work.  She still wears the splint to go to the gym.  Notes it is an aching sensation.   Social History   Tobacco Use  Smoking Status Never  Smokeless Tobacco Never    Current Outpatient Medications on File Prior to Visit  Medication Sig Dispense Refill   amLODipine (NORVASC) 5 MG  tablet Take 1 tablet (5 mg total) by mouth daily. 90 tablet 2   cetirizine (ZYRTEC) 5 MG tablet Take by mouth.     Cholecalciferol (VITAMIN D) 125 MCG (5000 UT) CAPS Take by mouth.     fluticasone (FLONASE) 50 MCG/ACT nasal spray Place 2 sprays into both nostrils daily. 16 g 1   ketoconazole (NIZORAL) 2 % cream APPLY TO THE AFFECTED AREA(S) 2 TIMES A DAY 60 g 3   Norethindrone Acetate-Ethinyl Estradiol (LOESTRIN) 1.5-30 MG-MCG tablet TAKE 1 TABLET BY MOUTH DAILY. 21 tablet 11   pravastatin (PRAVACHOL) 40 MG tablet TAKE 1 TABLET BY MOUTH DAILY. 90 tablet 3   RHOFADE 1 % CREA APPLY ONE PEA SIZE AMOUNT ON EACH CHEEK AND SPREAD TO ALL AFFECTED AREAS     zinc gluconate 50 MG tablet Take 50 mg by mouth daily.     [DISCONTINUED] Calcium Carb-Cholecalciferol (CALCIUM + D3 PO) Take 1 tablet by mouth daily.     No current facility-administered medications on file prior to visit.     ROS see history of present illness  Objective  Physical Exam Vitals:   02/01/21 1354  BP: 118/78  Pulse: 74  Temp: 98.2 F (36.8 C)  SpO2: 98%    BP Readings from Last 3 Encounters:  02/01/21 118/78  09/14/20 132/83  08/31/20 122/68   Wt Readings from Last 3 Encounters:  02/01/21 178 lb 12.8 oz (81.1 kg)  08/01/20 175 lb 12.8 oz (79.7 kg)  04/25/20 175 lb 6 oz (79.5 kg)    Physical Exam Constitutional:      General: She is not in acute distress.    Appearance: She is not diaphoretic.  Cardiovascular:     Rate and Rhythm: Normal rate and regular rhythm.     Heart sounds: Normal heart sounds.  Pulmonary:     Effort: Pulmonary effort is normal.     Breath sounds: Normal breath sounds.  Musculoskeletal:     Comments: Right ankle with no malleoli or tenderness, no fifth metatarsal tenderness, no navicular tenderness, there is soft tissue swelling over the right ankle inferior to the malleolus, slight discomfort on passive inversion of the right ankle  Skin:    General: Skin is warm and dry.   Neurological:     Mental Status: She is alert.     Assessment/Plan: Please see individual problem list.  Problem List Items Addressed This Visit     Heartburn    Patient with occasional heartburn after eating.  No red flag symptoms.  Discussed she could continue occasional Tums or she could try Pepcid.  I also advised that she could try a 14-day course of Prilosec over-the-counter and see if that would reduce this long-term.  If she has any worsening symptoms or red flag symptoms she will let us know.      Hyperlipidemia    Continue pravastatin 40 mg once daily.      Hypertension    Well-controlled.  She will continue amlodipine 5 mg once daily.  We will check a BMP given her prior history of low potassium.      Relevant Orders   Basic Metabolic Panel (BMET)   Right ankle sprain    The patient has improved a fair bit though continues to have some occasional discomfort particularly when she is more active.  I discussed the option of going to see sports medicine versus more conservative management with wearing the brace again, icing, and a 4 to 5-day course of over-the-counter Aleve twice daily with food.  She opted for the conservative management.  If this is not helping she will let us know and we can refer to sports medicine.        Return in about 6 months (around 08/01/2021).  This visit occurred during the SARS-CoV-2 public health emergency.  Safety protocols were in place, including screening questions prior to the visit, additional usage of staff PPE, and extensive cleaning of exam room while observing appropriate contact time as indicated for disinfecting solutions.    Tommi Rumps, MD [place]

## 2021-02-01 NOTE — Assessment & Plan Note (Signed)
Continue pravastatin 40 mg once daily.

## 2021-02-02 LAB — BASIC METABOLIC PANEL
BUN: 10 mg/dL (ref 6–23)
CO2: 28 mEq/L (ref 19–32)
Calcium: 9.1 mg/dL (ref 8.4–10.5)
Chloride: 103 mEq/L (ref 96–112)
Creatinine, Ser: 0.67 mg/dL (ref 0.40–1.20)
GFR: 104.41 mL/min (ref >60.00)
Glucose, Bld: 93 mg/dL (ref 70–99)
Potassium: 3.6 mEq/L (ref 3.5–5.1)
Sodium: 140 mEq/L (ref 135–145)

## 2021-02-20 ENCOUNTER — Encounter: Payer: Self-pay | Admitting: Family Medicine

## 2021-02-20 DIAGNOSIS — M25571 Pain in right ankle and joints of right foot: Secondary | ICD-10-CM

## 2021-03-28 ENCOUNTER — Other Ambulatory Visit: Payer: Self-pay

## 2021-03-28 MED FILL — Norethindrone Ace & Ethinyl Estradiol Tab 1.5 MG-30 MCG: ORAL | 42 days supply | Qty: 42 | Fill #2 | Status: AC

## 2021-04-23 ENCOUNTER — Other Ambulatory Visit: Payer: Self-pay | Admitting: Family Medicine

## 2021-04-23 DIAGNOSIS — E785 Hyperlipidemia, unspecified: Secondary | ICD-10-CM

## 2021-04-24 ENCOUNTER — Other Ambulatory Visit: Payer: Self-pay

## 2021-04-25 ENCOUNTER — Other Ambulatory Visit: Payer: Self-pay | Admitting: Family Medicine

## 2021-04-25 ENCOUNTER — Other Ambulatory Visit: Payer: Self-pay

## 2021-04-25 DIAGNOSIS — E785 Hyperlipidemia, unspecified: Secondary | ICD-10-CM

## 2021-04-25 MED FILL — Pravastatin Sodium Tab 40 MG: ORAL | 90 days supply | Qty: 90 | Fill #0 | Status: AC

## 2021-05-01 ENCOUNTER — Encounter: Payer: Self-pay | Admitting: Certified Nurse Midwife

## 2021-05-01 ENCOUNTER — Other Ambulatory Visit: Payer: Self-pay

## 2021-05-01 ENCOUNTER — Ambulatory Visit (INDEPENDENT_AMBULATORY_CARE_PROVIDER_SITE_OTHER): Payer: No Typology Code available for payment source | Admitting: Certified Nurse Midwife

## 2021-05-01 VITALS — BP 131/81 | HR 71 | Ht 66.0 in | Wt 178.2 lb

## 2021-05-01 DIAGNOSIS — Z01419 Encounter for gynecological examination (general) (routine) without abnormal findings: Secondary | ICD-10-CM | POA: Diagnosis not present

## 2021-05-01 DIAGNOSIS — Z1212 Encounter for screening for malignant neoplasm of rectum: Secondary | ICD-10-CM

## 2021-05-01 DIAGNOSIS — N3941 Urge incontinence: Secondary | ICD-10-CM | POA: Diagnosis not present

## 2021-05-01 DIAGNOSIS — Z1231 Encounter for screening mammogram for malignant neoplasm of breast: Secondary | ICD-10-CM

## 2021-05-01 NOTE — Patient Instructions (Signed)

## 2021-05-01 NOTE — Progress Notes (Signed)
GYNECOLOGY ANNUAL PREVENTATIVE CARE ENCOUNTER NOTE  History:     Anna Kim is a 46 y.o. G37P1001 female here for a routine annual gynecologic exam.  Current complaints: urinary incontinence, state several yrs ago she saw pelvic floor PT that helped with this issue. Is request referral .   Denies abnormal vaginal bleeding, discharge, pelvic pain, problems with intercourse or other gynecologic concerns.     Social Relationship: Divorced Living: daughter Work: Therapist, music. [employer] Exercise: 3 x wk (getting back to it had ankle sprain recently) Smoke/Alcohol/drug use: rare alcohol use. Denies smoking and drug use.  Gynecologic History No LMP recorded (lmp unknown). (Menstrual status: Oral contraceptives).OCP (estrogen/progesterone) Contraception: OCP (estrogen/progesterone) Last Pap: 04/25/2020. Results were: normal with negative HPV Last mammogram: 08/2020. Results were: normal   Upstream - 05/01/21 0956       Pregnancy Intention Screening   Does the patient want to become pregnant in the next year? No    Does the patient's partner want to become pregnant in the next year? No            The pregnancy intention screening data noted above was reviewed. Potential methods of contraception were discussed. The patient elected to proceed with OCP.  Obstetric History OB History  Gravida Para Term Preterm AB Living  1 1 1     1   SAB IAB Ectopic Multiple Live Births          1    # Outcome Date GA Lbr Len/2nd Weight Sex Delivery Anes PTL Lv  1 Term 09/23/04 [redacted]w[redacted]d  8 lb (3.629 kg) F Vag-Spont  N LIV    Past Medical History:  Diagnosis Date   History of hypertension 01/20/2016   HLD (hyperlipidemia)    HTN (hypertension)    Migraine    Ureteral stone    Vaginal Pap smear, abnormal    Remote history of 1 abnormal pap    Past Surgical History:  Procedure Laterality Date   TONSILLECTOMY  2011    Current Outpatient Medications on File Prior to Visit  Medication  Sig Dispense Refill   amLODipine (NORVASC) 5 MG tablet Take 1 tablet (5 mg total) by mouth daily. 90 tablet 2   cetirizine (ZYRTEC) 5 MG tablet Take by mouth.     Cholecalciferol (VITAMIN D) 125 MCG (5000 UT) CAPS Take by mouth.     fluticasone (FLONASE) 50 MCG/ACT nasal spray Place 2 sprays into both nostrils daily. 16 g 1   ketoconazole (NIZORAL) 2 % cream APPLY TO THE AFFECTED AREA(S) 2 TIMES A DAY 60 g 3   Norethindrone Acetate-Ethinyl Estradiol (LOESTRIN) 1.5-30 MG-MCG tablet TAKE 1 TABLET BY MOUTH DAILY. 21 tablet 11   pravastatin (PRAVACHOL) 40 MG tablet TAKE 1 TABLET BY MOUTH DAILY. 90 tablet 3   RHOFADE 1 % CREA APPLY ONE PEA SIZE AMOUNT ON EACH CHEEK AND SPREAD TO ALL AFFECTED AREAS     zinc gluconate 50 MG tablet Take 50 mg by mouth daily.     [DISCONTINUED] Calcium Carb-Cholecalciferol (CALCIUM + D3 PO) Take 1 tablet by mouth daily.     No current facility-administered medications on file prior to visit.    Allergies  Allergen Reactions   Ceftriaxone Hives   Erythromycin Other (See Comments)    GI Upset   Sulfa Antibiotics Rash    Social History:  reports that she has never smoked. She has never used smokeless tobacco. She reports current alcohol use. She reports that she does not  use drugs.  Family History  Problem Relation Age of Onset   Cancer Maternal Aunt        ovarian   Hypertension Mother    Hypertension Father    Atrial fibrillation Father    Kidney cancer Neg Hx    Bladder Cancer Neg Hx    Breast cancer Neg Hx     The following portions of the patient's history were reviewed and updated as appropriate: allergies, current medications, past family history, past medical history, past social history, past surgical history and problem list.  Review of Systems Pertinent items noted in HPI and remainder of comprehensive ROS otherwise negative.  Physical Exam:  BP 131/81    Pulse 71    Ht 5\' 6"  (1.676 m)    Wt 178 lb 3.2 oz (80.8 kg)    LMP  (LMP Unknown)     BMI 28.76 kg/m  CONSTITUTIONAL: Well-developed, well-nourished female in no acute distress.  HENT:  Normocephalic, atraumatic, External right and left ear normal. Oropharynx is clear and moist EYES: Conjunctivae and EOM are normal. Pupils are equal, round, and reactive to light. No scleral icterus.  NECK: Normal range of motion, supple, no masses.  Normal thyroid.  SKIN: Skin is warm and dry. No rash noted. Not diaphoretic. No erythema. No pallor. MUSCULOSKELETAL: Normal range of motion. No tenderness.  No cyanosis, clubbing, or edema.  2+ distal pulses. NEUROLOGIC: Alert and oriented to person, place, and time. Normal reflexes, muscle tone coordination.  PSYCHIATRIC: Normal mood and affect. Normal behavior. Normal judgment and thought content. CARDIOVASCULAR: Normal heart rate noted, regular rhythm RESPIRATORY: Clear to auscultation bilaterally. Effort and breath sounds normal, no problems with respiration noted. BREASTS: Symmetric in size. No masses, tenderness, skin changes, nipple drainage, or lymphadenopathy bilaterally.  ABDOMEN: Soft, no distention noted.  No tenderness, rebound or guarding.  PELVIC: Normal appearing external genitalia and urethral meatus; normal appearing vaginal mucosa and cervix.  No abnormal discharge noted.  Pap smear not due.  Normal uterine size, no other palpable masses, no uterine or adnexal tenderness.  .   Assessment and Plan:    1. Well woman exam with routine gynecological exam    Pap: not done Mammogram : ordered Labs: none Refills: ocp Referral:Gastro for colonoscopy, PT-pelvic floor therapy Routine preventative health maintenance measures emphasized. Please refer to After Visit Summary for other counseling recommendations.      Philip Aspen, CNM [facility]

## 2021-05-02 ENCOUNTER — Telehealth: Payer: Self-pay

## 2021-05-02 NOTE — Telephone Encounter (Signed)
CALLED PATIENT NO ANSWER LEFT VOICEMAIL FOR A CALL BACK ? ?

## 2021-05-04 ENCOUNTER — Other Ambulatory Visit: Payer: Self-pay | Admitting: Family Medicine

## 2021-05-04 DIAGNOSIS — I10 Essential (primary) hypertension: Secondary | ICD-10-CM

## 2021-05-05 ENCOUNTER — Other Ambulatory Visit: Payer: Self-pay

## 2021-05-05 ENCOUNTER — Telehealth: Payer: Self-pay

## 2021-05-05 MED ORDER — AMLODIPINE BESYLATE 5 MG PO TABS
5.0000 mg | ORAL_TABLET | Freq: Every day | ORAL | 2 refills | Status: DC
  Filled 2021-05-05: qty 90, 90d supply, fill #0
  Filled 2021-07-27: qty 90, 90d supply, fill #1
  Filled 2021-10-27: qty 90, 90d supply, fill #2

## 2021-05-05 NOTE — Telephone Encounter (Signed)
CALLED PATIENT NO ANSWER LEFT VOICEMAIL FOR A CALL BACK °Letter sent °

## 2021-05-23 ENCOUNTER — Other Ambulatory Visit: Payer: Self-pay

## 2021-05-23 ENCOUNTER — Ambulatory Visit: Payer: No Typology Code available for payment source | Attending: Certified Nurse Midwife | Admitting: Physical Therapy

## 2021-05-23 ENCOUNTER — Encounter: Payer: Self-pay | Admitting: Physical Therapy

## 2021-05-23 DIAGNOSIS — R278 Other lack of coordination: Secondary | ICD-10-CM | POA: Diagnosis present

## 2021-05-23 DIAGNOSIS — N3941 Urge incontinence: Secondary | ICD-10-CM | POA: Diagnosis not present

## 2021-05-23 DIAGNOSIS — M6281 Muscle weakness (generalized): Secondary | ICD-10-CM | POA: Insufficient documentation

## 2021-05-23 NOTE — Therapy (Signed)
[facility] [address]. [place], [place], [address] Phone: [phone]   Fax:  [phone]  Physical Therapy Evaluation  Patient Details  Name: Anna Kim MRN: [id number] Date of Birth: Nov 24, 1975 Referring Provider (PT): Philip Aspen   Encounter Date: 05/23/2021   PT End of Session - 05/23/21 1329     Visit Number 1    Number of Visits 12    Date for PT Re-Evaluation 08/15/21    Authorization Type IE 05/23/2021    PT Start Time 1330    PT Stop Time 1410    PT Time Calculation (min) 40 min    Activity Tolerance Patient tolerated treatment well    Behavior During Therapy [facility] for tasks assessed/performed             Past Medical History:  Diagnosis Date   History of hypertension 01/20/2016   HLD (hyperlipidemia)    HTN (hypertension)    Migraine    Ureteral stone    Vaginal Pap smear, abnormal    Remote history of 1 abnormal pap    Past Surgical History:  Procedure Laterality Date   TONSILLECTOMY  2011    There were no vitals filed for this visit.        [facility] PT Assessment - 05/23/21 1327       Assessment   Medical Diagnosis UI    Referring Provider (PT) Philip Aspen    Hand Dominance Right    Prior Therapy Yes      Balance Screen   Has the patient fallen in the past 6 months No             PELVIC HEALTH PHYSICAL THERAPY EVALUATION  SCREENING Red Flags: None Have you had any night sweats? Unexplained weight loss? Saddle anesthesia? Unexplained changes in bowel or bladder habits?  Precautions: None  SUBJECTIVE  Chief Complaint: Patient notes that since childbirth 16 years ago she has had problems with UI. Patient had been instructed by previous providers to do kegels which she found ineffective. Patient has participated in PFPT previously had helped. Patient notes that after d/c she forgot to do the exercises as regularly and had a recurrence of UI.   Pertinent History:   Falls Negative.  Scoliosis Negative. Pulmonary disease/dysfunction Negative. Surgical history: Positive for see above.   Obstetrical History: G1P1 Deliveries: SVD Tearing/Episiotomy: grade 3  Gynecological History: Hysterectomy: No Vaginal/Abdominal Endometriosis: Negative Pelvic Organ Prolapse: Negative Last Menstrual Period: absent with BC Pain with exam: No Heaviness/pressure: No    Urinary History: Incontinence: Positive. Onset: childbirth Triggers: sneezing (100% with a full bladder), coughing (100%), laughing (25%), stepping wide suddenly, stepping/running fast (first couple steps) Amount: Min/Mod. Protective undergarments: Yes  Type: light day pad  Number used/day: 1x; Patient wears a thicker one when physically active.  Fluid Intake: ~64 oz H20, 1-2x sodas Nocturia: 0-1x/night Frequency of urination: every 2 hours Pain with urination: Negative Difficulty initiating urination: Negative Intermittent stream: Negative Frequent UTI: Negative.   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  Recent memory is intact.  Remote memory is intact.  Attention span and concentration are intact.  Expressive speech is intact.  Patient's fund of knowledge is within normal limits for educational level.  POSTURE/OBSERVATIONS:  Lumbar lordosis: WNL Thoracic kyphosis: WNL Iliac crest height: equal bilaterally Pelvic obliquity: minimal L anterior rotation   GAIT: Grossly WNL  RANGE OF MOTION: deferred 2/2 to time constraints   LEFT RIGHT   Lumbar forward flexion (65):      Lumbar extension (30):     Lumbar lateral flexion (25):     Thoracic and Lumbar rotation (30 degrees):       Hip Flexion (0-125):      Hip IR (0-45):     Hip ER (0-45):     Hip Abduction (0-40):     Hip extension (0-15):       SENSATION: deferred 2/2 to time constraints  STRENGTH: MMT deferred 2/2 to time constraints  RLE LLE  Hip Flexion    Hip Extension    Hip Abduction     Hip Adduction     Hip ER     Hip IR     Knee Extension    Knee Flexion    Dorsiflexion     Plantarflexion (seated)     ABDOMINAL:  Palpation: no TTP Diastasis: no distinct laxity noted in linea alba, rectus dominant strategy with emerging coning/doming superior to the umbilicus Rib flare: none noted  SPECIAL TESTS: deferred 2/2 to time constraints   PHYSICAL PERFORMANCE MEASURES: STS: WNL  EXTERNAL PELVIC EXAM: Patient educated on the purpose of the pelvic exam and articulated understanding; patient consented to the exam verbally. Breath coordination: present Voluntary Contraction: present; 2/5 MMT, x5 reps  Relaxation: full Perineal movement with sustained IAP increase ("bear down"): descent Perineal movement with rapid IAP increase ("cough"): descent  INTERNAL VAGINAL EXAM: deferred 2/2 to time constraints Introitus Appears:  Skin integrity:  Scar mobility: Strength (PERF):  Symmetry: Palpation: Prolapse:   INTERNAL RECTAL EXAM: not indicated Strength (PERF): Symmetry: Palpation: Prolapse:   OUTCOME MEASURES: FOTO (Urinary Problem 52)   ASSESSMENT Patient is a 46 year old presenting to clinic with chief complaints of stress urinary incontinence. Upon examination, patient demonstrates deficits in IAP management, PFM strength, and PFM coordination as evidenced by rectus dominant abdominal strategy with emerging coning/doming, paradoxical PFM activity with cued cough, 2/5 MMT of PFM with ability to perform 5 repetitions in supine, urinary  leakage with running/sprinting/ cutting 100% of the time. Patient's responses on FOTO Urinary Problem outcome measures (52) indicate moderate functional limitations/disability/distress. Patient's progress may be limited due to persistence of complaint; however, patient's motivation and previous success with PFPT are advantageous. Patient was able to achieve basic understanding of HEP including diaphragmatic breathing and PFM contractions during today's evaluation and responded positively to active interventions. Patient will benefit from continued skilled therapeutic intervention to address deficits in IAP management, PFM strength, and PFM coordination in order to increase function and improve overall QOL.  EDUCATION Patient educated on prognosis, POC, and provided with HEP including: [id number]. Patient articulated understanding and returned demonstration. Patient will benefit from further education in order to maximize compliance and understanding for long-term therapeutic gains.   Objective measurements completed on examination: See above findings.          PT Long Term Goals - 05/23/21 1429       PT LONG TERM GOAL #1   Title Patient will demonstrate independence with HEP in order to maximize therapeutic gains and improve carryover from physical  therapy sessions to ADLs in the home and community.    Baseline IE: provided    Time 12    Period Weeks    Status New    Target Date 08/15/21      PT LONG TERM GOAL #2   Title Patient will demonstrate circumferential and sequential contraction of >4/5 MMT, > 6 sec hold x10 and 5 consecutive quick flicks with </= 10 min rest between testing bouts, and relaxation of the PFM coordinated with breath for improved management of intra-abdominal pressure and normal bowel and bladder function without the presence of pain nor incontinence in order to improve participation at home and in the community.    Baseline IE: 2/5, 5x , endurane not tested    Time  12    Period Weeks    Status New    Target Date 08/15/21      PT LONG TERM GOAL #3   Title Patient will report less than 2 incidents of stress urinary incontinence over the course of 3 weeks while coughing/sneezing/laughing/running/cutting activity in order to demonstrate improved PFM coordination, strength, and function for improved overall QOL.    Baseline IE: 100% of the time with cutting/sprinting    Time 12    Period Weeks    Status New    Target Date 08/15/21      PT LONG TERM GOAL #4   Title Patient will report confidence in ability to control bladder > 8/10 in order to demonstrate improved function and ability to participate more fully in activities at home and in the community.    Baseline IE: 5/10    Time 12    Period Weeks    Status New    Target Date 08/15/21      PT LONG TERM GOAL #5   Title Patient will demonstrate improved function as evidenced by a score of 63 on FOTO measure for full participation in activities at home and in the community.    Baseline IE: 54    Time 12    Period Weeks    Status New    Target Date 08/15/21                    Plan - 05/23/21 1330     Clinical Impression Statement Patient is a 46 year old presenting to clinic with chief complaints of stress urinary incontinence. Upon examination, patient demonstrates deficits in IAP management, PFM strength, and PFM coordination as evidenced by rectus dominant abdominal strategy with emerging coning/doming, paradoxical PFM activity with cued cough, 2/5 MMT of PFM with ability to perform 5 repetitions in supine, urinary leakage with running/sprinting/ cutting 100% of the time. Patient's responses on FOTO Urinary Problem outcome measures (52) indicate moderate functional limitations/disability/distress. Patient's progress may be limited due to persistence of complaint; however, patient's motivation and previous success with PFPT are advantageous. Patient was able to achieve basic understanding  of HEP including diaphragmatic breathing and PFM contractions during today's evaluation and responded positively to active interventions. Patient will benefit from continued skilled therapeutic intervention to address deficits in IAP management, PFM strength, and PFM coordination in order to increase function and improve overall QOL.    Personal Factors and Comorbidities Behavior Pattern;Comorbidity 3+;Past/Current Experience;Time since onset of injury/illness/exacerbation    Comorbidities HLD, HTN, migraine, prediabetes    Examination-Activity Limitations Continence;Lift;Squat;Locomotion Level    Examination-Participation Restrictions Community Activity;Other    Stability/Clinical Decision Making Evolving/Moderate complexity    Clinical Decision Making Moderate  Rehab Potential Good    PT Frequency 1x / week    PT Duration 12 weeks    PT Treatment/Interventions ADLs/Self Care Home Management;Cryotherapy;Electrical Stimulation;Moist Heat;Therapeutic activities;Therapeutic exercise;Neuromuscular re-education;Patient/family education;Orthotic Fit/Training;Manual techniques;Scar mobilization;Taping    PT Next Visit Plan deep core training    PT Home Exercise Plan [id number]    Consulted and Agree with Plan of Care Patient             Patient will benefit from skilled therapeutic intervention in order to improve the following deficits and impairments:  Improper body mechanics, Decreased coordination, Decreased strength, Decreased activity tolerance, Decreased endurance  Visit Diagnosis: No diagnosis found.     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 [place], [place], [address] Phone: [phone]   Fax:  [phone]  Name: Anna Kim MRN: [id number] Date of Birth: [birth year]

## 2021-05-24 ENCOUNTER — Other Ambulatory Visit: Payer: Self-pay | Admitting: Certified Nurse Midwife

## 2021-05-25 ENCOUNTER — Other Ambulatory Visit: Payer: Self-pay

## 2021-05-25 MED ORDER — NORETHINDRONE ACET-ETHINYL EST 1.5-30 MG-MCG PO TABS
1.0000 | ORAL_TABLET | Freq: Every day | ORAL | 11 refills | Status: DC
  Filled 2021-05-25: qty 84, 84d supply, fill #0
  Filled 2021-09-02: qty 84, 84d supply, fill #1
  Filled 2021-11-28: qty 84, 84d supply, fill #2

## 2021-06-06 ENCOUNTER — Other Ambulatory Visit: Payer: Self-pay

## 2021-06-06 ENCOUNTER — Ambulatory Visit: Payer: No Typology Code available for payment source | Attending: Certified Nurse Midwife | Admitting: Physical Therapy

## 2021-06-06 ENCOUNTER — Encounter: Payer: Self-pay | Admitting: Physical Therapy

## 2021-06-06 DIAGNOSIS — M6281 Muscle weakness (generalized): Secondary | ICD-10-CM | POA: Diagnosis present

## 2021-06-06 DIAGNOSIS — R278 Other lack of coordination: Secondary | ICD-10-CM

## 2021-06-06 NOTE — Therapy (Signed)
[place] ?[facility] ?[address] [place], [place], [address] ?Phone: [phone]   Fax:  [phone] ? ?Physical Therapy Treatment ? ?Patient Details  ?Name: Anna Kim ?MRN: [id number] ?Date of Birth: [birth year] ?Referring Provider (PT): Philip Aspen ? ? ?Encounter Date: 06/06/2021 ? ? PT End of Session - 06/06/21 1420   ? ? Visit Number 2   ? Number of Visits 12   ? Date for PT Re-Evaluation 08/15/21   ? Authorization Type IE 05/23/2021   ? PT Start Time 1420   ? PT Stop Time 1500   ? PT Time Calculation (min) 40 min   ? Activity Tolerance Patient tolerated treatment well   ? Behavior During Therapy [facility] for tasks assessed/performed   ? ?  ?  ? ?  ? ? ?Past Medical History:  ?Diagnosis Date  ? History of hypertension 01/20/2016  ? HLD (hyperlipidemia)   ? HTN (hypertension)   ? Migraine   ? Ureteral stone   ? Vaginal Pap smear, abnormal   ? Remote history of 1 abnormal pap  ? ? ?Past Surgical History:  ?Procedure Laterality Date  ? TONSILLECTOMY  2011  ? ? ?There were no vitals filed for this visit. ? ? Subjective Assessment - 06/06/21 1419   ? ? Subjective Patient denies any significant changes since initial evaluation. Patient has been doing exercises AM and PM. Patient has not had any improvement in UI as of yet.   ? Currently in Pain? No/denies   ? ?  ?  ? ?  ? ?TREATMENT ? ?Pre-treatment assessment: ? ?Manual Therapy: ? ? ?Neuromuscular Re-education: ?Supine hooklying diaphragmatic breathing with VCs and TCs for downregulation of the nervous system and improved management of IAP ?Supine hooklying, TrA activation with exhalation. VCs and TCs to decrease compensatory patterns and minimize aggravation of the lumbar paraspinals. ?Countertop TrA activation against gravity with coordinated breath for improved mm recruitment and postural endurance/stability ?Standing Pilates Postural Control ?Hug a Tree, RTB   ?Serve a Tray, RTB ?Standing pallof press with RTB for  improved postural stabilization ?Standing lateral walk out with pallof press (RTB) for improved postural stabilization ?Supine hooklying, PFM contractions (endurance 8 sec x6) with exhalation. VCs and TCs to decrease compensatory patterns and encourage activation of the PFM. ? ? ?Therapeutic Exercise: ? ? ?Treatments unbilled: ? ?Post-treatment assessment: ? ?Patient educated throughout session on appropriate technique and form using multi-modal cueing, HEP, and activity modification. Patient articulated understanding and returned demonstration. ? ?Patient Response to interventions: ?Comfortable to trial supine TrA ? ?ASSESSMENT ?Patient presents to clinic with excellent motivation to participate in therapy. Patient demonstrates deficits in IAP management, PFM strength, and PFM coordination. Patient able to achieve TrA most consistently in supine and modified quadruped postures during today's session and responded positively to active interventions. Patient will benefit from continued skilled therapeutic intervention to address remaining deficits in IAP management, PFM strength, and PFM coordination in order to increase function and improve overall QOL. ? ? ? PT Long Term Goals - 05/23/21 1429   ? ?  ? PT LONG TERM GOAL #1  ? Title Patient will demonstrate independence with HEP in order to maximize therapeutic gains and improve carryover from physical therapy sessions to ADLs in the home and community.   ? Baseline IE: provided   ? Time 12   ? Period Weeks   ? Status New   ? Target Date 08/15/21   ?  ? PT LONG TERM GOAL #  2  ? Title Patient will demonstrate circumferential and sequential contraction of >4/5 MMT, > 6 sec hold x10 and 5 consecutive quick flicks with </= 10 min rest between testing bouts, and relaxation of the PFM coordinated with breath for improved management of intra-abdominal pressure and normal bowel and bladder function without the presence of pain nor incontinence in order to improve  participation at home and in the community.   ? Baseline IE: 2/5, 5x , endurane not tested   ? Time 12   ? Period Weeks   ? Status New   ? Target Date 08/15/21   ?  ? PT LONG TERM GOAL #3  ? Title Patient will report less than 2 incidents of stress urinary incontinence over the course of 3 weeks while coughing/sneezing/laughing/running/cutting activity in order to demonstrate improved PFM coordination, strength, and function for improved overall QOL.   ? Baseline IE: 100% of the time with cutting/sprinting   ? Time 12   ? Period Weeks   ? Status New   ? Target Date 08/15/21   ?  ? PT LONG TERM GOAL #4  ? Title Patient will report confidence in ability to control bladder > 8/10 in order to demonstrate improved function and ability to participate more fully in activities at home and in the community.   ? Baseline IE: 5/10   ? Time 12   ? Period Weeks   ? Status New   ? Target Date 08/15/21   ?  ? PT LONG TERM GOAL #5  ? Title Patient will demonstrate improved function as evidenced by a score of 63 on FOTO measure for full participation in activities at home and in the community.   ? Baseline IE: 19   ? Time 12   ? Period Weeks   ? Status New   ? Target Date 08/15/21   ? ?  ?  ? ?  ? ? ? ? ? ? ? ? Plan - 06/06/21 1420   ? ? Clinical Impression Statement Patient presents to clinic with excellent motivation to participate in therapy. Patient demonstrates deficits in IAP management, PFM strength, and PFM coordination. Patient able to achieve TrA most consistently in supine and modified quadruped postures during today's session and responded positively to active interventions. Patient will benefit from continued skilled therapeutic intervention to address remaining deficits in IAP management, PFM strength, and PFM coordination in order to increase function and improve overall QOL.   ? Personal Factors and Comorbidities Behavior Pattern;Comorbidity 3+;Past/Current Experience;Time since onset of injury/illness/exacerbation    ? Comorbidities HLD, HTN, migraine, prediabetes   ? Examination-Activity Limitations Continence;Lift;Squat;Locomotion Level   ? Examination-Participation Restrictions Community Activity;Other   ? Stability/Clinical Decision Making Evolving/Moderate complexity   ? Rehab Potential Good   ? PT Frequency 1x / week   ? PT Duration 12 weeks   ? PT Treatment/Interventions ADLs/Self Care Home Management;Cryotherapy;Electrical Stimulation;Moist Heat;Therapeutic activities;Therapeutic exercise;Neuromuscular re-education;Patient/family education;Orthotic Fit/Training;Manual techniques;Scar mobilization;Taping   ? PT Next Visit Plan deep core training   ? PT Home Exercise Plan [email or web address]   ? Consulted and Agree with Plan of Care Patient   ? ?  ?  ? ?  ? ? ?Patient will benefit from skilled therapeutic intervention in order to improve the following deficits and impairments:  Improper body mechanics, Decreased coordination, Decreased strength, Decreased activity tolerance, Decreased endurance ? ?Visit Diagnosis: ?Muscle weakness (generalized) ? ?Other lack of coordination ? ? ? ? ?Problem List ?Patient Active Problem List  ? Diagnosis  Date Noted  ? Heartburn 02/01/2021  ? Right ankle sprain 02/01/2021  ? Joint pain 08/01/2020  ? Need for hepatitis C screening test 06/04/2020  ? Neck pain 12/15/2019  ? Overweight 07/20/2019  ? Hypertension 03/11/2019  ? Vitamin D deficiency 03/11/2019  ? Allergic rhinitis 07/30/2018  ? Left foot pain 03/04/2018  ? Skin exam, screening for cancer 03/04/2018  ? 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  ? Prediabetes 01/20/2016  ? Hyperlipidemia 01/20/2016  ? ? ?Myles Gip PT, DPT [phone]  ?06/07/2021, 9:20 AM ? ?[place] ?[facility] ?[address] [place], [place], [address] ?Phone: [phone]   Fax:  [phone] ? ?Name: Anna Kim ?MRN: [id number] ?Date of Birth: [birth year] ? ? ? ?

## 2021-06-19 ENCOUNTER — Ambulatory Visit: Payer: No Typology Code available for payment source | Admitting: Physical Therapy

## 2021-06-19 ENCOUNTER — Encounter: Payer: Self-pay | Admitting: Physical Therapy

## 2021-06-19 ENCOUNTER — Other Ambulatory Visit: Payer: Self-pay

## 2021-06-19 DIAGNOSIS — M6281 Muscle weakness (generalized): Secondary | ICD-10-CM | POA: Diagnosis not present

## 2021-06-19 DIAGNOSIS — R278 Other lack of coordination: Secondary | ICD-10-CM

## 2021-06-19 NOTE — Therapy (Signed)
?OUTPATIENT PHYSICAL THERAPY TREATMENT NOTE ? ? ?Patient Name: Anna Kim ?MRN: [id number] ?DOB:07-Jun-1975, 46 y.o., female ?Today's Date: 06/19/2021 ? ?PCP: Leone Haven, MD ?REFERRING PROVIDER: Philip Aspen, CNM ? ? PT End of Session - 06/19/21 1456   ? ? Visit Number 3   ? Number of Visits 12   ? Date for PT Re-Evaluation 08/15/21   ? Authorization Type IE 05/23/2021   ? PT Start Time 1500   ? PT Stop Time 1540   ? PT Time Calculation (min) 40 min   ? Activity Tolerance Patient tolerated treatment well   ? Behavior During Therapy [facility] for tasks assessed/performed   ? ?  ?  ? ?  ? ? ?Past Medical History:  ?Diagnosis Date  ? History of hypertension 01/20/2016  ? HLD (hyperlipidemia)   ? HTN (hypertension)   ? Migraine   ? Ureteral stone   ? Vaginal Pap smear, abnormal   ? Remote history of 1 abnormal pap  ? ?Past Surgical History:  ?Procedure Laterality Date  ? TONSILLECTOMY  2011  ? ?Patient Active Problem List  ? Diagnosis Date Noted  ? Heartburn 02/01/2021  ? Right ankle sprain 02/01/2021  ? Joint pain 08/01/2020  ? Need for hepatitis C screening test 06/04/2020  ? Neck pain 12/15/2019  ? Overweight 07/20/2019  ? Hypertension 03/11/2019  ? Vitamin D deficiency 03/11/2019  ? Allergic rhinitis 07/30/2018  ? Left foot pain 03/04/2018  ? Skin exam, screening for cancer 03/04/2018  ? 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  ? Prediabetes 01/20/2016  ? Hyperlipidemia 01/20/2016  ? ? ?REFERRING DIAG: N39.41 (ICD-10-CM) - Urge incontinence of urine  ? ?THERAPY DIAG:  ?Muscle weakness (generalized) ? ?Other lack of coordination ? ?PERTINENT HISTORY: see above ? ?PRECAUTIONS: none ? ?SUBJECTIVE: Patient denies any significant changes since last session. Patient notes that she was able to work on PFM endurance holds and standing TrA activation with ADLs. Patient was not able to perform standing Pilates interventions because of missing theraband. Patient notes she has not had any "big leakage" but does have slow  dribbling throughout the day.  ? ?PAIN:  ?Are you having pain? No ? ? ? ?TREATMENT ? ?Pre-treatment assessment: ? ?Manual Therapy: ? ? ?Neuromuscular Re-education: ?Sidelying clamshell, GTB, x30 each for improved neural overflow to PFM ?Sidelying reverse clamshell, GTB, x30 each for improved neural overflow to PFM ?Supine bridge with ER, GTB, 2x15 for improved postural strength/control ?RTB lateral walk with feet in parallel, toes in, and toes out performed to fatigue ?RTB hip ER with wall plank position  for improved hip mobility and postural control ?RTB hip airplane with wall plank position for improved hip mobility and postural control ? ?Therapeutic Exercise: ? ? ?Treatments unbilled: ? ?Post-treatment assessment: ? ?Patient educated throughout session on appropriate technique and form using multi-modal cueing, HEP, and activity modification. Patient articulated understanding and returned demonstration. ? ?Patient Response to interventions: ?Does note report increased pain ? ? ? PT Long Term Goals  ? ?  ? PT LONG TERM GOAL #1  ? Title Patient will demonstrate independence with HEP in order to maximize therapeutic gains and improve carryover from physical therapy sessions to ADLs in the home and community.   ? Baseline IE: provided   ? Time 12   ? Period Weeks   ? Status New   ? Target Date 08/15/21   ?  ? PT LONG TERM GOAL #2  ? Title Patient will demonstrate circumferential and sequential contraction of >  4/5 MMT, > 6 sec hold x10 and 5 consecutive quick flicks with </= 10 min rest between testing bouts, and relaxation of the PFM coordinated with breath for improved management of intra-abdominal pressure and normal bowel and bladder function without the presence of pain nor incontinence in order to improve participation at home and in the community.   ? Baseline IE: 2/5, 5x , endurane not tested   ? Time 12   ? Period Weeks   ? Status New   ? Target Date 08/15/21   ?  ? PT LONG TERM GOAL #3  ? Title Patient  will report less than 2 incidents of stress urinary incontinence over the course of 3 weeks while coughing/sneezing/laughing/running/cutting activity in order to demonstrate improved PFM coordination, strength, and function for improved overall QOL.   ? Baseline IE: 100% of the time with cutting/sprinting   ? Time 12   ? Period Weeks   ? Status New   ? Target Date 08/15/21   ?  ? PT LONG TERM GOAL #4  ? Title Patient will report confidence in ability to control bladder > 8/10 in order to demonstrate improved function and ability to participate more fully in activities at home and in the community.   ? Baseline IE: 5/10   ? Time 12   ? Period Weeks   ? Status New   ? Target Date 08/15/21   ?  ? PT LONG TERM GOAL #5  ? Title Patient will demonstrate improved function as evidenced by a score of 63 on FOTO measure for full participation in activities at home and in the community.   ? Baseline IE: 40   ? Time 12   ? Period Weeks   ? Status New   ? Target Date 08/15/21   ? ?  ?  ? ?  ? ? Plan  ? ? Clinical Impression Statement Patient presents to clinic with excellent motivation to participate in therapy. Patient demonstrates deficits in IAP management, PFM strength, and PFM coordination. Patient with good form on all hip control and mobility interventions during today's session and responded positively to active interventions. Patient will benefit from continued skilled therapeutic intervention to address remaining deficits in IAP management, PFM strength, and PFM coordination in order to increase function and improve overall QOL.   ? Personal Factors and Comorbidities Behavior Pattern;Comorbidity 3+;Past/Current Experience;Time since onset of injury/illness/exacerbation   ? Comorbidities HLD, HTN, migraine, prediabetes   ? Examination-Activity Limitations Continence;Lift;Squat;Locomotion Level   ? Examination-Participation Restrictions Community Activity;Other   ? Stability/Clinical Decision Making Evolving/Moderate  complexity   ? Rehab Potential Good   ? PT Frequency 1x / week   ? PT Duration 12 weeks   ? PT Treatment/Interventions ADLs/Self Care Home Management;Cryotherapy;Electrical Stimulation;Moist Heat;Therapeutic activities;Therapeutic exercise;Neuromuscular re-education;Patient/family education;Orthotic Fit/Training;Manual techniques;Scar mobilization;Taping   ? PT Next Visit Plan deep core training   ? PT Home Exercise Plan [email or web address]   ? Consulted and Agree with Plan of Care Patient   ? ?  ?  ? ?  ? ? ? ? ? ? Myles Gip PT, DPT [phone]  ?06/19/2021, 6:13 PM ? ?  ? ?

## 2021-06-21 ENCOUNTER — Ambulatory Visit
Admission: RE | Admit: 2021-06-21 | Discharge: 2021-06-21 | Disposition: A | Discharge status: Home / Self Care | Payer: No Typology Code available for payment source | Source: Ambulatory Visit

## 2021-06-21 VITALS — BP 136/84 | HR 82 | Temp 98.1°F | Resp 18

## 2021-06-21 DIAGNOSIS — J302 Other seasonal allergic rhinitis: Secondary | ICD-10-CM

## 2021-06-21 DIAGNOSIS — J029 Acute pharyngitis, unspecified: Secondary | ICD-10-CM

## 2021-06-21 LAB — POCT RAPID STREP A (OFFICE): Rapid Strep A Screen: NEGATIVE

## 2021-06-21 NOTE — ED Triage Notes (Signed)
Pt presents with ST and drainage since yesterday.  ?

## 2021-06-21 NOTE — ED Provider Notes (Signed)
?[facility] ? ? ? ?CSN: [id number] ?Arrival date & time: 06/21/21  1143 ? ? ?  ? ?History   ?Chief Complaint ?Chief Complaint  ?Patient presents with  ? Sore Throat  ?  Hoarse, sinus drainage/allergies - Entered by patient  ? ? ?HPI ?Anna Kim is a 46 y.o. female.  Patient presents with sore throat and postnasal drip x1 day.  She denies fever, chills, rash, shortness of breath, or other symptoms.  Treatment at home with Zyrtec.  Her medical history includes hypertension, prediabetes, migraine headaches, allergic rhinitis. ? ?The history is provided by the patient and medical records.  ? ?Past Medical History:  ?Diagnosis Date  ? History of hypertension 01/20/2016  ? HLD (hyperlipidemia)   ? HTN (hypertension)   ? Migraine   ? Ureteral stone   ? Vaginal Pap smear, abnormal   ? Remote history of 1 abnormal pap  ? ? ?Patient Active Problem List  ? Diagnosis Date Noted  ? Heartburn 02/01/2021  ? Right ankle sprain 02/01/2021  ? Joint pain 08/01/2020  ? Need for hepatitis C screening test 06/04/2020  ? Neck pain 12/15/2019  ? Overweight 07/20/2019  ? Hypertension 03/11/2019  ? Vitamin D deficiency 03/11/2019  ? Allergic rhinitis 07/30/2018  ? Left foot pain 03/04/2018  ? Skin exam, screening for cancer 03/04/2018  ? 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  ? Prediabetes 01/20/2016  ? Hyperlipidemia 01/20/2016  ? ? ?Past Surgical History:  ?Procedure Laterality Date  ? TONSILLECTOMY  2011  ? ? ?OB History   ? ? Gravida  ?1  ? Para  ?1  ? Term  ?1  ? Preterm  ?   ? AB  ?   ? Living  ?1  ?  ? ? SAB  ?   ? IAB  ?   ? Ectopic  ?   ? Multiple  ?   ? Live Births  ?1  ?   ?  ?  ? ? ? ?Home Medications   ? ?Prior to Admission medications   ?Medication Sig Start Date End Date Taking? Authorizing Provider  ?amLODipine (NORVASC) 5 MG tablet Take 1 tablet (5 mg total) by mouth daily. 05/05/21   Leone Haven, MD  ?cetirizine (ZYRTEC) 5 MG tablet Take by mouth.    [provider]  ?Cholecalciferol 125 MCG (5000  UT) TABS cholecalciferol (vitamin D3) 125 mcg (5,000 unit) tablet    [provider]  ?fluticasone (FLONASE) 50 MCG/ACT nasal spray Place 2 sprays into both nostrils daily. 07/20/19   Leone Haven, MD  ?Norethindrone Acetate-Ethinyl Estradiol (LARIN 1.5/30) 1.5-30 MG-MCG tablet TAKE 1 TABLET BY MOUTH DAILY. 05/25/21 05/25/22  Philip Aspen, CNM  ?pravastatin (PRAVACHOL) 40 MG tablet TAKE 1 TABLET BY MOUTH DAILY. 04/25/21 04/25/22  Leone Haven, MD  ?RHOFADE 1 % CREA APPLY ONE PEA SIZE AMOUNT ON EACH CHEEK AND SPREAD TO ALL AFFECTED AREAS 05/16/20   [provider]  ?zinc gluconate 50 MG tablet Take 50 mg by mouth daily.    [provider]  ?Calcium Carb-Cholecalciferol (CALCIUM + D3 PO) Take 1 tablet by mouth daily.  03/06/19  [provider]  ? ? ?Family History ?Family History  ?Problem Relation Age of Onset  ? Cancer Maternal Aunt   ?     ovarian  ? Hypertension Mother   ? Hypertension Father   ? Atrial fibrillation Father   ? Kidney cancer Neg Hx   ? Bladder Cancer Neg Hx   ?  Breast cancer Neg Hx   ? ? ?Social History ?Social History  ? ?Tobacco Use  ? Smoking status: Never  ? Smokeless tobacco: Never  ?Vaping Use  ? Vaping Use: Never used  ?Substance Use Topics  ? Alcohol use: Yes  ?  Comment: occas  ? Drug use: No  ? ? ? ?Allergies   ?Ceftriaxone, Erythromycin, and Sulfa antibiotics ? ? ?Review of Systems ?Review of Systems  ?Constitutional:  Negative for chills and fever.  ?HENT:  Positive for postnasal drip and sore throat. Negative for ear pain.   ?Respiratory:  Negative for cough and shortness of breath.   ?Skin:  Negative for color change and rash.  ?All other systems reviewed and are negative. ? ? ?Physical Exam ?Triage Vital Signs ?ED Triage Vitals  ?Enc Vitals Group  ?   BP 06/21/21 1201 136/84  ?   Pulse Rate 06/21/21 1201 82  ?   Resp 06/21/21 1201 18  ?   Temp 06/21/21 1201 98.1 ?F (36.7 ?C)  ?   Temp src --   ?   SpO2 06/21/21 1201 98 %  ?   Weight --   ?    Height --   ?   Head Circumference --   ?   Peak Flow --   ?   Pain Score 06/21/21 1202 0  ?   Pain Loc --   ?   Pain Edu? --   ?   Excl. in [place]? --   ? ?No data found. ? ?Updated Vital Signs ?BP 136/84   Pulse 82   Temp 98.1 ?F (36.7 ?C)   Resp 18   SpO2 98%  ? ?Visual Acuity ?Right Eye Distance:   ?Left Eye Distance:   ?Bilateral Distance:   ? ?Right Eye Near:   ?Left Eye Near:    ?Bilateral Near:    ? ?Physical Exam ?Vitals and nursing note reviewed.  ?Constitutional:   ?   General: She is not in acute distress. ?   Appearance: She is well-developed. She is not ill-appearing.  ?HENT:  ?   Right Ear: Tympanic membrane normal.  ?   Left Ear: Tympanic membrane normal.  ?   Nose: Nose normal.  ?   Mouth/Throat:  ?   Mouth: Mucous membranes are moist.  ?   Pharynx: Oropharynx is clear.  ?   Comments: Clear postnasal drip. ?Cardiovascular:  ?   Rate and Rhythm: Normal rate and regular rhythm.  ?   Heart sounds: Normal heart sounds.  ?Pulmonary:  ?   Effort: Pulmonary effort is normal. No respiratory distress.  ?   Breath sounds: Normal breath sounds.  ?Musculoskeletal:  ?   Cervical back: Neck supple.  ?Skin: ?   General: Skin is warm and dry.  ?Neurological:  ?   Mental Status: She is alert.  ?Psychiatric:     ?   Mood and Affect: Mood normal.     ?   Behavior: Behavior normal.  ? ? ? ?UC Treatments / Results  ?Labs ?(all labs ordered are listed, but only abnormal results are displayed) ?Labs Reviewed  ?POCT RAPID STREP A (OFFICE)  ? ? ?EKG ? ? ?Radiology ?No results found. ? ?Procedures ?Procedures (including critical care time) ? ?Medications Ordered in UC ?Medications - No data to display ? ?Initial Impression / Assessment and Plan / UC Course  ?I have reviewed the triage vital signs and the nursing notes. ? ?Pertinent labs & imaging results that were  available during my care of the patient were reviewed by me and considered in my medical decision making (see chart for details). ? ?Seasonal allergies, sore  throat.  Rapid strep negative.  Discussed Xyzal instead of Zyrtec during heavy allergy season.  Also discussed Flonase nasal spray.  Instructed patient to follow up with her PCP if her symptoms are not improving.  She agrees to plan of care.  ? ? ? ?Final Clinical Impressions(s) / UC Diagnoses  ? ?Final diagnoses:  ?Seasonal allergies  ?Sore throat  ? ? ? ?Discharge Instructions   ? ?  ?Your strep test is negative.  Take Xyzal and use Flonase nasal spray.  Follow up with your primary care provider if your symptoms are not improving.   ? ? ? ? ? ?ED Prescriptions   ?None ?  ? ?PDMP not reviewed this encounter. ?  ?Sharion Balloon, NP ?06/21/21 1229 ? ?

## 2021-06-21 NOTE — Discharge Instructions (Addendum)
Your strep test is negative.  Take Xyzal and use Flonase nasal spray.  Follow up with your primary care provider if your symptoms are not improving.   ? ?

## 2021-06-27 ENCOUNTER — Ambulatory Visit: Payer: No Typology Code available for payment source | Admitting: Physical Therapy

## 2021-07-04 ENCOUNTER — Encounter: Payer: No Typology Code available for payment source | Admitting: Physical Therapy

## 2021-07-10 ENCOUNTER — Ambulatory Visit: Payer: No Typology Code available for payment source | Attending: Certified Nurse Midwife | Admitting: Physical Therapy

## 2021-07-10 ENCOUNTER — Encounter: Payer: Self-pay | Admitting: Physical Therapy

## 2021-07-10 DIAGNOSIS — M6281 Muscle weakness (generalized): Secondary | ICD-10-CM | POA: Diagnosis present

## 2021-07-10 DIAGNOSIS — R278 Other lack of coordination: Secondary | ICD-10-CM

## 2021-07-10 NOTE — Therapy (Signed)
?OUTPATIENT PHYSICAL THERAPY TREATMENT NOTE ? ? ?Patient Name: Anna Kim ?MRN: [id number] ?DOB:Aug 17, 1975, 46 y.o., female ?Today's Date: 07/10/2021 ? ?PCP: Leone Haven, MD ?REFERRING PROVIDER: Philip Aspen, CNM ? ? PT End of Session - 07/10/21 1759   ? ? Visit Number 4   ? Number of Visits 12   ? Date for PT Re-Evaluation 08/15/21   ? Authorization Type IE 05/23/2021   ? PT Start Time 1800   ? PT Stop Time 1840   ? PT Time Calculation (min) 40 min   ? Activity Tolerance Patient tolerated treatment well   ? Behavior During Therapy [facility] for tasks assessed/performed   ? ?  ?  ? ?  ? ? ?Past Medical History:  ?Diagnosis Date  ? History of hypertension 01/20/2016  ? HLD (hyperlipidemia)   ? HTN (hypertension)   ? Migraine   ? Ureteral stone   ? Vaginal Pap smear, abnormal   ? Remote history of 1 abnormal pap  ? ?Past Surgical History:  ?Procedure Laterality Date  ? TONSILLECTOMY  2011  ? ?Patient Active Problem List  ? Diagnosis Date Noted  ? Heartburn 02/01/2021  ? Right ankle sprain 02/01/2021  ? Joint pain 08/01/2020  ? Need for hepatitis C screening test 06/04/2020  ? Neck pain 12/15/2019  ? Overweight 07/20/2019  ? Hypertension 03/11/2019  ? Vitamin D deficiency 03/11/2019  ? Allergic rhinitis 07/30/2018  ? Left foot pain 03/04/2018  ? Skin exam, screening for cancer 03/04/2018  ? 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  ? Prediabetes 01/20/2016  ? Hyperlipidemia 01/20/2016  ? ? ?REFERRING DIAG: N39.41 (ICD-10-CM) - Urge incontinence of urine  ? ?THERAPY DIAG:  ?Muscle weakness (generalized) ? ?Other lack of coordination ? ?PERTINENT HISTORY: see above ? ?PRECAUTIONS: none ? ?SUBJECTIVE: Patient notes that she has not had any softball games since last visit due to weather cancellations. Patient has been doing hip strengthening every other day with good effect.  ? ?PAIN:  ?Are you having pain? No ? ? ? ?TREATMENT ? ?Pre-treatment assessment: ? ?Manual Therapy: ? ? ?Neuromuscular Re-education: ?Supine  hooklying diaphragmatic breathing with VCs and TCs for downregulation of the nervous system and improved management of IAP ?Supine hooklying, PFM contractions (x4) with exhalation. VCs and TCs to decrease compensatory patterns and encourage activation of the PFM. ?Beginner Imprint and Release   ?Dead Bug ?Beginner Lower Lift  ?Cat Cow  ?Bird Dog Progression  ? ? ?Therapeutic Exercise: ? ? ?Treatments unbilled: ? ?Post-treatment assessment: ? ?Patient educated throughout session on appropriate technique and form using multi-modal cueing, HEP, and activity modification. Patient articulated understanding and returned demonstration. ? ?Patient Response to interventions: ?Does note report increased pain ? ? ? PT Long Term Goals  ? ?  ? PT LONG TERM GOAL #1  ? Title Patient will demonstrate independence with HEP in order to maximize therapeutic gains and improve carryover from physical therapy sessions to ADLs in the home and community.   ? Baseline IE: provided   ? Time 12   ? Period Weeks   ? Status New   ? Target Date 08/15/21   ?  ? PT LONG TERM GOAL #2  ? Title Patient will demonstrate circumferential and sequential contraction of >4/5 MMT, > 6 sec hold x10 and 5 consecutive quick flicks with </= 10 min rest between testing bouts, and relaxation of the PFM coordinated with breath for improved management of intra-abdominal pressure and normal bowel and bladder function without the presence of pain  nor incontinence in order to improve participation at home and in the community.   ? Baseline IE: 2/5, 5x , endurane not tested   ? Time 12   ? Period Weeks   ? Status New   ? Target Date 08/15/21   ?  ? PT LONG TERM GOAL #3  ? Title Patient will report less than 2 incidents of stress urinary incontinence over the course of 3 weeks while coughing/sneezing/laughing/running/cutting activity in order to demonstrate improved PFM coordination, strength, and function for improved overall QOL.   ? Baseline IE: 100% of the time with  cutting/sprinting   ? Time 12   ? Period Weeks   ? Status New   ? Target Date 08/15/21   ?  ? PT LONG TERM GOAL #4  ? Title Patient will report confidence in ability to control bladder > 8/10 in order to demonstrate improved function and ability to participate more fully in activities at home and in the community.   ? Baseline IE: 5/10   ? Time 12   ? Period Weeks   ? Status New   ? Target Date 08/15/21   ?  ? PT LONG TERM GOAL #5  ? Title Patient will demonstrate improved function as evidenced by a score of 63 on FOTO measure for full participation in activities at home and in the community.   ? Baseline IE: 21   ? Time 12   ? Period Weeks   ? Status New   ? Target Date 08/15/21   ? ?  ?  ? ?  ? ? Plan  ? ? Clinical Impression Statement Patient presents to clinic with excellent motivation to participate in therapy. Patient demonstrates deficits in IAP management, PFM strength, and PFM coordination. Patient had increased difficulty relaxing PFM after fourth consecutive rep during today's session and responded positively to core stabilization interventions. Patient will benefit from continued skilled therapeutic intervention to address remaining deficits in IAP management, PFM strength, and PFM coordination in order to increase function and improve overall QOL.   ? Personal Factors and Comorbidities Behavior Pattern;Comorbidity 3+;Past/Current Experience;Time since onset of injury/illness/exacerbation   ? Comorbidities HLD, HTN, migraine, prediabetes   ? Examination-Activity Limitations Continence;Lift;Squat;Locomotion Level   ? Examination-Participation Restrictions Community Activity;Other   ? Stability/Clinical Decision Making Evolving/Moderate complexity   ? Rehab Potential Good   ? PT Frequency 1x / week   ? PT Duration 12 weeks   ? PT Treatment/Interventions ADLs/Self Care Home Management;Cryotherapy;Electrical Stimulation;Moist Heat;Therapeutic activities;Therapeutic exercise;Neuromuscular  re-education;Patient/family education;Orthotic Fit/Training;Manual techniques;Scar mobilization;Taping   ? PT Next Visit Plan deep core training   ? PT Home Exercise Plan [id number]   ? Consulted and Agree with Plan of Care Patient   ? ?  ?  ? ?  ? ? ? ? ? ? ?Myles Gip PT, DPT [phone]  ?07/10/2021, 6:00 PM ? ?  ? ?

## 2021-07-11 ENCOUNTER — Encounter: Payer: No Typology Code available for payment source | Admitting: Physical Therapy

## 2021-07-14 ENCOUNTER — Other Ambulatory Visit: Payer: Self-pay

## 2021-07-14 MED FILL — Pravastatin Sodium Tab 40 MG: ORAL | 90 days supply | Qty: 90 | Fill #1 | Status: AC

## 2021-07-20 ENCOUNTER — Ambulatory Visit: Payer: No Typology Code available for payment source | Admitting: Physical Therapy

## 2021-07-20 ENCOUNTER — Encounter: Payer: Self-pay | Admitting: Physical Therapy

## 2021-07-20 DIAGNOSIS — M6281 Muscle weakness (generalized): Secondary | ICD-10-CM | POA: Diagnosis not present

## 2021-07-20 DIAGNOSIS — R278 Other lack of coordination: Secondary | ICD-10-CM

## 2021-07-20 NOTE — Therapy (Signed)
?OUTPATIENT PHYSICAL THERAPY TREATMENT NOTE ? ? ?Patient Name: Anna Kim ?MRN: [id number] ?DOB:05/29/1975, 46 y.o., female ?Today's Date: 07/20/2021 ? ?PCP: Leone Haven, MD ?REFERRING PROVIDER: Philip Aspen, CNM ? ? PT End of Session - 07/20/21 1712   ? ? Visit Number 5   ? Number of Visits 12   ? Date for PT Re-Evaluation 08/15/21   ? Authorization Type IE 05/23/2021   ? PT Start Time 1710   ? PT Stop Time 1750   ? PT Time Calculation (min) 40 min   ? Activity Tolerance Patient tolerated treatment well   ? Behavior During Therapy [facility] for tasks assessed/performed   ? ?  ?  ? ?  ? ? ?Past Medical History:  ?Diagnosis Date  ? History of hypertension 01/20/2016  ? HLD (hyperlipidemia)   ? HTN (hypertension)   ? Migraine   ? Ureteral stone   ? Vaginal Pap smear, abnormal   ? Remote history of 1 abnormal pap  ? ?Past Surgical History:  ?Procedure Laterality Date  ? TONSILLECTOMY  2011  ? ?Patient Active Problem List  ? Diagnosis Date Noted  ? Heartburn 02/01/2021  ? Right ankle sprain 02/01/2021  ? Joint pain 08/01/2020  ? Need for hepatitis C screening test 06/04/2020  ? Neck pain 12/15/2019  ? Overweight 07/20/2019  ? Hypertension 03/11/2019  ? Vitamin D deficiency 03/11/2019  ? Allergic rhinitis 07/30/2018  ? Left foot pain 03/04/2018  ? Skin exam, screening for cancer 03/04/2018  ? 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  ? Prediabetes 01/20/2016  ? Hyperlipidemia 01/20/2016  ? ? ?REFERRING DIAG: N39.41 (ICD-10-CM) - Urge incontinence of urine  ? ?THERAPY DIAG:  ?Muscle weakness (generalized) ? ?Other lack of coordination ? ?PERTINENT HISTORY: see above ? ?PRECAUTIONS: none ? ?SUBJECTIVE: Patient reports that she had 2 ball games since last session. She did hip mobility and strengthening drills which seemed to help. Patient notes that normally she would wear a bigger pad, and she didn't feel like she had any UI in one game. She did have some UI but less volume with sprinting to a base during another game.   ? ?PAIN:  ?Are you having pain? No ? ? ?TREATMENT ? ?Pre-treatment assessment: ? ?Manual Therapy: ? ? ?Neuromuscular Re-education: ?Postural stability interventions: ?Beginner Imprint and Release   ?Dead Bug ?Beginner Lower Lift  ?Cat Cow  ?Bird Dog Progression ?Mermaid, B ?Figure 4, B ?Seated Hamstring, B ? ? ?Therapeutic Exercise: ? ? ?Treatments unbilled: ? ?Post-treatment assessment: ? ?Patient educated throughout session on appropriate technique and form using multi-modal cueing, HEP, and activity modification. Patient articulated understanding and returned demonstration. ? ?Patient Response to interventions: ?Notes a good workout ? ? ? PT Long Term Goals  ? ?  ? PT LONG TERM GOAL #1  ? Title Patient will demonstrate independence with HEP in order to maximize therapeutic gains and improve carryover from physical therapy sessions to ADLs in the home and community.   ? Baseline IE: provided   ? Time 12   ? Period Weeks   ? Status New   ? Target Date 08/15/21   ?  ? PT LONG TERM GOAL #2  ? Title Patient will demonstrate circumferential and sequential contraction of >4/5 MMT, > 6 sec hold x10 and 5 consecutive quick flicks with </= 10 min rest between testing bouts, and relaxation of the PFM coordinated with breath for improved management of intra-abdominal pressure and normal bowel and bladder function without the presence of pain  nor incontinence in order to improve participation at home and in the community.   ? Baseline IE: 2/5, 5x , endurane not tested   ? Time 12   ? Period Weeks   ? Status New   ? Target Date 08/15/21   ?  ? PT LONG TERM GOAL #3  ? Title Patient will report less than 2 incidents of stress urinary incontinence over the course of 3 weeks while coughing/sneezing/laughing/running/cutting activity in order to demonstrate improved PFM coordination, strength, and function for improved overall QOL.   ? Baseline IE: 100% of the time with cutting/sprinting   ? Time 12   ? Period Weeks   ? Status New    ? Target Date 08/15/21   ?  ? PT LONG TERM GOAL #4  ? Title Patient will report confidence in ability to control bladder > 8/10 in order to demonstrate improved function and ability to participate more fully in activities at home and in the community.   ? Baseline IE: 5/10   ? Time 12   ? Period Weeks   ? Status New   ? Target Date 08/15/21   ?  ? PT LONG TERM GOAL #5  ? Title Patient will demonstrate improved function as evidenced by a score of 63 on FOTO measure for full participation in activities at home and in the community.   ? Baseline IE: 36   ? Time 12   ? Period Weeks   ? Status New   ? Target Date 08/15/21   ? ?  ?  ? ?  ? ? Plan  ? ? Clinical Impression Statement Patient presents to clinic with excellent motivation to participate in therapy. Patient demonstrates deficits in IAP management, PFM strength, and PFM coordination. Patient had excellent form with all postural stabilization interventions during today's session and responded positively to active interventions. Patient will benefit from continued skilled therapeutic intervention to address remaining deficits in IAP management, PFM strength, and PFM coordination in order to increase function and improve overall QOL.   ? Personal Factors and Comorbidities Behavior Pattern;Comorbidity 3+;Past/Current Experience;Time since onset of injury/illness/exacerbation   ? Comorbidities HLD, HTN, migraine, prediabetes   ? Examination-Activity Limitations Continence;Lift;Squat;Locomotion Level   ? Examination-Participation Restrictions Community Activity;Other   ? Stability/Clinical Decision Making Evolving/Moderate complexity   ? Rehab Potential Good   ? PT Frequency 1x / week   ? PT Duration 12 weeks   ? PT Treatment/Interventions ADLs/Self Care Home Management;Cryotherapy;Electrical Stimulation;Moist Heat;Therapeutic activities;Therapeutic exercise;Neuromuscular re-education;Patient/family education;Orthotic Fit/Training;Manual techniques;Scar  mobilization;Taping   ? PT Next Visit Plan deep core training   ? PT Home Exercise Plan [id number]   ? Consulted and Agree with Plan of Care Patient   ? ?  ?  ? ?  ? ? ? ? ? ? ?Myles Gip PT, DPT [phone]  ?07/20/2021, 5:12 PM ? ?  ? ?

## 2021-07-27 ENCOUNTER — Other Ambulatory Visit: Payer: Self-pay

## 2021-08-02 ENCOUNTER — Ambulatory Visit: Payer: No Typology Code available for payment source | Admitting: Family Medicine

## 2021-08-03 ENCOUNTER — Encounter: Payer: Self-pay | Admitting: Physical Therapy

## 2021-08-07 ENCOUNTER — Encounter: Payer: Self-pay | Admitting: Physical Therapy

## 2021-08-07 ENCOUNTER — Ambulatory Visit: Payer: No Typology Code available for payment source | Attending: Certified Nurse Midwife | Admitting: Physical Therapy

## 2021-08-07 ENCOUNTER — Other Ambulatory Visit: Payer: Self-pay

## 2021-08-07 ENCOUNTER — Ambulatory Visit (INDEPENDENT_AMBULATORY_CARE_PROVIDER_SITE_OTHER): Payer: No Typology Code available for payment source | Admitting: Family Medicine

## 2021-08-07 ENCOUNTER — Encounter: Payer: Self-pay | Admitting: Family Medicine

## 2021-08-07 DIAGNOSIS — E785 Hyperlipidemia, unspecified: Secondary | ICD-10-CM | POA: Diagnosis not present

## 2021-08-07 DIAGNOSIS — M6281 Muscle weakness (generalized): Secondary | ICD-10-CM | POA: Diagnosis present

## 2021-08-07 DIAGNOSIS — I10 Essential (primary) hypertension: Secondary | ICD-10-CM | POA: Diagnosis not present

## 2021-08-07 DIAGNOSIS — J309 Allergic rhinitis, unspecified: Secondary | ICD-10-CM

## 2021-08-07 DIAGNOSIS — R278 Other lack of coordination: Secondary | ICD-10-CM | POA: Diagnosis present

## 2021-08-07 DIAGNOSIS — R12 Heartburn: Secondary | ICD-10-CM

## 2021-08-07 MED ORDER — OMEPRAZOLE 20 MG PO CPDR
20.0000 mg | DELAYED_RELEASE_CAPSULE | Freq: Every day | ORAL | 3 refills | Status: DC
  Filled 2021-08-07: qty 30, 30d supply, fill #0
  Filled 2021-09-02: qty 30, 30d supply, fill #1
  Filled 2021-10-04: qty 30, 30d supply, fill #2
  Filled 2021-11-01: qty 30, 30d supply, fill #3

## 2021-08-07 MED ORDER — MONTELUKAST SODIUM 10 MG PO TABS
10.0000 mg | ORAL_TABLET | Freq: Every day | ORAL | 3 refills | Status: DC
  Filled 2021-08-07: qty 30, 30d supply, fill #0
  Filled 2021-09-02: qty 30, 30d supply, fill #1
  Filled 2021-10-04: qty 30, 30d supply, fill #2
  Filled 2021-11-01: qty 30, 30d supply, fill #3

## 2021-08-07 NOTE — Assessment & Plan Note (Signed)
>>  ASSESSMENT AND PLAN FOR HEARTBURN WRITTEN ON 08/07/2021  3:45 PM BY SONNENBERG, ERIC G, MD  Continues to intermittently be an issue.  Discussed using Prilosec daily 30 minutes before breakfast.  Discussed the risk of GI infection, bone issues related to poor absorption of vitamins and minerals, and kidney issues with chronic use of Prilosec.  Discussed that the benefit to her likely outweighs the risk given chronic reflux issues.

## 2021-08-07 NOTE — Assessment & Plan Note (Signed)
We will add on Singulair.  She will choose either Zyrtec or Xyzal.  She can also use Flonase if needed.  If not improving she will let us know.  Did discuss the risk of anxiety or depression issues with Singulair. ?

## 2021-08-07 NOTE — Therapy (Signed)
?OUTPATIENT PHYSICAL THERAPY TREATMENT NOTE ? ? ?Patient Name: Anna Kim ?MRN: [id number] ?DOB:[birth year], 46 y.o., female ?Today's Date: 08/07/2021 ? ?PCP: Leone Haven, MD ?REFERRING PROVIDER: Philip Aspen, CNM ? ? PT End of Session - 08/07/21 1708   ? ? Visit Number 6   ? Number of Visits 12   ? Date for PT Re-Evaluation 08/15/21   ? Authorization Type IE 05/23/2021   ? PT Start Time 1708   ? PT Stop Time 6834   ? PT Time Calculation (min) 40 min   ? Activity Tolerance Patient tolerated treatment well   ? Behavior During Therapy [facility] for tasks assessed/performed   ? ?  ?  ? ?  ? ? ?Past Medical History:  ?Diagnosis Date  ? History of hypertension 01/20/2016  ? HLD (hyperlipidemia)   ? HTN (hypertension)   ? Migraine   ? Ureteral stone   ? Vaginal Pap smear, abnormal   ? Remote history of 1 abnormal pap  ? ?Past Surgical History:  ?Procedure Laterality Date  ? TONSILLECTOMY  2011  ? ?Patient Active Problem List  ? Diagnosis Date Noted  ? Heartburn 02/01/2021  ? Right ankle sprain 02/01/2021  ? Joint pain 08/01/2020  ? Need for hepatitis C screening test 06/04/2020  ? Neck pain 12/15/2019  ? Overweight 07/20/2019  ? Hypertension 03/11/2019  ? Vitamin D deficiency 03/11/2019  ? Allergic rhinitis 07/30/2018  ? Left foot pain 03/04/2018  ? Skin exam, screening for cancer 03/04/2018  ? 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  ? Prediabetes 01/20/2016  ? Hyperlipidemia 01/20/2016  ? ? ?REFERRING DIAG: N39.41 (ICD-10-CM) - Urge incontinence of urine  ? ?THERAPY DIAG:  ?Muscle weakness (generalized) ? ?Other lack of coordination ? ?PERTINENT HISTORY: see above ? ?PRECAUTIONS: none ? ?SUBJECTIVE: Patient states that she has been playing softball and has had less volume with UI. Patient has been doing hip mobility drills before games with good effect. Patient reports that she still has concerns for bladder control with a full bladder combined with vigorous physical activity. Patient notes continued increased leakage  but notes some days she has minimal leakage/less volume of UI.  ? ?PAIN:  ?Are you having pain? No ? ? ?TREATMENT ? ?Pre-treatment assessment: ? ?Manual Therapy: ? ? ?Neuromuscular Re-education: ?Reassessed goals; see below.  ?Motor control/postural control interventions for PFM including:  ?Gentle bouncing for 5 sec bouts ?Jumping jacks for 5 sec bouts ? ?Therapeutic Exercise: ? ? ?Treatments unbilled: ? ?Post-treatment assessment: ? ?Patient educated throughout session on appropriate technique and form using multi-modal cueing, HEP, and activity modification. Patient articulated understanding and returned demonstration. ? ?Patient Response to interventions: ? ? ? ? PT Long Term Goals  ? ?  ? PT LONG TERM GOAL #1  ? Title Patient will demonstrate independence with HEP in order to maximize therapeutic gains and improve carryover from physical therapy sessions to ADLs in the home and community.   ? Baseline IE: provided ; 5/15: IND  ? Time 12   ? Period Weeks   ? Status Achieved   ? Target Date 08/15/21   ?  ? PT LONG TERM GOAL #2  ? Title Patient will demonstrate circumferential and sequential contraction of >4/5 MMT, > 6 sec hold x10 and 5 consecutive quick flicks with </= 10 min rest between testing bouts, and relaxation of the PFM coordinated with breath for improved management of intra-abdominal pressure and normal bowel and bladder function without the presence of pain nor incontinence in order  to improve participation at home and in the community.   ? Baseline IE: 2/5, 5x , endurance not tested; 5/15:   ? Time 12   ? Period Weeks   ? Status New   ? Target Date 08/15/21   ?  ? PT LONG TERM GOAL #3  ? Title Patient will report less than 2 incidents of stress urinary incontinence over the course of 3 weeks while coughing/sneezing/laughing/running/cutting activity in order to demonstrate improved PFM coordination, strength, and function for improved overall QOL.   ? Baseline IE: 100% of the time with  cutting/sprinting; 5/15: 70% of the time with running, sneezing, cutting  ? Time 12   ? Period Weeks   ? Status Ongoing   ? Target Date 08/15/21   ?  ? PT LONG TERM GOAL #4  ? Title Patient will report confidence in ability to control bladder > 8/10 in order to demonstrate improved function and ability to participate more fully in activities at home and in the community.   ? Baseline IE: 5/10; 5/15: 5/10  ? Time 12   ? Period Weeks   ? Status Ongoing   ? Target Date 08/15/21   ?  ? PT LONG TERM GOAL #5  ? Title Patient will demonstrate improved function as evidenced by a score of 63 on FOTO measure for full participation in activities at home and in the community.   ? Baseline IE: 52 ; 5/15: 53  ? Time 12   ? Period Weeks   ? Status Ongoing   ? Target Date 08/15/21   ? ?  ?  ? ?  ? ? Plan  ? ? Clinical Impression Statement Patient presents to clinic with excellent motivation to participate in therapy. Patient demonstrates deficits in IAP management, PFM strength, and PFM coordination. Patient indicating limited progress toward goals during today's session. Patient has shown modest improvement in power of PFM contraction over the past 10 weeks despite regular HEP; patient has demonstrated improved coordination and control- better able to release a contraction--but fast-twitch muscle fiber activation persists as a deficit. Patient has responded positively to active interventions. Patient will benefit from continued skilled therapeutic intervention to address remaining deficits in IAP management, PFM strength, and PFM coordination in order to increase function and improve overall QOL.   ? Personal Factors and Comorbidities Behavior Pattern;Comorbidity 3+;Past/Current Experience;Time since onset of injury/illness/exacerbation   ? Comorbidities HLD, HTN, migraine, prediabetes   ? Examination-Activity Limitations Continence;Lift;Squat;Locomotion Level   ? Examination-Participation Restrictions Community Activity;Other   ?  Stability/Clinical Decision Making Evolving/Moderate complexity   ? Rehab Potential Good   ? PT Frequency 1x / week   ? PT Duration 12 weeks   ? PT Treatment/Interventions ADLs/Self Care Home Management;Cryotherapy;Electrical Stimulation;Moist Heat;Therapeutic activities;Therapeutic exercise;Neuromuscular re-education;Patient/family education;Orthotic Fit/Training;Manual techniques;Scar mobilization;Taping   ? PT Next Visit Plan deep core training   ? PT Home Exercise Plan [id number]   ? Consulted and Agree with Plan of Care Patient   ? ?  ?  ? ?  ? ? ? ? ? ? Myles Gip PT, DPT [phone]  ?08/07/2021, 5:08 PM ? ?  ? ?

## 2021-08-07 NOTE — Assessment & Plan Note (Signed)
Check lipid panel.  Continue pravastatin 40 mg daily. 

## 2021-08-07 NOTE — Progress Notes (Signed)
?Tommi Rumps, MD ?Phone: [phone] ? ?Anna Kim is a 46 y.o. female who presents today for f/u. ? ?HYPERTENSION ?Disease Monitoring ?Home BP Monitoring not checking Chest pain- no    Dyspnea- no ?Medications ?Compliance-  taking amlodipine.   Edema- no ?BMET ?   ?Component Value Date/Time  ? NA 140 02/01/2021 1416  ? NA 139 03/22/2015 1123  ? K 3.6 02/01/2021 1416  ? CL 103 02/01/2021 1416  ? CO2 28 02/01/2021 1416  ? GLUCOSE 93 02/01/2021 1416  ? BUN 10 02/01/2021 1416  ? BUN 9 03/22/2015 1123  ? CREATININE 0.67 02/01/2021 1416  ? CREATININE 0.69 11/16/2016 1601  ? CALCIUM 9.1 02/01/2021 1416  ? GFRNONAA >60 05/18/2020 0723  ? GFRAA >60 03/06/2019 1542  ? ?HYPERLIPIDEMIA ?Symptoms ?Chest pain on exertion:  no   ?Medications: ?Compliance- taking pravastatin Right upper quadrant pain- no  Muscle aches- no ?Lipid Panel  ?   ?Component Value Date/Time  ? CHOL 164 05/18/2020 0723  ? TRIG 91 05/18/2020 0723  ? HDL 48 05/18/2020 0723  ? CHOLHDL 3.4 05/18/2020 0723  ? VLDL 18 05/18/2020 0723  ? [place] 98 05/18/2020 0723  ? LDLDIRECT 100.0 06/07/2017 0800  ? ?GERD:   ?Reflux symptoms: yes, intermittently   ?Abd pain: no   ?Blood in stool: no  ?Dysphagia: no  ?No family history of esophageal or gastric cancer.   ?EGD: no  ?Medication: took prilosec for 14 days and noted improvement in reflux, though recurred after stopping prilosec ? ?Allergic rhinitis: Patient notes this time a year is the worst for her.  Gets sneezing and itchy red eyes.  She has a hard time remembering to take her Flonase daily.  She has been taking Xyzal at night though also started taking Zyrtec in the morning. ? ? ?Social History  ? ?Tobacco Use  ?Smoking Status Never  ?Smokeless Tobacco Never  ? ? ?Current Outpatient Medications on File Prior to Visit  ?Medication Sig Dispense Refill  ? amLODipine (NORVASC) 5 MG tablet Take 1 tablet (5 mg total) by mouth daily. 90 tablet 2  ? Cholecalciferol 125 MCG (5000 UT) TABS cholecalciferol  (vitamin D3) 125 mcg (5,000 unit) tablet    ? fluticasone (FLONASE) 50 MCG/ACT nasal spray Place 2 sprays into both nostrils daily. 16 g 1  ? Norethindrone Acetate-Ethinyl Estradiol (LARIN 1.5/30) 1.5-30 MG-MCG tablet TAKE 1 TABLET BY MOUTH DAILY. 21 tablet 11  ? pravastatin (PRAVACHOL) 40 MG tablet TAKE 1 TABLET BY MOUTH DAILY. 90 tablet 3  ? RHOFADE 1 % CREA APPLY ONE PEA SIZE AMOUNT ON EACH CHEEK AND SPREAD TO ALL AFFECTED AREAS    ? zinc gluconate 50 MG tablet Take 50 mg by mouth daily.    ? [DISCONTINUED] Calcium Carb-Cholecalciferol (CALCIUM + D3 PO) Take 1 tablet by mouth daily.    ? ?No current facility-administered medications on file prior to visit.  ? ? ? ?ROS see history of present illness ? ?Objective ? ?Physical Exam ?Vitals:  ? 08/07/21 1533  ?BP: 110/70  ?Pulse: 73  ?Temp: 98.5 ?F (36.9 ?C)  ?SpO2: 98%  ? ? ?BP Readings from Last 3 Encounters:  ?08/07/21 110/70  ?06/21/21 136/84  ?05/01/21 131/81  ? ?Wt Readings from Last 3 Encounters:  ?08/07/21 183 lb 9.6 oz (83.3 kg)  ?05/01/21 178 lb 3.2 oz (80.8 kg)  ?02/01/21 178 lb 12.8 oz (81.1 kg)  ? ? ?Physical Exam ?Constitutional:   ?   General: She is not in acute distress. ?  Appearance: She is not diaphoretic.  ?Cardiovascular:  ?   Rate and Rhythm: Normal rate and regular rhythm.  ?   Heart sounds: Normal heart sounds.  ?Pulmonary:  ?   Effort: Pulmonary effort is normal.  ?   Breath sounds: Normal breath sounds.  ?Skin: ?   General: Skin is warm and dry.  ?Neurological:  ?   Mental Status: She is alert.  ? ? ? ?Assessment/Plan: Please see individual problem list. ? ?Problem List Items Addressed This Visit   ? ? Allergic rhinitis (Chronic)  ?  We will add on Singulair.  She will choose either Zyrtec or Xyzal.  She can also use Flonase if needed.  If not improving she will let us know.  Did discuss the risk of anxiety or depression issues with Singulair. ? ?  ?  ? Relevant Medications  ? montelukast (SINGULAIR) 10 MG tablet  ? Heartburn (Chronic)  ?   Continues to intermittently be an issue.  Discussed using Prilosec daily 30 minutes before breakfast.  Discussed the risk of GI infection, bone issues related to poor absorption of vitamins and minerals, and kidney issues with chronic use of Prilosec.  Discussed that the benefit to her likely outweighs the risk given chronic reflux issues. ? ?  ?  ? Relevant Medications  ? omeprazole (PRILOSEC) 20 MG capsule  ? Hyperlipidemia (Chronic)  ?  Check lipid panel.  Continue pravastatin 40 mg daily. ? ?  ?  ? Relevant Orders  ? Lipid panel  ? Comp Met (CMET)  ? Hypertension (Chronic)  ?  Well-controlled.  She will continue amlodipine 5 mg once daily. ? ?  ?  ? Relevant Orders  ? Comp Met (CMET)  ? ? ?Return in about 6 months (around 02/07/2022) for Hypertension. ? ? ?Tommi Rumps, MD ?[place] ? ?

## 2021-08-07 NOTE — Assessment & Plan Note (Signed)
Well-controlled.  She will continue amlodipine 5 mg once daily. 

## 2021-08-07 NOTE — Assessment & Plan Note (Signed)
Continues to intermittently be an issue.  Discussed using Prilosec daily 30 minutes before breakfast.  Discussed the risk of GI infection, bone issues related to poor absorption of vitamins and minerals, and kidney issues with chronic use of Prilosec.  Discussed that the benefit to her likely outweighs the risk given chronic reflux issues. ?

## 2021-08-07 NOTE — Patient Instructions (Signed)
Nice to see you. ?I sent Prilosec and Singulair in.  If your allergies are not improving with the use of Singulair please let us know. ?Please use either Xyzal or Zyrtec. ?

## 2021-08-08 LAB — COMPREHENSIVE METABOLIC PANEL
ALT: 9 U/L (ref 0–35)
AST: 13 U/L (ref 0–37)
Albumin: 4.1 g/dL (ref 3.5–5.2)
Alkaline Phosphatase: 70 U/L (ref 39–117)
BUN: 13 mg/dL (ref 6–23)
CO2: 25 mEq/L (ref 19–32)
Calcium: 9.4 mg/dL (ref 8.4–10.5)
Chloride: 104 mEq/L (ref 96–112)
Creatinine, Ser: 0.78 mg/dL (ref 0.40–1.20)
GFR: 90.4 mL/min (ref >60.00)
Glucose, Bld: 117 mg/dL — ABNORMAL HIGH (ref 70–99)
Potassium: 3.7 mEq/L (ref 3.5–5.1)
Sodium: 139 mEq/L (ref 135–145)
Total Bilirubin: 0.5 mg/dL (ref 0.2–1.2)
Total Protein: 6.7 g/dL (ref 6.0–8.3)

## 2021-08-08 LAB — LIPID PANEL
Cholesterol: 138 mg/dL (ref 0–200)
HDL: 47.7 mg/dL (ref >39.00)
LDL Cholesterol: 71 mg/dL (ref 0–99)
NonHDL: 89.99
Total CHOL/HDL Ratio: 3
Triglycerides: 96 mg/dL (ref 0.0–149.0)
VLDL: 19.2 mg/dL (ref 0.0–40.0)

## 2021-08-24 ENCOUNTER — Ambulatory Visit: Payer: No Typology Code available for payment source | Attending: Certified Nurse Midwife | Admitting: Physical Therapy

## 2021-08-24 ENCOUNTER — Encounter: Payer: Self-pay | Admitting: Physical Therapy

## 2021-08-24 DIAGNOSIS — R278 Other lack of coordination: Secondary | ICD-10-CM | POA: Diagnosis present

## 2021-08-24 DIAGNOSIS — M6281 Muscle weakness (generalized): Secondary | ICD-10-CM | POA: Diagnosis present

## 2021-08-24 NOTE — Therapy (Signed)
OUTPATIENT PHYSICAL THERAPY TREATMENT NOTE   Patient Name: Anna Kim MRN: [id number] DOB:11/03/1975, 46 y.o., female Today's Date: 08/25/2021  PCP: Leone Haven, MD REFERRING PROVIDER: Philip Aspen CNM   PT End of Session - 08/24/21 1721     Visit Number 7    Number of Visits 12    Date for PT Re-Evaluation 11/16/21    Authorization Type IE 05/23/2021    PT Start Time 1720    PT Stop Time 1800    PT Time Calculation (min) 40 min    Activity Tolerance Patient tolerated treatment well    Behavior During Therapy [facility] for tasks assessed/performed             Past Medical History:  Diagnosis Date   History of hypertension 01/20/2016   HLD (hyperlipidemia)    HTN (hypertension)    Migraine    Ureteral stone    Vaginal Pap smear, abnormal    Remote history of 1 abnormal pap   Past Surgical History:  Procedure Laterality Date   TONSILLECTOMY  2011   Patient Active Problem List   Diagnosis Date Noted   Heartburn 02/01/2021   Right ankle sprain 02/01/2021   Joint pain 08/01/2020   Need for hepatitis C screening test 06/04/2020   Neck pain 12/15/2019   Overweight 07/20/2019   Hypertension 03/11/2019   Vitamin D deficiency 03/11/2019   Allergic rhinitis 07/30/2018   Left foot pain 03/04/2018   Skin exam, screening for cancer 03/04/2018   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 However patient notes with addition of hip abduction and impact she has much less control. Patient also notes that she continues to have UI despite use of PT prescribed  exercises. The leakage occurs throughout the day and with sporting activities. Patient does endorse a decrease in volume but is still reasonably bothered by the presence.   PAIN:  Are you having pain? No   TREATMENT  Pre-treatment assessment:  Manual Therapy:   Neuromuscular Re-education: Reassessed goals; see below.    Therapeutic Exercise:   Treatments unbilled:  Post-treatment assessment:  Patient educated throughout session on appropriate technique and form using multi-modal cueing, HEP, and activity modification. Patient articulated understanding and returned demonstration.  Patient Response to interventions: Comfortable to continue with HEP and follow-up with urology    PT Long Term Goals      PT LONG TERM GOAL #1   Title Patient will demonstrate independence with HEP in order to maximize therapeutic gains and improve carryover from physical therapy sessions to ADLs in the home and community.    Baseline IE: provided ; 5/15: IND   Time 12    Period Weeks    Status Achieved    Target Date 08/15/21      PT LONG TERM GOAL #2   Title Patient will demonstrate circumferential and sequential contraction of >4/5 MMT, > 6 sec hold x10 and 5 consecutive quick flicks with </= 10 min rest between testing bouts, and relaxation of the PFM coordinated with breath for improved management of intra-abdominal pressure and normal bowel and bladder function without the presence  of pain nor incontinence in order to improve participation at home and in the community.    Baseline IE: 2/5, 5x , endurance not tested; 6/1: 3/5, 5 sec x5 endurance holds   Time 12    Period Weeks    Status Partially met    Target Date 11/16/2021     PT LONG TERM GOAL #3   Title Patient will report less than 2 incidents of stress urinary incontinence over the course of 3 weeks while coughing/sneezing/laughing/running/cutting activity in order to demonstrate improved PFM coordination, strength, and function  for improved overall QOL.    Baseline IE: 100% of the time with cutting/sprinting; 5/15: 70% of the time with running, sneezing, cutting; 6/1: 70% of the time with running, sneezing, cutting   Time 12    Period Weeks    Status Ongoing    Target Date 11/16/2021      PT LONG TERM GOAL #4   Title Patient will report confidence in ability to control bladder > 8/10 in order to demonstrate improved function and ability to participate more fully in activities at home and in the community.    Baseline IE: 5/10; 5/15: 5/10; 6/1: 7/10 (empty) , 5/10 (full)   Time 12    Period Weeks    Status Ongoing    Target Date 11/16/2021      PT LONG TERM GOAL #5   Title Patient will demonstrate improved function as evidenced by a score of 63 on FOTO measure for full participation in activities at home and in the community.    Baseline IE: 52 ; 5/15: 53; 6/1: 54   Time 12    Period Weeks    Status Ongoing    Target Date 11/16/2021             Plan    Clinical Impression Statement Patient presents to clinic with excellent motivation to participate in therapy. Patient demonstrates persistent deficits in IAP management, PFM strength, and PFM coordination. Patient has improved PFM strength and coordination over the course of therapy and has maintained ability to relax PFM after contraction. AT this time patient is able to sustain a 5 sec hold of PFM contraction with both squeeze and lift for > 5 reps without leakage when tested in supine position. Patient has decreased overall volume of leakage since starting PT, but continues to have persistent leakage with vigorous and light physical activity potentially indicating a potential deficit in urinary sphincter function or other possible causes which would merit further work-up by urology. These potential causes as well as consideration for potential hormonal changes that may impact urinary continence were discussed with the patient during today's session and patient  was in agreement/amenable to referral to urology.  Patient has responded positively to active interventions through the course of care, but the lack of resolution cannot be explained by the present state of PFM function in the patient. Patient may benefit from continued skilled therapeutic intervention to address remaining deficits in IAP management, PFM strength, and PFM coordination in order to increase function and improve overall QOL.    Personal Factors and Comorbidities Behavior Pattern;Comorbidity 3+;Past/Current Experience;Time since onset of injury/illness/exacerbation    Comorbidities HLD, HTN, migraine, prediabetes    Examination-Activity Limitations Continence;Lift;Squat;Locomotion Level    Examination-Participation Restrictions Community Activity;Other    Stability/Clinical Decision Making Evolving/Moderate complexity    Rehab Potential Good    PT Frequency 1x / week    PT Duration 12 weeks    PT Treatment/Interventions ADLs/Self  Care Home Management;Cryotherapy;Electrical Stimulation;Moist Heat;Therapeutic activities;Therapeutic exercise;Neuromuscular re-education;Patient/family education;Orthotic Fit/Training;Manual techniques;Scar mobilization;Taping    PT Next Visit Plan deep core training    PT Home Exercise Plan [id number]    Consulted and Agree with Plan of Care Patient                 Myles Gip PT, DPT [phone]  08/25/2021, 11:41 AM

## 2021-09-02 ENCOUNTER — Other Ambulatory Visit: Payer: Self-pay

## 2021-09-04 ENCOUNTER — Other Ambulatory Visit: Payer: Self-pay

## 2021-09-25 ENCOUNTER — Ambulatory Visit
Admission: RE | Admit: 2021-09-25 | Discharge: 2021-09-25 | Disposition: A | Discharge status: Home / Self Care | Payer: No Typology Code available for payment source | Source: Ambulatory Visit | Attending: Certified Nurse Midwife | Admitting: Certified Nurse Midwife

## 2021-09-25 DIAGNOSIS — Z1231 Encounter for screening mammogram for malignant neoplasm of breast: Secondary | ICD-10-CM | POA: Insufficient documentation

## 2021-09-25 DIAGNOSIS — Z01419 Encounter for gynecological examination (general) (routine) without abnormal findings: Secondary | ICD-10-CM

## 2021-10-05 ENCOUNTER — Other Ambulatory Visit: Payer: Self-pay

## 2021-10-18 ENCOUNTER — Other Ambulatory Visit: Payer: Self-pay

## 2021-10-18 MED FILL — Pravastatin Sodium Tab 40 MG: ORAL | 90 days supply | Qty: 90 | Fill #2 | Status: AC

## 2021-10-27 ENCOUNTER — Other Ambulatory Visit: Payer: Self-pay

## 2021-11-01 ENCOUNTER — Other Ambulatory Visit: Payer: Self-pay

## 2021-11-06 ENCOUNTER — Ambulatory Visit: Payer: No Typology Code available for payment source | Admitting: Urology

## 2021-11-28 ENCOUNTER — Other Ambulatory Visit: Payer: Self-pay

## 2021-11-28 ENCOUNTER — Other Ambulatory Visit: Payer: Self-pay | Admitting: Family Medicine

## 2021-11-28 DIAGNOSIS — R12 Heartburn: Secondary | ICD-10-CM

## 2021-11-28 DIAGNOSIS — J309 Allergic rhinitis, unspecified: Secondary | ICD-10-CM

## 2021-11-28 MED ORDER — MONTELUKAST SODIUM 10 MG PO TABS
10.0000 mg | ORAL_TABLET | Freq: Every day | ORAL | 3 refills | Status: DC
  Filled 2021-11-28: qty 30, 30d supply, fill #0
  Filled 2022-01-01: qty 30, 30d supply, fill #1
  Filled 2022-01-27: qty 30, 30d supply, fill #2
  Filled 2022-03-11: qty 30, 30d supply, fill #3

## 2021-11-28 MED ORDER — OMEPRAZOLE 20 MG PO CPDR
20.0000 mg | DELAYED_RELEASE_CAPSULE | Freq: Every day | ORAL | 3 refills | Status: DC
  Filled 2021-11-28: qty 30, 30d supply, fill #0
  Filled 2022-01-01: qty 30, 30d supply, fill #1
  Filled 2022-01-27: qty 30, 30d supply, fill #2
  Filled 2022-03-11: qty 30, 30d supply, fill #3

## 2021-12-20 ENCOUNTER — Other Ambulatory Visit: Payer: Self-pay | Admitting: Orthopaedic Surgery

## 2021-12-20 DIAGNOSIS — S63601A Unspecified sprain of right thumb, initial encounter: Secondary | ICD-10-CM

## 2021-12-22 ENCOUNTER — Ambulatory Visit
Admission: RE | Admit: 2021-12-22 | Discharge: 2021-12-22 | Disposition: A | Discharge status: Home / Self Care | Payer: No Typology Code available for payment source | Source: Ambulatory Visit | Attending: Orthopaedic Surgery | Admitting: Orthopaedic Surgery

## 2021-12-22 DIAGNOSIS — S63601A Unspecified sprain of right thumb, initial encounter: Secondary | ICD-10-CM | POA: Diagnosis not present

## 2021-12-29 ENCOUNTER — Other Ambulatory Visit: Payer: No Typology Code available for payment source

## 2022-01-01 ENCOUNTER — Other Ambulatory Visit: Payer: Self-pay

## 2022-01-04 DIAGNOSIS — S63649A Sprain of metacarpophalangeal joint of unspecified thumb, initial encounter: Secondary | ICD-10-CM

## 2022-01-04 HISTORY — DX: Sprain of metacarpophalangeal joint of unspecified thumb, initial encounter: S63.649A

## 2022-01-17 ENCOUNTER — Other Ambulatory Visit: Payer: Self-pay

## 2022-01-17 MED FILL — Pravastatin Sodium Tab 40 MG: ORAL | 90 days supply | Qty: 90 | Fill #3 | Status: AC

## 2022-01-27 ENCOUNTER — Other Ambulatory Visit: Payer: Self-pay | Admitting: Family Medicine

## 2022-01-27 DIAGNOSIS — I10 Essential (primary) hypertension: Secondary | ICD-10-CM

## 2022-01-29 ENCOUNTER — Other Ambulatory Visit: Payer: Self-pay

## 2022-01-29 MED ORDER — AMLODIPINE BESYLATE 5 MG PO TABS
5.0000 mg | ORAL_TABLET | Freq: Every day | ORAL | 2 refills | Status: DC
  Filled 2022-01-29: qty 90, 90d supply, fill #0
  Filled 2022-04-25: qty 90, 90d supply, fill #1
  Filled 2022-07-25: qty 90, 90d supply, fill #2

## 2022-01-29 NOTE — Progress Notes (Signed)
[name] New Patient Evaluation and Consultation  Referring Provider: Leone Haven, MD PCP: Leone Haven, MD Date of Service: 02/02/2022  SUBJECTIVE Chief Complaint: New Patient (Initial Visit) (Anna Kim is a 46 y.o. female here for a consult for urinary incontinence.)  History of Present Illness: Anna Kim is a 46 y.o. White or Caucasian female presenting for evaluation of pelvic floor problems and urinary leakage.     Urinary Symptoms: Leaks urine with cough/ sneeze, laughing, and exercise Leaks Multiples time(s) per day.  Pad use:  liners/ mini-pads  She is bothered by her UI symptoms. Has done pelvic PT- had minimal improvement  Day time voids 5-6.  Nocturia: 1 times per night to void. Voiding dysfunction: she empties her bladder well.  does not use a catheter to empty bladder.  When urinating, she feels dribbling after finishing  UTIs: Denies UTI's in the last year.   Reports history of kidney stones several years ago.   Pelvic Organ Prolapse Symptoms:                  She Denies a feeling of a bulge the vaginal area.   Bowel Symptom: Bowel movements: Every other day Stool consistency: soft  Straining: no.  Splinting: no.  Incomplete evacuation: no.  She Denies accidental bowel leakage / fecal incontinence Bowel regimen: none Last colonoscopy: None  Sexual Function Sexually active: no.  Sexual orientation: Straight Pain with sex: No  Pelvic Pain Denies pelvic pain    Past Medical History:  Past Medical History:  Diagnosis Date   History of hypertension 01/20/2016   HLD (hyperlipidemia)    HTN (hypertension)    Migraine    Ureteral stone    Vaginal Pap smear, abnormal    Remote history of 1 abnormal pap     Past Surgical History:   Past Surgical History:  Procedure Laterality Date   TONSILLECTOMY  2011     Past OB/GYN History: OB History  Gravida Para Term Preterm AB Living  '1 1 1     1  '$ SAB IAB  Ectopic Multiple Live Births          1    # Outcome Date GA Lbr Len/2nd Weight Sex Delivery Anes PTL Lv  1 Term 09/23/04 [redacted]w[redacted]d 8 lb (3.629 kg) F Vag-Spont  N LIV   She had a third degree tear Menopausal: No Contraception: Lo Estrin BCP. Any history of abnormal pap smears: no.   Medications: She has a current medication list which includes the following prescription(s): amlodipine, cholecalciferol, fluticasone, montelukast, norethindrone acetate-ethinyl estradiol, omeprazole, pravastatin, rhofade, zinc gluconate, and [DISCONTINUED] calcium carb-cholecalciferol.   Allergies: Patient is allergic to ceftriaxone, erythromycin, and sulfa antibiotics.   Social History:  Social History   Tobacco Use   Smoking status: Never   Smokeless tobacco: Never  Vaping Use   Vaping Use: Never used  Substance Use Topics   Alcohol use: Yes    Comment: occas   Drug use: No    Relationship status: divorced She lives with her daughter.   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  Respiratory:  Negative for cough and shortness of breath.   Cardiovascular:  Negative for chest pain and palpitations.  Gastrointestinal:  Negative for abdominal pain, blood in stool, constipation and diarrhea.  Skin:  Negative for rash.  Neurological:  Negative for dizziness, weakness and headaches.  Psychiatric/Behavioral:  Negative for depression and suicidal ideas.      OBJECTIVE Physical Exam: Vitals:   02/02/22 0950  BP: 124/82  Pulse: 68   Weight: 173 lb (78.5 kg)  Height: 5' 5.75" (1.67 m)    Physical Exam Constitutional:      General: She is not in acute distress. Pulmonary:     Effort: Pulmonary effort is normal.  Abdominal:     General: There is no distension.     Palpations: Abdomen is soft.     Tenderness: There is no abdominal tenderness. There is no rebound.  Musculoskeletal:        General: No swelling. Normal range of motion.  Skin:    General: Skin is warm and dry.     Findings: No rash.  Neurological:     Mental Status: She is alert and oriented to person, place, and time.  Psychiatric:        Mood and Affect: Mood normal.        Behavior: Behavior normal.      GU / Detailed Urogynecologic Evaluation:  Pelvic Exam: Normal external female genitalia; Bartholin's and Skene's glands normal in appearance; urethral meatus normal in appearance, no urethral masses or discharge.   CST: negative  Speculum exam reveals normal vaginal mucosa without atrophy. Cervix normal appearance. Uterus normal single, nontender. Adnexa no mass, fullness, tenderness.     Pelvic floor strength I/V  Pelvic floor musculature: Right levator non-tender, Right obturator non-tender, Left levator non-tender, Left obturator non-tender  POP-Q:   POP-Q  -2.5                                            Aa   -2.5                                           Ba  -7                                              C   3                                            Gh  3.5                                            Pb  10  tvl   -3                                            Ap  -3                                            Bp  9.5                                              D      Rectal Exam:  Normal external rectum  Post-Void Residual (PVR) by Bladder Scan: In order to evaluate bladder emptying, we discussed obtaining a postvoid residual and she agreed to this  procedure.  Procedure: The ultrasound unit was placed on the patient's abdomen in the suprapubic region after the patient had voided. A PVR of 19 ml was obtained by bladder scan.  Laboratory Results: POC urine: moderate blood   ASSESSMENT AND PLAN [name] is a 46 y.o. with:  1. SUI (stress urinary incontinence, female)   2. Urinary frequency   3. Hematuria, unspecified type    SUI - For treatment of stress urinary incontinence,  non-surgical options include expectant management, weight loss, physical therapy, as well as a pessary.  Surgical options include a midurethral sling, Burch urethropexy, and transurethral injection of a bulking agent. - She is interested in a sling, will message surgery scheduler to arrange.  - She will need to return for a simple CMG to demonstrate leakage.    2. Blood in urine - will send for UA micro and culture to r/o hematuria  Return for simple CMG  Jaquita Folds, MD

## 2022-02-01 DIAGNOSIS — M25641 Stiffness of right hand, not elsewhere classified: Secondary | ICD-10-CM | POA: Insufficient documentation

## 2022-02-02 ENCOUNTER — Encounter: Payer: Self-pay | Admitting: Obstetrics and Gynecology

## 2022-02-02 ENCOUNTER — Other Ambulatory Visit (HOSPITAL_COMMUNITY)
Admission: RE | Admit: 2022-02-02 | Discharge: 2022-02-02 | Disposition: A | Discharge status: Home / Self Care | Payer: No Typology Code available for payment source | Attending: Obstetrics and Gynecology | Admitting: Obstetrics and Gynecology

## 2022-02-02 ENCOUNTER — Ambulatory Visit (INDEPENDENT_AMBULATORY_CARE_PROVIDER_SITE_OTHER): Payer: No Typology Code available for payment source | Admitting: Obstetrics and Gynecology

## 2022-02-02 VITALS — BP 124/82 | HR 68 | Ht 65.75 in | Wt 173.0 lb

## 2022-02-02 DIAGNOSIS — N393 Stress incontinence (female) (male): Secondary | ICD-10-CM

## 2022-02-02 DIAGNOSIS — R350 Frequency of micturition: Secondary | ICD-10-CM | POA: Diagnosis present

## 2022-02-02 DIAGNOSIS — R319 Hematuria, unspecified: Secondary | ICD-10-CM | POA: Diagnosis not present

## 2022-02-02 LAB — URINALYSIS, ROUTINE W REFLEX MICROSCOPIC
Bilirubin Urine: NEGATIVE
Glucose, UA: NEGATIVE mg/dL
Ketones, ur: 20 mg/dL — AB
Nitrite: NEGATIVE
Protein, ur: 30 mg/dL — AB
Specific Gravity, Urine: 1.028 (ref 1.005–1.030)
pH: 5 (ref 5.0–8.0)

## 2022-02-02 LAB — POCT URINALYSIS DIPSTICK
Bilirubin, UA: NEGATIVE
Glucose, UA: NEGATIVE
Ketones, UA: 40
Nitrite, UA: NEGATIVE
Protein, UA: POSITIVE — AB
Spec Grav, UA: >=1.03 — AB (ref 1.010–1.025)
Urobilinogen, UA: 0.2 E.U./dL
pH, UA: 6 (ref 5.0–8.0)

## 2022-02-03 LAB — URINE CULTURE

## 2022-02-05 NOTE — Progress Notes (Unsigned)
[facility] Return Visit  SUBJECTIVE  History of Present Illness: Anna Kim is a 46 y.o. female seen in follow-up for simple CMG to assess for leaking. Plan at last visit was to obtain a simple CMG and determine need for surgical intervention.     Past Medical History: Patient  has a past medical history of History of hypertension (01/20/2016), HLD (hyperlipidemia), HTN (hypertension), Migraine, Ureteral stone, and Vaginal Pap smear, abnormal.   Past Surgical History: She  has a past surgical history that includes Tonsillectomy (2011).   Medications: She has a current medication list which includes the following prescription(s): amlodipine, cholecalciferol, fluticasone, montelukast, norethindrone acetate-ethinyl estradiol, omeprazole, pravastatin, zinc gluconate, and [DISCONTINUED] calcium carb-cholecalciferol.   Allergies: Patient is allergic to ceftriaxone, erythromycin, and sulfa antibiotics.   Social History: Patient  reports that she has never smoked. She has never used smokeless tobacco. She reports current alcohol use. She reports that she does not use drugs.      OBJECTIVE     Physical Exam: Vitals:   02/06/22 0803  BP: 128/88  Pulse: 65   Gen: No apparent distress, A&O x 3.  Detailed Urogynecologic Evaluation:  Deferred.   Verbal consent was obtained to perform simple CMG procedure:   Urethra was prepped with betadine and a 53F catheter was placed and bladder was drained completely. The bladder was then backfilled with sterile water by gravity.  First sensation: 88 First Desire: 210 Strong Desire: 350: When patient reached this level of fluid in the bladder she experienced bladder spasms which resulted in the catheter being pushed out. Detrusor overactivity was demonstrated. Capacity: 410 Cough stress test was positive. Valsalva stress test was not done.  She was was allowed to void on her own.   Interpretation: CMG showed increased  sensation, and within normal limits cystometric capacity. Findings positive for stress incontinence, positive for detrusor overactivity.       ASSESSMENT AND PLAN    Anna Kim is a 45 y.o. with SUI who is planning a sling.    1. SUI (stress urinary incontinence, female)   SUI:  Patient plans to move forward with surgical planning and sling. Surgery scheduler will call to schedule.   Hematuria: Patient reports she saw her results in my chart but is doing a diet that is high protein low carb which is possibly contributory and she was having some spotting at the time of her urine sample. Denies any UTI symptoms. Culture showed mixed flora.   All questions answered.

## 2022-02-06 ENCOUNTER — Ambulatory Visit (INDEPENDENT_AMBULATORY_CARE_PROVIDER_SITE_OTHER): Payer: No Typology Code available for payment source | Admitting: Obstetrics and Gynecology

## 2022-02-06 ENCOUNTER — Encounter: Payer: Self-pay | Admitting: Obstetrics and Gynecology

## 2022-02-06 VITALS — BP 128/88 | HR 65

## 2022-02-06 DIAGNOSIS — N393 Stress incontinence (female) (male): Secondary | ICD-10-CM

## 2022-02-06 NOTE — Addendum Note (Signed)
Addended by: Berton Mount on: 02/06/2022 11:51 AM   Modules accepted: Level of Service

## 2022-02-09 ENCOUNTER — Encounter: Payer: Self-pay | Admitting: Family Medicine

## 2022-02-09 ENCOUNTER — Ambulatory Visit (INDEPENDENT_AMBULATORY_CARE_PROVIDER_SITE_OTHER): Payer: No Typology Code available for payment source | Admitting: Family Medicine

## 2022-02-09 VITALS — BP 138/82 | HR 77 | Temp 98.0°F | Ht 65.75 in | Wt 171.4 lb

## 2022-02-09 DIAGNOSIS — I10 Essential (primary) hypertension: Secondary | ICD-10-CM | POA: Diagnosis not present

## 2022-02-09 DIAGNOSIS — S63659A Sprain of metacarpophalangeal joint of unspecified finger, initial encounter: Secondary | ICD-10-CM | POA: Insufficient documentation

## 2022-02-09 DIAGNOSIS — E663 Overweight: Secondary | ICD-10-CM

## 2022-02-09 DIAGNOSIS — S63659D Sprain of metacarpophalangeal joint of unspecified finger, subsequent encounter: Secondary | ICD-10-CM | POA: Diagnosis not present

## 2022-02-09 DIAGNOSIS — E785 Hyperlipidemia, unspecified: Secondary | ICD-10-CM

## 2022-02-09 HISTORY — DX: Sprain of metacarpophalangeal joint of unspecified finger, initial encounter: S63.659A

## 2022-02-09 NOTE — Assessment & Plan Note (Addendum)
Continue pravastatin '40mg'$  daily. Will recheck labs at following visit for her physical.

## 2022-02-09 NOTE — Patient Instructions (Signed)
Nice to see you. Please try to check your blood pressure a few times a week.  Your goal blood pressure is less than 140/90.  If you are running above that consistently please let me know.

## 2022-02-09 NOTE — Assessment & Plan Note (Signed)
Well-controlled with amlodipine, will continue '5mg'$  tablet daily. Encouraged patient to check blood pressures at home and to come back if her pressures are higher than 140/90 consistently.

## 2022-02-09 NOTE — Assessment & Plan Note (Signed)
Healing well. Injury was from playing kickball. Patient is followed by ortho and she will see them again in December. Currently wearing a splint and doing physical therapy.

## 2022-02-09 NOTE — Assessment & Plan Note (Signed)
She will continue diet and exercise changes.

## 2022-02-09 NOTE — Progress Notes (Signed)
Patient seen along with medical student Zada Zhong.  I personally evaluated this patient along with the student, and verified all aspects of the history, physical exam, and medical decision making as documented by the student.  I agree with the student's documentation and have made all necessary edits.  Avis Tirone, MD  

## 2022-02-09 NOTE — Progress Notes (Signed)
Tommi Rumps, MD Phone: [phone]  Anna Kim is a 46 y.o. female who presents today for f/u.  Hypertension: patient reports that she takes her amlodipine every night and denies side effects from the medication. She states that she doesn't check her BP at home, but during doctors' visits they tend to be in the 120s/80s. She denies any chest pain, shortness of breath, or edema. She reports lifestyle modifications such as intermittent fasting and exercise program via [employer] and has lost about 12 pounds.   Hyperlipidemia: patient reports taking her pravastatin daily and denies any side effects.  Right thumb ulnar collateral ligament tear: patient is wearing a thumb splint today, states that her cast was removed last week. Patient reports good healing with physical therapy and will follow up with ortho in the first week of December. Has been treated conservatively due to delay in getting to see the appropriate orthopedist.   Social History   Tobacco Use  Smoking Status Never  Smokeless Tobacco Never    Current Outpatient Medications on File Prior to Visit  Medication Sig Dispense Refill   amLODipine (NORVASC) 5 MG tablet Take 1 tablet (5 mg total) by mouth daily. 90 tablet 2   Cholecalciferol 125 MCG (5000 UT) TABS cholecalciferol (vitamin D3) 125 mcg (5,000 unit) tablet     fluticasone (FLONASE) 50 MCG/ACT nasal spray Place 2 sprays into both nostrils daily. 16 g 1   montelukast (SINGULAIR) 10 MG tablet Take 1 tablet (10 mg total) by mouth at bedtime. 30 tablet 3   Norethindrone Acetate-Ethinyl Estradiol (LARIN 1.5/30) 1.5-30 MG-MCG tablet TAKE 1 TABLET BY MOUTH DAILY. 21 tablet 11   omeprazole (PRILOSEC) 20 MG capsule Take 1 capsule (20 mg total) by mouth daily. 30 capsule 3   pravastatin (PRAVACHOL) 40 MG tablet TAKE 1 TABLET BY MOUTH DAILY. 90 tablet 3   zinc gluconate 50 MG tablet Take 50 mg by mouth daily.     [DISCONTINUED] Calcium Carb-Cholecalciferol (CALCIUM + D3 PO)  Take 1 tablet by mouth daily.     No current facility-administered medications on file prior to visit.     ROS see history of present illness  Objective  Vitals:   02/09/22 0952  BP: 138/82  Pulse: 77  Temp: 98 F (36.7 C)  SpO2: 99%    BP Readings from Last 3 Encounters:  02/09/22 138/82  02/06/22 128/88  02/02/22 124/82   Wt Readings from Last 3 Encounters:  02/09/22 171 lb 6.4 oz (77.7 kg)  02/02/22 173 lb (78.5 kg)  08/07/21 183 lb 9.6 oz (83.3 kg)    Physical Exam Constitutional:      Appearance: Normal appearance.  HENT:     Head: Normocephalic and atraumatic.  Cardiovascular:     Rate and Rhythm: Normal rate and regular rhythm.  Pulmonary:     Effort: Pulmonary effort is normal.     Breath sounds: Normal breath sounds.  Skin:    General: Skin is warm and dry.  Neurological:     Mental Status: She is alert.  Psychiatric:        Mood and Affect: Mood normal.      Assessment/Plan: Please see individual problem list.  Problem List Items Addressed This Visit     Hyperlipidemia (Chronic)    Continue pravastatin '40mg'$  daily. Will recheck labs at following visit for her physical.      Hypertension - Primary (Chronic)    Well-controlled with amlodipine, will continue '5mg'$  tablet daily. Encouraged patient to check  blood pressures at home and to come back if her pressures are higher than 140/90 consistently.      Overweight (Chronic)    She will continue diet and exercise changes.       Complete tear of ulnar collateral ligament of metacarpophalangeal (MCP) joint of finger    Healing well. Injury was from playing kickball. Patient is followed by ortho and she will see them again in December. Currently wearing a splint and doing physical therapy.        Return in about 6 months (around 08/10/2022) for CPE.   Tommi Rumps, Medical Student [place]

## 2022-02-16 ENCOUNTER — Other Ambulatory Visit: Payer: Self-pay | Admitting: Certified Nurse Midwife

## 2022-02-18 ENCOUNTER — Other Ambulatory Visit: Payer: Self-pay | Admitting: Certified Nurse Midwife

## 2022-02-18 ENCOUNTER — Other Ambulatory Visit: Payer: Self-pay

## 2022-02-19 ENCOUNTER — Other Ambulatory Visit: Payer: Self-pay

## 2022-03-06 ENCOUNTER — Other Ambulatory Visit: Payer: Self-pay

## 2022-03-07 ENCOUNTER — Other Ambulatory Visit: Payer: Self-pay

## 2022-03-11 ENCOUNTER — Other Ambulatory Visit: Payer: Self-pay | Admitting: Certified Nurse Midwife

## 2022-03-11 ENCOUNTER — Other Ambulatory Visit: Payer: Self-pay

## 2022-03-12 ENCOUNTER — Other Ambulatory Visit: Payer: Self-pay

## 2022-03-12 MED FILL — Norethindrone Ace & Ethinyl Estradiol Tab 1.5 MG-30 MCG: ORAL | 84 days supply | Qty: 84 | Fill #0 | Status: AC

## 2022-03-15 ENCOUNTER — Other Ambulatory Visit: Payer: Self-pay

## 2022-03-27 ENCOUNTER — Encounter: Payer: Self-pay | Admitting: Obstetrics and Gynecology

## 2022-03-27 ENCOUNTER — Ambulatory Visit (INDEPENDENT_AMBULATORY_CARE_PROVIDER_SITE_OTHER): Payer: 59 | Admitting: Obstetrics and Gynecology

## 2022-03-27 ENCOUNTER — Other Ambulatory Visit: Payer: Self-pay

## 2022-03-27 VITALS — BP 135/85 | HR 76 | Wt 172.0 lb

## 2022-03-27 DIAGNOSIS — Z01818 Encounter for other preprocedural examination: Secondary | ICD-10-CM

## 2022-03-27 DIAGNOSIS — N393 Stress incontinence (female) (male): Secondary | ICD-10-CM | POA: Diagnosis not present

## 2022-03-27 MED ORDER — POLYETHYLENE GLYCOL 3350 17 GM/SCOOP PO POWD
17.0000 g | Freq: Every day | ORAL | 0 refills | Status: DC
  Filled 2022-03-27: qty 255, 15d supply, fill #0

## 2022-03-27 MED ORDER — IBUPROFEN 600 MG PO TABS
600.0000 mg | ORAL_TABLET | Freq: Four times a day (QID) | ORAL | 0 refills | Status: DC | PRN
  Filled 2022-03-27: qty 30, 8d supply, fill #0

## 2022-03-27 MED ORDER — ACETAMINOPHEN 500 MG PO TABS
500.0000 mg | ORAL_TABLET | Freq: Four times a day (QID) | ORAL | 0 refills | Status: DC | PRN
  Filled 2022-03-27: qty 30, 8d supply, fill #0

## 2022-03-27 MED ORDER — OXYCODONE HCL 5 MG PO TABS
5.0000 mg | ORAL_TABLET | ORAL | 0 refills | Status: DC | PRN
  Filled 2022-03-27: qty 5, 1d supply, fill #0

## 2022-03-27 NOTE — H&P (Signed)
[facility] Pre-Operative H&P  Subjective Chief Complaint: Anna Kim presents for a preoperative encounter.   History of Present Illness: Anna Kim is a 47 y.o. female who presents for preoperative visit.  She is scheduled to undergo midurethral sling, cystoscopy on 04/05/21.  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  Simple CMG: CMG showed increased sensation, and within normal limits cystometric capacity. Findings positive for stress incontinence, positive for detrusor overactivity.   Past Medical History:  Diagnosis Date   History of hypertension 01/20/2016   HLD (hyperlipidemia)    HTN (hypertension)    Migraine    Ureteral stone    Vaginal Pap smear, abnormal    Remote history of 1 abnormal pap     Past Surgical History:  Procedure Laterality Date   TONSILLECTOMY  2011    is allergic to ceftriaxone, erythromycin, and sulfa antibiotics.   Family History  Problem Relation Age of Onset   Cancer Maternal Aunt        ovarian   Hypertension Mother    Hypertension Father    Atrial fibrillation Father    Kidney cancer Neg Hx    Bladder Cancer Neg Hx    Breast cancer Neg Hx     Social History   Tobacco Use   Smoking status: Never   Smokeless tobacco: Never  Vaping Use   Vaping Use: Never used  Substance Use Topics   Alcohol use: Yes    Comment: occas   Drug use: No     Review of Systems was negative for a full 10 system review except as noted in the History of Present Illness.  No current facility-administered medications for this encounter.  Current Outpatient Medications:    acetaminophen (TYLENOL) 500 MG tablet, Take 1 tablet (500 mg total) by mouth every 6 (six) hours as needed (pain)., Disp: 30 tablet, Rfl: 0   amLODipine (NORVASC) 5 MG tablet, Take 1 tablet (5 mg total) by mouth daily., Disp: 90 tablet, Rfl: 2   Cholecalciferol 125 MCG (5000 UT) TABS,  cholecalciferol (vitamin D3) 125 mcg (5,000 unit) tablet, Disp: , Rfl:    fluticasone (FLONASE) 50 MCG/ACT nasal spray, Place 2 sprays into both nostrils daily., Disp: 16 g, Rfl: 1   ibuprofen (ADVIL) 600 MG tablet, Take 1 tablet (600 mg total) by mouth every 6 (six) hours as needed., Disp: 30 tablet, Rfl: 0   montelukast (SINGULAIR) 10 MG tablet, Take 1 tablet (10 mg total) by mouth at bedtime., Disp: 30 tablet, Rfl: 3   Norethindrone Acetate-Ethinyl Estradiol (LARIN 1.5/30) 1.5-30 MG-MCG tablet, TAKE 1 TABLET BY MOUTH DAILY., Disp: 21 tablet, Rfl: 11   omeprazole (PRILOSEC) 20 MG capsule, Take 1 capsule (20 mg total) by mouth daily., Disp: 30 capsule, Rfl: 3   oxyCODONE (OXY IR/ROXICODONE) 5 MG immediate release tablet, Take 1 tablet (5 mg total) by mouth every 4 (four) hours as needed for severe pain., Disp: 5 tablet, Rfl: 0   polyethylene glycol powder (GLYCOLAX/MIRALAX) 17 GM/SCOOP powder, Take 17 g by mouth daily. Drink 17g (1 scoop) dissolved in water per day., Disp: 255 g, Rfl: 0   pravastatin (PRAVACHOL) 40 MG tablet, TAKE 1 TABLET BY MOUTH DAILY., Disp: 90 tablet, Rfl: 3   zinc gluconate 50 MG tablet, Take 50 mg by mouth daily., Disp: , Rfl:    Objective There were no vitals filed for this visit.   Gen: NAD CV: S1 S2 RRR Lungs:  Clear to auscultation bilaterally Abd: soft, nontender   Previous Pelvic Exam showed: POP-Q   -2.5                                            Aa   -2.5                                           Ba   -7                                              C    3                                            Gh   3.5                                            Pb   10                                            tvl    -3                                            Ap   -3                                            Bp   9.5                                              D       Assessment/ Plan  The patient is a 47 y.o. year old scheduled to undergo  midurethral sling, cystoscopy.    Jaquita Folds, MD

## 2022-03-27 NOTE — Progress Notes (Signed)
[facility] Pre-Operative visit  Subjective Chief Complaint: Anna Kim presents for a preoperative encounter.   History of Present Illness: Anna Kim is a 47 y.o. female who presents for preoperative visit.  She is scheduled to undergo midurethral sling, cystoscopy on 04/05/21.  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  Simple CMG: CMG showed increased sensation, and within normal limits cystometric capacity. Findings positive for stress incontinence, positive for detrusor overactivity.   Past Medical History:  Diagnosis Date   History of hypertension 01/20/2016   HLD (hyperlipidemia)    HTN (hypertension)    Migraine    Ureteral stone    Vaginal Pap smear, abnormal    Remote history of 1 abnormal pap     Past Surgical History:  Procedure Laterality Date   TONSILLECTOMY  2011    is allergic to ceftriaxone, erythromycin, and sulfa antibiotics.   Family History  Problem Relation Age of Onset   Cancer Maternal Aunt        ovarian   Hypertension Mother    Hypertension Father    Atrial fibrillation Father    Kidney cancer Neg Hx    Bladder Cancer Neg Hx    Breast cancer Neg Hx     Social History   Tobacco Use   Smoking status: Never   Smokeless tobacco: Never  Vaping Use   Vaping Use: Never used  Substance Use Topics   Alcohol use: Yes    Comment: occas   Drug use: No     Review of Systems was negative for a full 10 system review except as noted in the History of Present Illness.   Current Outpatient Medications:    amLODipine (NORVASC) 5 MG tablet, Take 1 tablet (5 mg total) by mouth daily., Disp: 90 tablet, Rfl: 2   Cholecalciferol 125 MCG (5000 UT) TABS, cholecalciferol (vitamin D3) 125 mcg (5,000 unit) tablet, Disp: , Rfl:    fluticasone (FLONASE) 50 MCG/ACT nasal spray, Place 2 sprays into both nostrils daily., Disp: 16 g, Rfl: 1   montelukast (SINGULAIR) 10 MG  tablet, Take 1 tablet (10 mg total) by mouth at bedtime., Disp: 30 tablet, Rfl: 3   Norethindrone Acetate-Ethinyl Estradiol (LARIN 1.5/30) 1.5-30 MG-MCG tablet, TAKE 1 TABLET BY MOUTH DAILY., Disp: 21 tablet, Rfl: 11   omeprazole (PRILOSEC) 20 MG capsule, Take 1 capsule (20 mg total) by mouth daily., Disp: 30 capsule, Rfl: 3   pravastatin (PRAVACHOL) 40 MG tablet, TAKE 1 TABLET BY MOUTH DAILY., Disp: 90 tablet, Rfl: 3   zinc gluconate 50 MG tablet, Take 50 mg by mouth daily., Disp: , Rfl:    Objective Vitals:   03/27/22 0832  BP: 135/85  Pulse: 76    Gen: NAD CV: S1 S2 RRR Lungs: Clear to auscultation bilaterally Abd: soft, nontender   Previous Pelvic Exam showed: POP-Q   -2.5                                            Aa   -2.5                                           Ba   -7  C    3                                            Gh   3.5                                            Pb   10                                            tvl    -3                                            Ap   -3                                            Bp   9.5                                              D       Assessment/ Plan  Assessment: The patient is a 47 y.o. year old scheduled to undergo midurethral sling, cystoscopy. Verbal consent was obtained for these procedures.  Plan: General Surgical Consent: The patient has previously been counseled on alternative treatments, and the decision by the patient and provider was to proceed with the procedure listed above.  For all procedures, there are risks of bleeding, infection, damage to surrounding organs including but not limited to bowel, bladder, blood vessels, ureters and nerves, and need for further surgery if an injury were to occur. These risks are all low with minimally invasive surgery.   There are risks of numbness and weakness at any body site or buttock/rectal pain.  It is  possible that baseline pain can be worsened by surgery, either with or without mesh. If surgery is vaginal, there is also a low risk of possible conversion to laparoscopy or open abdominal incision where indicated. Very rare risks include blood transfusion, blood clot, heart attack, pneumonia, or death.   There is also a risk of short-term postoperative urinary retention with need to use a catheter. About half of patients need to go home from surgery with a catheter, which is then later removed in the office. The risk of long-term need for a catheter is very low. There is also a risk of worsening of overactive bladder.   Sling: The effectiveness of a midurethral vaginal mesh sling is approximately 85%, and thus, there will be times when you may leak urine after surgery, especially if your bladder is full or if you have a strong cough. There is a balance between making the sling tight enough to treat your leakage but not too tight so that you have long-term difficulty emptying your bladder. A mesh sling will not directly treat overactive bladder/urge incontinence and  may worsen it.  There is an FDA safety notification on vaginal mesh procedures for prolapse but NOT mesh slings. We have extensive experience and training with mesh placement and we have close postoperative follow up to identify any potential complications from mesh. It is important to realize that this mesh is a permanent implant that cannot be easily removed. There are rare risks of mesh exposure (2-4%), pain with intercourse (0-7%), and infection (<1%). The risk of mesh exposure if more likely in a woman with risks for poor healing (prior radiation, poorly controlled diabetes, or immunocompromised). The risk of new or worsened chronic pain after mesh implant is more common in women with baseline chronic pain and/or poorly controlled anxiety or depression. Approximately 2-4% of patients will experience longer-term post-operative voiding  dysfunction that may require surgical revision of the sling. We also reviewed that postoperatively, her stream may not be as strong as before surgery.    Prolapse (with or without mesh): Risk factors for surgical failure  include things that put pressure on your pelvis and the surgical repair, including obesity, chronic cough, and heavy lifting or straining (including lifting children or adults, straining on the toilet, or lifting heavy objects such as furniture or anything weighing >25 lbs. Risks of recurrence is 20-30% with vaginal native tissue repair and a less than 10% with sacrocolpopexy with mesh.    We discussed consent for blood products. Risks for blood transfusion include allergic reactions, other reactions that can affect different body organs and managed accordingly, transmission of infectious diseases such as HIV or Hepatitis. However, the blood is screened. Patient consents for blood products.  Pre-operative instructions:  She was instructed to not take Aspirin/NSAIDs x 7days prior to surgery.  Antibiotic prophylaxis was ordered as indicated.  Catheter use: Patient will go home with foley if needed after post-operative voiding trial.  Post-operative instructions:  She was provided with specific post-operative instructions, including precautions and signs/symptoms for which we would recommend contacting us, in addition to daytime and after-hours contact phone numbers. This was provided on a handout.   Post-operative medications: Prescriptions for motrin, tylenol, miralax, and oxycodone were sent to her pharmacy. Discussed using ibuprofen and tylenol on a schedule to limit use of narcotics.   Laboratory testing:  No labs needed  Preoperative clearance:  She does not require surgical clearance.    Post-operative follow-up:  A post-operative appointment will be made for 6 weeks from the date of surgery. If she needs a post-operative nurse visit for a voiding trial, that will be set up  after she leaves the hospital.    Patient will call the clinic or use MyChart should anything change or any new issues arise.   Jaquita Folds, MD  Time spent: I spent 20 minutes dedicated to the care of this patient on the date of this encounter to include pre-visit review of records, face-to-face time with the patient  and post visit documentation and ordering medication/ testing.

## 2022-04-03 ENCOUNTER — Encounter (HOSPITAL_BASED_OUTPATIENT_CLINIC_OR_DEPARTMENT_OTHER): Payer: Self-pay | Admitting: Obstetrics and Gynecology

## 2022-04-03 NOTE — Progress Notes (Signed)
Spoke w/ via phone for pre-op interview--- [facility] Lab needs dos----  ISTAT(Gent), UPT, EKG             Lab results------ COVID test -----patient states asymptomatic no test needed Arrive at -------1030 NPO after MN NO Solid Food.  Clear liquids from MN until---0930 Med rec completed Medications to take morning of surgery -----Prilosec Diabetic medication ----- Patient instructed no nail polish to be worn day of surgery Patient instructed to bring photo id and insurance card day of surgery Patient aware to have Driver (ride ) / caregiver  Daughter Ryley  for 24 hours after surgery  Patient Special Instructions ----- Pre-Op special Istructions ----- Patient verbalized understanding of instructions that were given at this phone interview. Patient denies shortness of breath, chest pain, fever, cough at this phone interview.

## 2022-04-05 ENCOUNTER — Telehealth: Payer: Self-pay | Admitting: Obstetrics and Gynecology

## 2022-04-05 ENCOUNTER — Other Ambulatory Visit: Payer: Self-pay

## 2022-04-05 ENCOUNTER — Ambulatory Visit (HOSPITAL_BASED_OUTPATIENT_CLINIC_OR_DEPARTMENT_OTHER): Payer: 59 | Admitting: Anesthesiology

## 2022-04-05 ENCOUNTER — Encounter (HOSPITAL_BASED_OUTPATIENT_CLINIC_OR_DEPARTMENT_OTHER)
Admission: RE | Disposition: A | Discharge status: Home / Self Care | Payer: Self-pay | Source: Home / Self Care | Attending: Obstetrics and Gynecology

## 2022-04-05 ENCOUNTER — Ambulatory Visit (HOSPITAL_BASED_OUTPATIENT_CLINIC_OR_DEPARTMENT_OTHER)
Admission: RE | Admit: 2022-04-05 | Discharge: 2022-04-05 | Disposition: A | Discharge status: Home / Self Care | Payer: 59 | Attending: Obstetrics and Gynecology | Admitting: Obstetrics and Gynecology

## 2022-04-05 ENCOUNTER — Encounter (HOSPITAL_BASED_OUTPATIENT_CLINIC_OR_DEPARTMENT_OTHER): Payer: Self-pay | Admitting: Obstetrics and Gynecology

## 2022-04-05 DIAGNOSIS — Z01818 Encounter for other preprocedural examination: Secondary | ICD-10-CM

## 2022-04-05 DIAGNOSIS — I10 Essential (primary) hypertension: Secondary | ICD-10-CM | POA: Diagnosis not present

## 2022-04-05 DIAGNOSIS — N393 Stress incontinence (female) (male): Secondary | ICD-10-CM | POA: Insufficient documentation

## 2022-04-05 DIAGNOSIS — Z79899 Other long term (current) drug therapy: Secondary | ICD-10-CM | POA: Insufficient documentation

## 2022-04-05 DIAGNOSIS — E785 Hyperlipidemia, unspecified: Secondary | ICD-10-CM | POA: Insufficient documentation

## 2022-04-05 DIAGNOSIS — Z793 Long term (current) use of hormonal contraceptives: Secondary | ICD-10-CM | POA: Insufficient documentation

## 2022-04-05 DIAGNOSIS — Z9889 Other specified postprocedural states: Secondary | ICD-10-CM

## 2022-04-05 DIAGNOSIS — K219 Gastro-esophageal reflux disease without esophagitis: Secondary | ICD-10-CM | POA: Diagnosis not present

## 2022-04-05 HISTORY — PX: CYSTOSCOPY: SHX5120

## 2022-04-05 HISTORY — PX: BLADDER SUSPENSION: SHX72

## 2022-04-05 LAB — POCT PREGNANCY, URINE: Preg Test, Ur: NEGATIVE

## 2022-04-05 LAB — POCT I-STAT, CHEM 8
BUN: 8 mg/dL (ref 6–20)
Calcium, Ion: 1.23 mmol/L (ref 1.15–1.40)
Chloride: 104 mmol/L (ref 98–111)
Creatinine, Ser: 0.7 mg/dL (ref 0.44–1.00)
Glucose, Bld: 100 mg/dL — ABNORMAL HIGH (ref 70–99)
HCT: 38 % (ref 36.0–46.0)
Hemoglobin: 12.9 g/dL (ref 12.0–15.0)
Potassium: 3.6 mmol/L (ref 3.5–5.1)
Sodium: 141 mmol/L (ref 135–145)
TCO2: 23 mmol/L (ref 22–32)

## 2022-04-05 SURGERY — URETHROPEXY, USING TRANSVAGINAL TAPE
Anesthesia: General | Site: Vagina

## 2022-04-05 MED ORDER — PROPOFOL 10 MG/ML IV BOLUS
INTRAVENOUS | Status: DC | PRN
  Administered 2022-04-05: 200 mg via INTRAVENOUS
  Administered 2022-04-05: 50 mg via INTRAVENOUS

## 2022-04-05 MED ORDER — PROPOFOL 10 MG/ML IV BOLUS
INTRAVENOUS | Status: AC
  Filled 2022-04-05: qty 20

## 2022-04-05 MED ORDER — ACETAMINOPHEN 10 MG/ML IV SOLN: 1000.0000 mg | Freq: Once | INTRAVENOUS | Status: DC | PRN

## 2022-04-05 MED ORDER — FENTANYL CITRATE (PF) 100 MCG/2ML IJ SOLN
INTRAMUSCULAR | Status: DC | PRN
  Administered 2022-04-05: 25 ug via INTRAVENOUS
  Administered 2022-04-05: 50 ug via INTRAVENOUS
  Administered 2022-04-05: 25 ug via INTRAVENOUS

## 2022-04-05 MED ORDER — FENTANYL CITRATE (PF) 100 MCG/2ML IJ SOLN: 25.0000 ug | INTRAMUSCULAR | Status: DC | PRN

## 2022-04-05 MED ORDER — MIDAZOLAM HCL 5 MG/5ML IJ SOLN
INTRAMUSCULAR | Status: DC | PRN
  Administered 2022-04-05: 2 mg via INTRAVENOUS

## 2022-04-05 MED ORDER — FENTANYL CITRATE (PF) 100 MCG/2ML IJ SOLN
INTRAMUSCULAR | Status: AC
  Filled 2022-04-05: qty 2

## 2022-04-05 MED ORDER — PHENAZOPYRIDINE HCL 100 MG PO TABS
200.0000 mg | ORAL_TABLET | ORAL | Status: AC
  Administered 2022-04-05: 200 mg via ORAL

## 2022-04-05 MED ORDER — PHENAZOPYRIDINE HCL 100 MG PO TABS
ORAL_TABLET | ORAL | Status: AC
  Filled 2022-04-05: qty 2

## 2022-04-05 MED ORDER — CLINDAMYCIN PHOSPHATE 900 MG/50ML IV SOLN
INTRAVENOUS | Status: AC
  Filled 2022-04-05: qty 50

## 2022-04-05 MED ORDER — DEXAMETHASONE SODIUM PHOSPHATE 10 MG/ML IJ SOLN
INTRAMUSCULAR | Status: AC
  Filled 2022-04-05: qty 1

## 2022-04-05 MED ORDER — SODIUM CHLORIDE 0.9 % IR SOLN
Status: DC | PRN
  Administered 2022-04-05: 1000 mL

## 2022-04-05 MED ORDER — LACTATED RINGERS IV SOLN: INTRAVENOUS | Status: DC

## 2022-04-05 MED ORDER — CLINDAMYCIN PHOSPHATE 900 MG/50ML IV SOLN
900.0000 mg | INTRAVENOUS | Status: AC
  Administered 2022-04-05: 900 mg via INTRAVENOUS

## 2022-04-05 MED ORDER — ONDANSETRON HCL 4 MG/2ML IJ SOLN
INTRAMUSCULAR | Status: DC | PRN
  Administered 2022-04-05: 4 mg via INTRAVENOUS

## 2022-04-05 MED ORDER — KETOROLAC TROMETHAMINE 30 MG/ML IJ SOLN
INTRAMUSCULAR | Status: AC
  Filled 2022-04-05: qty 1

## 2022-04-05 MED ORDER — 0.9 % SODIUM CHLORIDE (POUR BTL) OPTIME
TOPICAL | Status: DC | PRN
  Administered 2022-04-05: 500 mL

## 2022-04-05 MED ORDER — LIDOCAINE HCL (CARDIAC) PF 100 MG/5ML IV SOSY
PREFILLED_SYRINGE | INTRAVENOUS | Status: DC | PRN
  Administered 2022-04-05: 100 mg via INTRAVENOUS

## 2022-04-05 MED ORDER — GENTAMICIN SULFATE 40 MG/ML IJ SOLN
400.0000 mg | INTRAVENOUS | Status: AC
  Administered 2022-04-05: 400 mg via INTRAVENOUS
  Filled 2022-04-05: qty 10

## 2022-04-05 MED ORDER — ONDANSETRON HCL 4 MG/2ML IJ SOLN
INTRAMUSCULAR | Status: AC
  Filled 2022-04-05: qty 2

## 2022-04-05 MED ORDER — LIDOCAINE-EPINEPHRINE 1 %-1:100000 IJ SOLN
INTRAMUSCULAR | Status: DC | PRN
  Administered 2022-04-05: 10 mL

## 2022-04-05 MED ORDER — MIDAZOLAM HCL 2 MG/2ML IJ SOLN
INTRAMUSCULAR | Status: AC
  Filled 2022-04-05: qty 2

## 2022-04-05 MED ORDER — KETOROLAC TROMETHAMINE 30 MG/ML IJ SOLN
INTRAMUSCULAR | Status: DC | PRN
  Administered 2022-04-05: 30 mg via INTRAVENOUS

## 2022-04-05 MED ORDER — DEXAMETHASONE SODIUM PHOSPHATE 4 MG/ML IJ SOLN
INTRAMUSCULAR | Status: DC | PRN
  Administered 2022-04-05: 5 mg via INTRAVENOUS

## 2022-04-05 MED ORDER — POVIDONE-IODINE 10 % EX SWAB: 2.0000 | Freq: Once | CUTANEOUS | Status: DC

## 2022-04-05 SURGICAL SUPPLY — 34 items
ADH SKN CLS APL DERMABOND .7 (GAUZE/BANDAGES/DRESSINGS) ×2
BLADE CLIPPER SENSICLIP SURGIC (BLADE) ×3 IMPLANT
BLADE SURG 15 STRL LF DISP TIS (BLADE) ×3 IMPLANT
BLADE SURG 15 STRL SS (BLADE) ×2
DERMABOND ADVANCED .7 DNX12 (GAUZE/BANDAGES/DRESSINGS) ×3 IMPLANT
ELECT REM PT RETURN 9FT ADLT (ELECTROSURGICAL) ×2
ELECTRODE REM PT RTRN 9FT ADLT (ELECTROSURGICAL) IMPLANT
GLOVE BIOGEL PI IND STRL 6.5 (GLOVE) ×3 IMPLANT
GLOVE BIOGEL PI IND STRL 7.0 (GLOVE) IMPLANT
GLOVE ECLIPSE 6.0 STRL STRAW (GLOVE) ×3 IMPLANT
GLOVE SURG SS PI 6.5 STRL IVOR (GLOVE) IMPLANT
GOWN STRL REUS W/TWL LRG LVL3 (GOWN DISPOSABLE) ×3 IMPLANT
HIBICLENS CHG 4% 4OZ BTL (MISCELLANEOUS) ×3 IMPLANT
HOLDER FOLEY CATH W/STRAP (MISCELLANEOUS) ×3 IMPLANT
IV NS 1000ML (IV SOLUTION) ×2
IV NS 1000ML BAXH (IV SOLUTION) IMPLANT
KIT TURNOVER CYSTO (KITS) ×3 IMPLANT
MANIFOLD NEPTUNE II (INSTRUMENTS) ×3 IMPLANT
NEEDLE HYPO 22GX1.5 SAFETY (NEEDLE) ×3 IMPLANT
NS IRRIG 500ML POUR BTL (IV SOLUTION) IMPLANT
PACK CYSTO (CUSTOM PROCEDURE TRAY) ×3 IMPLANT
PACK VAGINAL WOMENS (CUSTOM PROCEDURE TRAY) ×3 IMPLANT
RETRACTOR LONE STAR DISPOSABLE (INSTRUMENTS) ×3 IMPLANT
RETRACTOR STAY HOOK 5MM (MISCELLANEOUS) ×3 IMPLANT
SET IRRIG Y TYPE TUR BLADDER L (SET/KITS/TRAYS/PACK) ×3 IMPLANT
SPIKE FLUID TRANSFER (MISCELLANEOUS) ×3 IMPLANT
SUCTION FRAZIER HANDLE 10FR (MISCELLANEOUS) ×2
SUCTION TUBE FRAZIER 10FR DISP (MISCELLANEOUS) ×3 IMPLANT
SUT VIC AB 2-0 SH 27 (SUTURE) ×2
SUT VIC AB 2-0 SH 27XBRD (SUTURE) ×3 IMPLANT
SYR BULB EAR ULCER 3OZ GRN STR (SYRINGE) ×3 IMPLANT
SYS SLING ADV FIT BLUE TRNSVAG (Sling) IMPLANT
TOWEL OR 17X26 10 PK STRL BLUE (TOWEL DISPOSABLE) ×3 IMPLANT
TRAY FOLEY W/BAG SLVR 14FR (SET/KITS/TRAYS/PACK) ×3 IMPLANT

## 2022-04-05 NOTE — Op Note (Signed)
Operative Note  Preoperative Diagnosis: stress urinary incontinence  Postoperative Diagnosis: same  Procedures performed:  Midurethral sling (Advantage Fit), cystoscopy  Implants:  Implant Name Type Inv. Item Serial No. Manufacturer Lot No. LRB No. Used Action  SYS SLING ADV FIT BLUE TRNSVAG - [id number] Sling SYS SLING ADV FIT [place] [id number] N/A 1 Implanted    Attending Surgeon: Sherlene Shams, MD  Anesthesia: General LMA  Findings: On cystoscopy, normal bladder and urethra without injury, lesion or foreign body. Brisk bilateral ureteral efflux.   Specimens: * No specimens in log *  Estimated blood loss: 30 mL  IV fluids: 1200 mL  Urine output: 10 mL  Complications: none  Procedure in Detail:  After informed consent was obtained, the patient was taken to the operating room where she was placed under anesthesia.  She was then placed in the dorsal lithotomy position with allen stirrups,  and prepped and draped in the usual sterile fashion.  A strap was placed across her upper abdomen on top of her gown so it was not in direct contact with her skin.  Care was taken to avoid hyperflexion or hyperextension of her upper and lower extremities. A foley catheter was placed.  A lonestar self-retraining retractor was placed with 4 stay hooks. The mid urethral area was located on the anterior vaginal wall.  Two Allis clamps were placed at the level of the midurethra. 1% lidocaine with epinephrine was injected into the vaginal mucosa. A vertical incision was made between the two clamps using a 15-blade scalpel.  Using sharp dissection, Metzenbaum scissors were used to make a periurethral tunnel from the vaginal incision towards the pubic rami bilaterally for the future sling tracts. The bladder was ensured to be empty. The trocar and attached sling were introduced into the right side of the periurethral vaginal incision, just inferior to the pubic symphysis on the right  side. The trocar was guided through the endopelvic fascia and directly vertically.  While hugging the cephalad surface of the pubic bone, the trocar was guided out through the abdomen 2 fingerbreadths lateral to midline at the level of the pubic symphysis on the ipsilateral side. The trocar was placed on the left side in a similar fashion.  A 70-degree cystoscope was introduced, and 360-degree inspection revealed no trauma or trocars in the bladder, with brisk bilateral ureteral efflux.  The bladder was drained and the cystoscope was removed.  The Foley catheter was reinserted.  The sling was brought to lie beneath the mid-urethra.  A needle driver was placed behind the sling to ensure no tension.   The plastic sheath was removed from the sling and the distal ends of the sling were trimmed just below the level of the skin incisions.  Tension-free positioning of the sling was confirmed. Vaginal inspection revealed no vaginotomy or sling perforations of the mucosa.  The vaginal mucosal edges were reapproximated using 2-0 Vicryl.  The vagina was copiously irrigated.  Hemostasis was again noted. Vaginal packing not placed. The suprapubic sling incisions were closed with Dermabond. The patient tolerated the procedure well.  She was awakened from anesthesia and transferred to the recovery room in stable condition. All needle and sponge counts were correct x 2.    Jaquita Folds, MD

## 2022-04-05 NOTE — Transfer of Care (Signed)
Immediate Anesthesia Transfer of Care Note  Patient: Anna Kim  Procedure(s) Performed: Procedure(s) (LRB): TRANSVAGINAL TAPE (TVT) PROCEDURE (N/A) CYSTOSCOPY (N/A)  Patient Location: PACU  Anesthesia Type: General  Level of Consciousness: awake, sedated, patient cooperative and responds to stimulation  Airway & Oxygen Therapy: Patient Spontanous Breathing and Patient connected to [place] oxygen  Post-op Assessment: Report given to PACU RN, Post -op Vital signs reviewed and stable and Patient moving all extremities  Post vital signs: Reviewed and stable  Complications: No apparent anesthesia complications

## 2022-04-05 NOTE — Interval H&P Note (Signed)
History and Physical Interval Note:  04/05/2022 12:11 PM  Anna Kim  has presented today for surgery, with the diagnosis of stress urinary incontinence.  The various methods of treatment have been discussed with the patient and family. After consideration of risks, benefits and other options for treatment, the patient has consented to  Procedure(s) with comments: TRANSVAGINAL TAPE (TVT) PROCEDURE (N/A)  CYSTOSCOPY (N/A) as a surgical intervention.  The patient's history has been reviewed, patient examined, no change in status, stable for surgery.  I have reviewed the patient's chart and labs.  Questions were answered to the patient's satisfaction.     Jaquita Folds

## 2022-04-05 NOTE — Telephone Encounter (Signed)
Anna Kim underwent sling and cystoscopy on 04/05/22.   She passed her voiding trial.  239m was backfilled into the bladder Voided 2251m PVR by bladder scan was 5024m  She was discharged without a catheter. Please call her for a routine post op check. Thanks!  MicJaquita FoldsD

## 2022-04-05 NOTE — Anesthesia Procedure Notes (Signed)
Procedure Name: LMA Insertion Date/Time: 04/05/2022 12:32 PM  Performed by: Justice Rocher, CRNAPre-anesthesia Checklist: Patient identified, Emergency Drugs available, Suction available, Patient being monitored and Timeout performed Patient Re-evaluated:Patient Re-evaluated prior to induction Oxygen Delivery Method: Circle system utilized Preoxygenation: Pre-oxygenation with 100% oxygen Induction Type: IV induction Ventilation: Mask ventilation without difficulty LMA: LMA inserted LMA Size: 4.0 Number of attempts: 1 Airway Equipment and Method: Bite block Placement Confirmation: positive ETCO2, breath sounds checked- equal and bilateral and CO2 detector Tube secured with: Tape Dental Injury: Teeth and Oropharynx as per pre-operative assessment

## 2022-04-05 NOTE — Discharge Instructions (Addendum)
POST OPERATIVE INSTRUCTIONS  General Instructions Recovery (not bed rest) will last approximately 6 weeks Walking is encouraged, but refrain from strenuous exercise/ housework/ heavy lifting for 2 weeks. No lifting >10lbs  Nothing in the vagina- NO intercourse, tampons or douching for 6 weeks Bathing:  Do not submerge in water (NO swimming, bath, hot tub, etc) until after your postop visit. You can shower starting the day after surgery.  No driving until you are not taking narcotic pain medicine and until your pain is well enough controlled that you can slam on the breaks or make sudden movements if needed.   Taking your medications Please take your acetaminophen and ibuprofen on a schedule for the first 48 hours. Take '600mg'$  ibuprofen, then take '500mg'$  acetaminophen 3 hours later, then continue to alternate ibuprofen and acetaminophen. That way you are taking each type of medication every 6 hours. Take the prescribed narcotic (oxycodone, tramadol, etc) as needed, with a maximum being every 4 hours.  Take a stool softener daily to keep your stools soft and preventing you from straining. If you have diarrhea, you decrease your stool softener. This is explained more below. We have prescribed you Miralax.  Reasons to Call the Nurse (see last page for phone numbers) Heavy Bleeding (changing your pad every 1-2 hours) Persistent nausea/vomiting Fever (100.4 degrees or more) Incision problems (pus or other fluid coming out, redness, warmth, increased pain)  Things to Expect After Surgery Mild to Moderate pain is normal during the first day or two after surgery. If prescribed, take Ibuprofen or Tylenol first and use the stronger medicine for "break-through" pain. You can overlap these medicines because they work differently.   Constipation   To Prevent Constipation:  Eat a well-balanced diet including protein, grains, fresh fruit and vegetables.  Drink plenty of fluids. Walk regularly.  Depending on  specific instructions from your physician: take Miralax daily and additionally you can add a stool softener (colace/ docusate) and fiber supplement. Continue as long as you're on pain medications.   To Treat Constipation:  If you do not have a bowel movement in 2 days after surgery, you can take 2 Tbs of Milk of Magnesia 1-2 times a day until you have a bowel movement. If diarrhea occurs, decrease the amount or stop the laxative. If no results with Milk of Magnesia, you can drink a bottle of magnesium citrate which you can purchase over the counter.  Fatigue:  This is a normal response to surgery and will improve with time.  Plan frequent rest periods throughout the day.  Gas Pain:  This is very common but can also be very painful! Drink warm liquids such as herbal teas, bouillon or soup. Walking will help you pass more gas.  Mylicon or Gas-X can be taken over the counter.  Leaking Urine:  Varying amounts of leakage may occur after surgery.  This should improve with time. Your bladder needs at least 3 months to recover from surgery. If you leak after surgery, be sure to mention this to your doctor at your post-op visit. If you were taking medications for overactive bladder prior to surgery, be sure to restart the medications immediately after surgery.  Incisions: If you have incisions on your abdomen, the skin glue will dissolve on its own over time. It is ok to gently rinse with soap and water over these incisions but do not scrub.  Catheter Approximately 50% of patients are unable to urinate after surgery and need to go home with a  catheter. This allows your bladder to rest so it can return to full function. If you go home with a catheter, the office will call to set up a voiding trial a few days after surgery. For most patients, by this visit, they are able to urinate on their own. Long term catheter use is rare.   Return to Work  As work demands and recovery times vary widely, it is hard to  predict when you will want to return to work. If you have a desk job with no strenuous physical activity, and if you would like to return sooner than generally recommended, discuss this with your provider or call our office.   Post op concerns  For non-emergent issues, please call the Urogynecology Nurse. Please leave a message and someone will contact you within one business day.  You can also send a message through [facility].   AFTER HOURS (After 5:00 PM and on weekends):  For urgent matters that cannot wait until the next business day. Call our office [phone] and connect to the doctor on call.  Please reserve this for important issues.   **FOR ANY TRUE EMERGENCY ISSUES CALL 911 OR GO TO THE NEAREST EMERGENCY ROOM.** Please inform our office or the doctor on call of any emergency.     APPOINTMENTS: Call [phone]     Post Anesthesia Home Care Instructions  Activity: Get plenty of rest for the remainder of the day. A responsible individual must stay with you for 24 hours following the procedure.  For the next 24 hours, DO NOT: -Drive a car -Paediatric nurse -Drink alcoholic beverages -Take any medication unless instructed by your physician -Make any legal decisions or sign important papers.  Meals: Start with liquid foods such as gelatin or soup. Progress to regular foods as tolerated. Avoid greasy, spicy, heavy foods. If nausea and/or vomiting occur, drink only clear liquids until the nausea and/or vomiting subsides. Call your physician if vomiting continues.  Special Instructions/Symptoms: Your throat may feel dry or sore from the anesthesia or the breathing tube placed in your throat during surgery. If this causes discomfort, gargle with warm salt water. The discomfort should disappear within 24 hours.  If you had a scopolamine patch placed behind your ear for the management of post- operative nausea and/or vomiting:  1. The medication in the patch is effective for 72  hours, after which it should be removed.  Wrap patch in a tissue and discard in the trash. Wash hands thoroughly with soap and water. 2. You may remove the patch earlier than 72 hours if you experience unpleasant side effects which may include dry mouth, dizziness or visual disturbances. 3. Avoid touching the patch. Wash your hands with soap and water after contact with the patch.

## 2022-04-05 NOTE — Anesthesia Preprocedure Evaluation (Signed)
Anesthesia Evaluation  Patient identified by MRN, date of birth, ID band Patient awake    Reviewed: Allergy & Precautions, NPO status , Patient's Chart, lab work & pertinent test results  Airway Mallampati: II  TM Distance: >3 FB Neck ROM: Full    Dental no notable dental hx.    Pulmonary neg pulmonary ROS   Pulmonary exam normal        Cardiovascular hypertension, Pt. on medications  Rhythm:Regular Rate:Normal     Neuro/Psych  Headaches  negative psych ROS   GI/Hepatic Neg liver ROS,GERD  Medicated,,  Endo/Other  negative endocrine ROS    Renal/GU   Female GU complaint     Musculoskeletal negative musculoskeletal ROS (+)    Abdominal Normal abdominal exam  (+)   Peds  Hematology negative hematology ROS (+)   Anesthesia Other Findings   Reproductive/Obstetrics                             Anesthesia Physical Anesthesia Plan  ASA: 2  Anesthesia Plan: General   Post-op Pain Management:    Induction: Intravenous  PONV Risk Score and Plan: 3 and Ondansetron, Dexamethasone, Midazolam and Treatment may vary due to age or medical condition  Airway Management Planned: Mask and LMA  Additional Equipment: None  Intra-op Plan:   Post-operative Plan: Extubation in OR  Informed Consent: I have reviewed the patients History and Physical, chart, labs and discussed the procedure including the risks, benefits and alternatives for the proposed anesthesia with the patient or authorized representative who has indicated his/her understanding and acceptance.     Dental advisory given  Plan Discussed with: CRNA  Anesthesia Plan Comments:        Anesthesia Quick Evaluation

## 2022-04-06 NOTE — Anesthesia Postprocedure Evaluation (Signed)
Anesthesia Post Note  Patient: Anna Kim  Procedure(s) Performed: TRANSVAGINAL TAPE (TVT) PROCEDURE (Vagina ) CYSTOSCOPY (Urethra)     Patient location during evaluation: PACU Anesthesia Type: General Level of consciousness: awake and alert Pain management: pain level controlled Vital Signs Assessment: post-procedure vital signs reviewed and stable Respiratory status: spontaneous breathing, nonlabored ventilation, respiratory function stable and patient connected to nasal cannula oxygen Cardiovascular status: blood pressure returned to baseline and stable Postop Assessment: no apparent nausea or vomiting Anesthetic complications: no   No notable events documented.  Last Vitals:  Vitals:   04/05/22 1346 04/05/22 1430  BP: 129/75 125/75  Pulse: 79 77  Resp: 15 16  Temp:  36.8 C  SpO2: 99% 99%    Last Pain:  Vitals:   04/06/22 1529  TempSrc:   PainSc: 2                  March Rummage Develle Sievers

## 2022-04-06 NOTE — Telephone Encounter (Signed)
Called and spoke to patient, she reports she is doing well.  She had a BM yesterday before surgery.  Reports she has had some pulling in the area of the sutures but not pain. She reports some mild discomfort but has been taking tylenol and ibuprofen alternating. Has not needed to use the narcotic pain medication.  Reports she has been able to urinate without difficulty.  States she will call or message with any concerns.

## 2022-04-09 ENCOUNTER — Encounter (HOSPITAL_BASED_OUTPATIENT_CLINIC_OR_DEPARTMENT_OTHER): Payer: Self-pay | Admitting: Obstetrics and Gynecology

## 2022-04-09 ENCOUNTER — Other Ambulatory Visit: Payer: Self-pay

## 2022-04-09 ENCOUNTER — Other Ambulatory Visit: Payer: Self-pay | Admitting: Family Medicine

## 2022-04-09 DIAGNOSIS — R12 Heartburn: Secondary | ICD-10-CM

## 2022-04-09 MED ORDER — OMEPRAZOLE 20 MG PO CPDR
20.0000 mg | DELAYED_RELEASE_CAPSULE | Freq: Every day | ORAL | 5 refills | Status: DC
  Filled 2022-04-09: qty 30, 30d supply, fill #0
  Filled 2022-05-09: qty 30, 30d supply, fill #1
  Filled 2022-06-11: qty 30, 30d supply, fill #2
  Filled 2022-07-11: qty 30, 30d supply, fill #3
  Filled 2022-08-11: qty 30, 30d supply, fill #4
  Filled 2022-09-09: qty 30, 30d supply, fill #5

## 2022-04-15 ENCOUNTER — Other Ambulatory Visit: Payer: Self-pay | Admitting: Family Medicine

## 2022-04-15 DIAGNOSIS — J309 Allergic rhinitis, unspecified: Secondary | ICD-10-CM

## 2022-04-15 DIAGNOSIS — E785 Hyperlipidemia, unspecified: Secondary | ICD-10-CM

## 2022-04-16 ENCOUNTER — Other Ambulatory Visit: Payer: Self-pay

## 2022-04-16 MED ORDER — MONTELUKAST SODIUM 10 MG PO TABS
10.0000 mg | ORAL_TABLET | Freq: Every day | ORAL | 3 refills | Status: DC
  Filled 2022-04-16: qty 30, 30d supply, fill #0
  Filled 2022-06-11: qty 30, 30d supply, fill #1
  Filled 2022-07-11: qty 30, 30d supply, fill #2
  Filled 2022-08-11: qty 30, 30d supply, fill #3

## 2022-04-16 MED ORDER — PRAVASTATIN SODIUM 40 MG PO TABS
40.0000 mg | ORAL_TABLET | Freq: Every day | ORAL | 3 refills | Status: DC
  Filled 2022-04-16: qty 90, 90d supply, fill #0
  Filled 2022-07-21: qty 90, 90d supply, fill #1
  Filled 2022-10-23: qty 90, 90d supply, fill #2
  Filled 2023-01-18: qty 90, 90d supply, fill #3

## 2022-05-07 ENCOUNTER — Encounter: Payer: Self-pay | Admitting: Certified Nurse Midwife

## 2022-05-08 ENCOUNTER — Ambulatory Visit: Payer: 59 | Admitting: Certified Nurse Midwife

## 2022-05-14 ENCOUNTER — Encounter: Payer: Self-pay | Admitting: *Deleted

## 2022-05-18 ENCOUNTER — Ambulatory Visit (INDEPENDENT_AMBULATORY_CARE_PROVIDER_SITE_OTHER): Payer: 59 | Admitting: Obstetrics and Gynecology

## 2022-05-18 ENCOUNTER — Encounter: Payer: Self-pay | Admitting: Obstetrics and Gynecology

## 2022-05-18 VITALS — BP 115/78 | HR 68

## 2022-05-18 DIAGNOSIS — N3281 Overactive bladder: Secondary | ICD-10-CM

## 2022-05-18 DIAGNOSIS — N393 Stress incontinence (female) (male): Secondary | ICD-10-CM

## 2022-05-18 NOTE — Progress Notes (Signed)
Anna Kim  Date of Visit: 05/18/2022  History of Present Illness: Anna Kim is a 47 y.o. female scheduled today for a post-operative visit.   Surgery: s/p Midurethral sling ([employer]vantage Fit), cystoscopy on 04/05/22  She passed her postoperative void trial.   Postoperative course has been uncomplicated.   Today she reports has noticed some leakage but not sure when it is happening. Stream has been slower but she feels she is emptying well. Does not leak with cough or sneeze that she has noticed. Has been getting up less in the middle of th enight.   UTI in the last 6 weeks? No  Pain? No  She has returned to her normal activity (except for postop restrictions) Vaginal bulge? No  Stress incontinence: No  Urgency/frequency: No  Urge incontinence:  maybe Voiding dysfunction: No  Bowel issues: No    Medications: She has a current medication list which includes the following prescription(s): amlodipine, cholecalciferol, fluticasone, levocetirizine, montelukast, larin 1.5/30, omeprazole, pravastatin, zinc gluconate, and [DISCONTINUED] calcium carb-cholecalciferol.   Allergies: Patient is allergic to ceftriaxone, erythromycin, and sulfa antibiotics.   Physical Exam: BP 115/78   Pulse 68    Suprapubic Incisions: healing well.  Pelvic Examination: Vagina: Incisions healing well. Sutures are present at incision line and there is not granulation tissue. No visible or palpable mesh.  POP-Q: deferred  ---------------------------------------------------------  Assessment and Plan:  1. SUI (stress urinary incontinence, female)   2. Overactive bladder     - Can resume regular activity including exercise and intercourse,  if desired.  - We discussed that she still may see some improvement in the next few weeks of her symptoms. Can assess further if leakage is related to SUI once she becomes more active.  - Suspect she may have some OAB, which was seen on her simple CMG  prior to surgery. We discussed option of medication but she would like to wait a little longer to see if symptoms improve.  - She will notify us if she feels she need another appointment for further treatment, can also send my chart messages with update on symptoms.   Return as needed  Jaquita Folds, MD

## 2022-05-23 ENCOUNTER — Encounter: Payer: Self-pay | Admitting: Certified Nurse Midwife

## 2022-05-23 ENCOUNTER — Ambulatory Visit (INDEPENDENT_AMBULATORY_CARE_PROVIDER_SITE_OTHER): Payer: 59 | Admitting: Certified Nurse Midwife

## 2022-05-23 VITALS — BP 120/79 | HR 69 | Resp 15 | Ht 66.0 in | Wt 177.4 lb

## 2022-05-23 DIAGNOSIS — Z1211 Encounter for screening for malignant neoplasm of colon: Secondary | ICD-10-CM

## 2022-05-23 DIAGNOSIS — Z01419 Encounter for gynecological examination (general) (routine) without abnormal findings: Secondary | ICD-10-CM

## 2022-05-23 DIAGNOSIS — Z1231 Encounter for screening mammogram for malignant neoplasm of breast: Secondary | ICD-10-CM

## 2022-05-23 NOTE — Progress Notes (Signed)
GYNECOLOGY ANNUAL PREVENTATIVE CARE ENCOUNTER NOTE  History:     Anna Kim is a 47 y.o. G35P1001 female here for a routine annual gynecologic exam.  Current complaints: none.   Denies abnormal vaginal bleeding, discharge, pelvic pain, problems with intercourse or other gynecologic concerns.     Social Relationship:divorced Living: with daughter Work: Clinical biochemist Exercise: 3 x wk  Smoke/Alcohol/drug use: rare alcohol use, denies smoking/drug use  Gynecologic History No LMP recorded (within weeks). (Menstrual status: Oral contraceptives). Contraception: OCP (estrogen/progesterone), for cycle control  Last Pap: 04/25/2020. Results were: normal with negative HPV Last mammogram: 09/25/2021. Results were: normal  Obstetric History OB History  Gravida Para Term Preterm AB Living  '1 1 1     1  '$ SAB IAB Ectopic Multiple Live Births          1    # Outcome Date GA Lbr Len/2nd Weight Sex Delivery Anes PTL Lv  1 Term 09/23/04 [redacted]w[redacted]d 8 lb (3.629 kg) F Vag-Spont  N LIV    Past Medical History:  Diagnosis Date   History of hypertension 01/20/2016   HLD (hyperlipidemia)    HTN (hypertension)    Migraine    Ureteral stone    Vaginal Pap smear, abnormal    Remote history of 1 abnormal pap    Past Surgical History:  Procedure Laterality Date   BLADDER SUSPENSION N/A 04/05/2022   Procedure: TRANSVAGINAL TAPE (TVT) PROCEDURE;  Surgeon: SJaquita Folds MD;  [facility]  Service: Gynecology;  Laterality: N/A;  total time requested is 1 hour   CYSTOSCOPY N/A 04/05/2022   Procedure: CYSTOSCOPY;  Surgeon: SJaquita Folds MD;  [facility]  Service: Gynecology;  Laterality: N/A;   TONSILLECTOMY  2011    Current Outpatient Medications on File Prior to Visit  Medication Sig Dispense Refill   amLODipine (NORVASC) 5 MG tablet Take 1 tablet (5 mg total) by mouth daily. 90 tablet 2   Cholecalciferol 125 MCG  (5000 UT) TABS cholecalciferol (vitamin D3) 125 mcg (5,000 unit) tablet     fluticasone (FLONASE) 50 MCG/ACT nasal spray Place 2 sprays into both nostrils daily. 16 g 1   levocetirizine (XYZAL) 5 MG tablet Take 5 mg by mouth every evening.     montelukast (SINGULAIR) 10 MG tablet Take 1 tablet (10 mg total) by mouth at bedtime. 30 tablet 3   Norethindrone Acetate-Ethinyl Estradiol (LARIN 1.5/30) 1.5-30 MG-MCG tablet TAKE 1 TABLET BY MOUTH DAILY. 21 tablet 11   omeprazole (PRILOSEC) 20 MG capsule Take 1 capsule (20 mg total) by mouth daily. 30 capsule 5   pravastatin (PRAVACHOL) 40 MG tablet Take 1 tablet (40 mg total) by mouth daily. 90 tablet 3   zinc gluconate 50 MG tablet Take 50 mg by mouth daily.     [DISCONTINUED] Calcium Carb-Cholecalciferol (CALCIUM + D3 PO) Take 1 tablet by mouth daily.     No current facility-administered medications on file prior to visit.    Allergies  Allergen Reactions   Ceftriaxone Hives   Erythromycin Other (See Comments)    GI Upset   Sulfa Antibiotics Rash    Social History:  reports that she has never smoked. She has never used smokeless tobacco. She reports current alcohol use. She reports that she does not use drugs.  Family History  Problem Relation Age of Onset   Cancer Maternal Aunt        ovarian  Hypertension Mother    Hypertension Father    Atrial fibrillation Father    Kidney cancer Neg Hx    Bladder Cancer Neg Hx    Breast cancer Neg Hx     The following portions of the patient's history were reviewed and updated as appropriate: allergies, current medications, past family history, past medical history, past social history, past surgical history and problem list.  Review of Systems Pertinent items noted in HPI and remainder of comprehensive ROS otherwise negative.  Physical Exam:  BP 120/79   Pulse 69   Resp 15   Ht '5\' 6"'$  (1.676 m)   Wt 177 lb 6.4 oz (80.5 kg)   LMP  (Within Weeks)   BMI 28.63 kg/m  CONSTITUTIONAL:  Well-developed, well-nourished female in no acute distress.  HENT:  Normocephalic, atraumatic, External right and left ear normal. Oropharynx is clear and moist EYES: Conjunctivae and EOM are normal. Pupils are equal, round, and reactive to light. No scleral icterus.  NECK: Normal range of motion, supple, no masses.  Normal thyroid.  SKIN: Skin is warm and dry. No rash noted. Not diaphoretic. No erythema. No pallor. MUSCULOSKELETAL: Normal range of motion. No tenderness.  No cyanosis, clubbing, or edema.  2+ distal pulses. NEUROLOGIC: Alert and oriented to person, place, and time. Normal reflexes, muscle tone coordination.  PSYCHIATRIC: Normal mood and affect. Normal behavior. Normal judgment and thought content. CARDIOVASCULAR: Normal heart rate noted, regular rhythm RESPIRATORY: Clear to auscultation bilaterally. Effort and breath sounds normal, no problems with respiration noted. BREASTS: Symmetric in size. No masses, tenderness, skin changes, nipple drainage, or lymphadenopathy bilaterally.  ABDOMEN: Soft, no distention noted.  No tenderness, rebound or guarding.  PELVIC: Normal appearing external genitalia and urethral meatus; normal appearing vaginal mucosa and cervix.  No abnormal discharge noted.  Pap smear not due.  Normal uterine size, no other palpable masses, no uterine or adnexal tenderness.  .   Assessment and Plan:    1. Women's annual routine gynecological examination    Pap: not due  Mammogram : ordered  Labs:none Refills: ocp, discussed risk she verbalizes understanding Referral: GI for colonoscopy  Routine preventative health maintenance measures emphasized. Please refer to After Visit Summary for other counseling recommendations.      Philip Aspen, [facility]  [facility]

## 2022-05-23 NOTE — Patient Instructions (Signed)
Preventive Care 40-49 Years Old, Female Preventive care refers to lifestyle choices and visits with your health care provider that can promote health and wellness. Preventive care visits are also called wellness exams. What can I expect for my preventive care visit? Counseling Your health care provider may ask you questions about your: Medical history, including: Past medical problems. Family medical history. Pregnancy history. Current health, including: Menstrual cycle. Method of birth control. Emotional well-being. Home life and relationship well-being. Sexual activity and sexual health. Lifestyle, including: Alcohol, nicotine or tobacco, and drug use. Access to firearms. Diet, exercise, and sleep habits. Work and work environment. Sunscreen use. Safety issues such as seatbelt and bike helmet use. Physical exam Your health care provider will check your: Height and weight. These may be used to calculate your BMI (body mass index). BMI is a measurement that tells if you are at a healthy weight. Waist circumference. This measures the distance around your waistline. This measurement also tells if you are at a healthy weight and may help predict your risk of certain diseases, such as type 2 diabetes and high blood pressure. Heart rate and blood pressure. Body temperature. Skin for abnormal spots. What immunizations do I need?  Vaccines are usually given at various ages, according to a schedule. Your health care provider will recommend vaccines for you based on your age, medical history, and lifestyle or other factors, such as travel or where you work. What tests do I need? Screening Your health care provider may recommend screening tests for certain conditions. This may include: Lipid and cholesterol levels. Diabetes screening. This is done by checking your blood sugar (glucose) after you have not eaten for a while (fasting). Pelvic exam and Pap test. Hepatitis B test. Hepatitis C  test. HIV (human immunodeficiency virus) test. STI (sexually transmitted infection) testing, if you are at risk. Lung cancer screening. Colorectal cancer screening. Mammogram. Talk with your health care provider about when you should start having regular mammograms. This may depend on whether you have a family history of breast cancer. BRCA-related cancer screening. This may be done if you have a family history of breast, ovarian, tubal, or peritoneal cancers. Bone density scan. This is done to screen for osteoporosis. Talk with your health care provider about your test results, treatment options, and if necessary, the need for more tests. Follow these instructions at home: Eating and drinking  Eat a diet that includes fresh fruits and vegetables, whole grains, lean protein, and low-fat dairy products. Take vitamin and mineral supplements as recommended by your health care provider. Do not drink alcohol if: Your health care provider tells you not to drink. You are pregnant, may be pregnant, or are planning to become pregnant. If you drink alcohol: Limit how much you have to 0-1 drink a day. Know how much alcohol is in your drink. In the [place] one drink equals one 12 oz bottle of beer (355 mL), one 5 oz glass of wine (148 mL), or one 1 oz glass of hard liquor (44 mL). Lifestyle Brush your teeth every morning and night with fluoride toothpaste. Floss one time each day. Exercise for at least 30 minutes 5 or more days each week. Do not use any products that contain nicotine or tobacco. These products include cigarettes, chewing tobacco, and vaping devices, such as e-cigarettes. If you need help quitting, ask your health care provider. Do not use drugs. If you are sexually active, practice safe sex. Use a condom or other form of protection to   prevent STIs. If you do not wish to become pregnant, use a form of birth control. If you plan to become pregnant, see your health care provider for a  prepregnancy visit. Take aspirin only as told by your health care provider. Make sure that you understand how much to take and what form to take. Work with your health care provider to find out whether it is safe and beneficial for you to take aspirin daily. Find healthy ways to manage stress, such as: Meditation, yoga, or listening to music. Journaling. Talking to a trusted person. Spending time with friends and family. Minimize exposure to UV radiation to reduce your risk of skin cancer. Safety Always wear your seat belt while driving or riding in a vehicle. Do not drive: If you have been drinking alcohol. Do not ride with someone who has been drinking. When you are tired or distracted. While texting. If you have been using any mind-altering substances or drugs. Wear a helmet and other protective equipment during sports activities. If you have firearms in your house, make sure you follow all gun safety procedures. Seek help if you have been physically or sexually abused. What's next? Visit your health care provider once a year for an annual wellness visit. Ask your health care provider how often you should have your eyes and teeth checked. Stay up to date on all vaccines. This information is not intended to replace advice given to you by your health care provider. Make sure you discuss any questions you have with your health care provider. Document Revised: 09/07/2020 Document Reviewed: 09/07/2020 [employer]vier Patient Education  2023 [employer]  

## 2022-05-29 ENCOUNTER — Other Ambulatory Visit: Payer: Self-pay | Admitting: *Deleted

## 2022-05-29 ENCOUNTER — Other Ambulatory Visit: Payer: Self-pay

## 2022-05-29 ENCOUNTER — Telehealth: Payer: Self-pay | Admitting: *Deleted

## 2022-05-29 DIAGNOSIS — Z1211 Encounter for screening for malignant neoplasm of colon: Secondary | ICD-10-CM

## 2022-05-29 MED ORDER — NA SULFATE-K SULFATE-MG SULF 17.5-3.13-1.6 GM/177ML PO SOLN
1.0000 | Freq: Once | ORAL | 0 refills | Status: AC
  Filled 2022-05-29: qty 354, 1d supply, fill #0

## 2022-05-29 NOTE — Telephone Encounter (Signed)
Gastroenterology Pre-Procedure Review  Request Date: 07/13/2022 Requesting Physician: Dr. Allen Norris  PATIENT REVIEW QUESTIONS: The patient responded to the following health history questions as indicated:    1. Are you having any GI issues? no 2. Do you have a personal history of Polyps? no 3. Do you have a family history of Colon Cancer or Polyps? no 4. Diabetes Mellitus? no 5. Joint replacements in the past 12 months?no 6. Major health problems in the past 3 months?no 7. Any artificial heart valves, MVP, or defibrillator?no    MEDICATIONS & ALLERGIES:    Patient reports the following regarding taking any anticoagulation/antiplatelet therapy:   Plavix, Coumadin, Eliquis, Xarelto, Lovenox, Pradaxa, Brilinta, or Effient? no Aspirin? no  Patient confirms/reports the following medications:  Current Outpatient Medications  Medication Sig Dispense Refill   amLODipine (NORVASC) 5 MG tablet Take 1 tablet (5 mg total) by mouth daily. 90 tablet 2   Cholecalciferol 125 MCG (5000 UT) TABS cholecalciferol (vitamin D3) 125 mcg (5,000 unit) tablet     fluticasone (FLONASE) 50 MCG/ACT nasal spray Place 2 sprays into both nostrils daily. 16 g 1   levocetirizine (XYZAL) 5 MG tablet Take 5 mg by mouth every evening.     montelukast (SINGULAIR) 10 MG tablet Take 1 tablet (10 mg total) by mouth at bedtime. 30 tablet 3   Norethindrone Acetate-Ethinyl Estradiol (LARIN 1.5/30) 1.5-30 MG-MCG tablet TAKE 1 TABLET BY MOUTH DAILY. 21 tablet 11   omeprazole (PRILOSEC) 20 MG capsule Take 1 capsule (20 mg total) by mouth daily. 30 capsule 5   pravastatin (PRAVACHOL) 40 MG tablet Take 1 tablet (40 mg total) by mouth daily. 90 tablet 3   zinc gluconate 50 MG tablet Take 50 mg by mouth daily.     No current facility-administered medications for this visit.    Patient confirms/reports the following allergies:  Allergies  Allergen Reactions   Ceftriaxone Hives   Erythromycin Other (See Comments)    GI Upset   Sulfa  Antibiotics Rash    No orders of the defined types were placed in this encounter.   AUTHORIZATION INFORMATION Primary Insurance: 1D#: Group #:  Secondary Insurance: 1D#: Group #:  SCHEDULE INFORMATION: Date: 07/13/2022 Time: Location: [facility]

## 2022-06-11 ENCOUNTER — Other Ambulatory Visit: Payer: Self-pay

## 2022-06-11 MED FILL — Norethindrone Ace & Ethinyl Estradiol Tab 1.5 MG-30 MCG: ORAL | 84 days supply | Qty: 84 | Fill #1 | Status: AC

## 2022-06-12 DIAGNOSIS — L700 Acne vulgaris: Secondary | ICD-10-CM | POA: Diagnosis not present

## 2022-06-12 DIAGNOSIS — Z86018 Personal history of other benign neoplasm: Secondary | ICD-10-CM | POA: Diagnosis not present

## 2022-06-12 DIAGNOSIS — L578 Other skin changes due to chronic exposure to nonionizing radiation: Secondary | ICD-10-CM | POA: Diagnosis not present

## 2022-06-12 DIAGNOSIS — L718 Other rosacea: Secondary | ICD-10-CM | POA: Diagnosis not present

## 2022-07-04 NOTE — Anesthesia Preprocedure Evaluation (Addendum)
Anesthesia Evaluation  Patient identified by MRN, date of birth, ID band Patient awake    Reviewed: Allergy & Precautions, H&P , NPO status , Patient's Chart, lab work & pertinent test results  Airway Mallampati: II  TM Distance: <3 FB Neck ROM: Full    Dental no notable dental hx.    Pulmonary neg pulmonary ROS   Pulmonary exam normal breath sounds clear to auscultation       Cardiovascular hypertension, negative cardio ROS Normal cardiovascular exam Rhythm:Regular Rate:Normal     Neuro/Psych  Headaches negative neurological ROS  negative psych ROS   GI/Hepatic negative GI ROS, Neg liver ROS,,,  Endo/Other  negative endocrine ROS    Renal/GU Renal diseasenegative Renal ROS  negative genitourinary   Musculoskeletal negative musculoskeletal ROS (+)    Abdominal   Peds negative pediatric ROS (+)  Hematology negative hematology ROS (+)   Anesthesia Other Findings Vaginal Pap smear, abnormal Migraine HLD (hyperlipidemia) HTN (hypertension)  Ureteral stoneHistory of hypertension    Reproductive/Obstetrics negative OB ROS                             Anesthesia Physical Anesthesia Plan  ASA: 2  Anesthesia Plan: General   Post-op Pain Management:    Induction: Intravenous  PONV Risk Score and Plan:   Airway Management Planned: Natural Airway and Nasal Cannula  Additional Equipment:   Intra-op Plan:   Post-operative Plan:   Informed Consent: I have reviewed the patients History and Physical, chart, labs and discussed the procedure including the risks, benefits and alternatives for the proposed anesthesia with the patient or authorized representative who has indicated his/her understanding and acceptance.     Dental Advisory Given  Plan Discussed with: Anesthesiologist, CRNA and Surgeon  Anesthesia Plan Comments: (Patient consented for risks of anesthesia including but not  limited to:  - adverse reactions to medications - risk of airway placement if required - damage to eyes, teeth, lips or other oral mucosa - nerve damage due to positioning  - sore throat or hoarseness - Damage to heart, brain, nerves, lungs, other parts of body or loss of life  Patient voiced understanding.)       Anesthesia Quick Evaluation

## 2022-07-13 ENCOUNTER — Ambulatory Visit
Admission: RE | Admit: 2022-07-13 | Discharge: 2022-07-13 | Disposition: A | Discharge status: Home / Self Care | Payer: 59 | Source: Ambulatory Visit | Attending: Gastroenterology | Admitting: Gastroenterology

## 2022-07-13 ENCOUNTER — Encounter: Payer: Self-pay | Admitting: Gastroenterology

## 2022-07-13 ENCOUNTER — Encounter
Admission: RE | Disposition: A | Discharge status: Home / Self Care | Payer: Self-pay | Source: Ambulatory Visit | Attending: Gastroenterology

## 2022-07-13 ENCOUNTER — Ambulatory Visit: Payer: 59 | Admitting: Anesthesiology

## 2022-07-13 ENCOUNTER — Other Ambulatory Visit: Payer: Self-pay

## 2022-07-13 DIAGNOSIS — I10 Essential (primary) hypertension: Secondary | ICD-10-CM | POA: Insufficient documentation

## 2022-07-13 DIAGNOSIS — Z1211 Encounter for screening for malignant neoplasm of colon: Secondary | ICD-10-CM

## 2022-07-13 DIAGNOSIS — K640 First degree hemorrhoids: Secondary | ICD-10-CM | POA: Insufficient documentation

## 2022-07-13 HISTORY — PX: COLONOSCOPY WITH PROPOFOL: SHX5780

## 2022-07-13 HISTORY — DX: Encounter for screening for malignant neoplasm of colon: Z12.11

## 2022-07-13 LAB — POCT PREGNANCY, URINE: Preg Test, Ur: NEGATIVE

## 2022-07-13 SURGERY — COLONOSCOPY WITH PROPOFOL
Anesthesia: General | Site: Rectum

## 2022-07-13 MED ORDER — PROPOFOL 10 MG/ML IV BOLUS
INTRAVENOUS | Status: DC | PRN
  Administered 2022-07-13 (×3): 100 mg via INTRAVENOUS
  Administered 2022-07-13 (×2): 50 mg via INTRAVENOUS

## 2022-07-13 MED ORDER — SODIUM CHLORIDE 0.9 % IV SOLN: INTRAVENOUS | Status: DC

## 2022-07-13 MED ORDER — LACTATED RINGERS IV SOLN: INTRAVENOUS | Status: DC

## 2022-07-13 MED ORDER — STERILE WATER FOR IRRIGATION IR SOLN
Status: DC | PRN
  Administered 2022-07-13: 1000 mL

## 2022-07-13 SURGICAL SUPPLY — 21 items
CLIP HMST 235XBRD CATH ROT (MISCELLANEOUS) IMPLANT
CLIP RESOLUTION 360 11X235 (MISCELLANEOUS)
ELECT REM PT RETURN 9FT ADLT (ELECTROSURGICAL)
ELECTRODE REM PT RTRN 9FT ADLT (ELECTROSURGICAL) IMPLANT
FORCEPS BIOP RAD 4 LRG CAP 4 (CUTTING FORCEPS) IMPLANT
GOWN CVR UNV OPN BCK APRN NK (MISCELLANEOUS) ×4 IMPLANT
GOWN ISOL THUMB LOOP REG UNIV (MISCELLANEOUS) ×2
INJECTOR VARIJECT VIN23 (MISCELLANEOUS) IMPLANT
KIT DEFENDO VALVE AND CONN (KITS) IMPLANT
KIT PRC NS LF DISP ENDO (KITS) ×2 IMPLANT
KIT PROCEDURE OLYMPUS (KITS) ×1
MANIFOLD NEPTUNE II (INSTRUMENTS) ×2 IMPLANT
MARKER SPOT ENDO TATTOO 5ML (MISCELLANEOUS) IMPLANT
PROBE APC STR FIRE (PROBE) IMPLANT
RETRIEVER NET ROTH 2.5X230 LF (MISCELLANEOUS) IMPLANT
SNARE COLD EXACTO (MISCELLANEOUS) IMPLANT
SNARE SHORT THROW 13M SML OVAL (MISCELLANEOUS) IMPLANT
SNARE SNG USE RND 15MM (INSTRUMENTS) IMPLANT
TRAP ETRAP POLY (MISCELLANEOUS) IMPLANT
VARIJECT INJECTOR VIN23 (MISCELLANEOUS)
WATER STERILE IRR 250ML POUR (IV SOLUTION) ×2 IMPLANT

## 2022-07-13 NOTE — Op Note (Signed)
[facility] Gastroenterology Patient Name: Anna Kim Procedure Date: 07/13/2022 12:36 PM MRN: [id number] Account #: [id number] Date of Birth: 18-Jan-1976 Admit Type: Outpatient Age: 47 Room: [facility] OR ROOM 01 Gender: Female Note Status: Finalized Instrument Name: [id number] Procedure:             Colonoscopy Indications:           Screening for colorectal malignant neoplasm Providers:             Midge Minium MD, MD Referring MD:          Yehuda Mao. Birdie Sons (Referring MD) Medicines:             Propofol per Anesthesia Complications:         No immediate complications. Procedure:             Pre-Anesthesia Assessment:                        - Prior to the procedure, a History and Physical was                         performed, and patient medications and allergies were                         reviewed. The patient's tolerance of previous                         anesthesia was also reviewed. The risks and benefits                         of the procedure and the sedation options and risks                         were discussed with the patient. All questions were                         answered, and informed consent was obtained. Prior                         Anticoagulants: The patient has taken no anticoagulant                         or antiplatelet agents. ASA Grade Assessment: II - A                         patient with mild systemic disease. After reviewing                         the risks and benefits, the patient was deemed in                         satisfactory condition to undergo the procedure.                        After obtaining informed consent, the colonoscope was                         passed under direct vision. Throughout the procedure,  the patient's blood pressure, pulse, and oxygen                         saturations were monitored continuously. The                         Colonoscope was introduced through the anus  and                         advanced to the the cecum, identified by appendiceal                         orifice and ileocecal valve. The colonoscopy was                         performed without difficulty. The patient tolerated                         the procedure well. The quality of the bowel                         preparation was excellent. Findings:      The perianal and digital rectal examinations were normal.      Non-bleeding internal hemorrhoids were found during retroflexion. The       hemorrhoids were Grade I (internal hemorrhoids that do not prolapse). Impression:            - Non-bleeding internal hemorrhoids.                        - No specimens collected. Recommendation:        - Discharge patient to home.                        - Resume previous diet.                        - Repeat colonoscopy in 10 years for screening                         purposes. Procedure Code(s):     --- Professional ---                        [phone], Colonoscopy, flexible; diagnostic, including                         collection of specimen(s) by brushing or washing, when                         performed (separate procedure) Diagnosis Code(s):     --- Professional ---                        Z12.11, Encounter for screening for malignant neoplasm                         of colon CPT copyright 2022 American [employer] Association. All rights reserved. The codes documented in this report are preliminary and upon coder review may  be revised to meet current compliance requirements. Midge Minium MD, MD 07/13/2022 1:02:48 PM  This report has been signed electronically. Number of Addenda: 0 Note Initiated On: 07/13/2022 12:36 PM Scope Withdrawal Time: 0 hours 7 minutes 58 seconds  Total Procedure Duration: 0 hours 12 minutes 8 seconds  Estimated Blood Loss:  Estimated blood loss: none.      [facility]

## 2022-07-13 NOTE — Transfer of Care (Signed)
Immediate Anesthesia Transfer of Care Note  Patient: Anna Kim  Procedure(s) Performed: COLONOSCOPY WITH PROPOFOL (Rectum)  Patient Location: PACU  Anesthesia Type: General  Level of Consciousness: awake, alert  and patient cooperative  Airway and Oxygen Therapy: Patient Spontanous Breathing and Patient connected to supplemental oxygen  Post-op Assessment: Post-op Vital signs reviewed, Patient's Cardiovascular Status Stable, Respiratory Function Stable, Patent Airway and No signs of Nausea or vomiting  Post-op Vital Signs: Reviewed and stable  Complications: No notable events documented.

## 2022-07-13 NOTE — Anesthesia Postprocedure Evaluation (Signed)
Anesthesia Post Note  Patient: Anna Kim  Procedure(s) Performed: COLONOSCOPY WITH PROPOFOL (Rectum)  Patient location during evaluation: PACU Anesthesia Type: General Level of consciousness: awake and alert Pain management: pain level controlled Vital Signs Assessment: post-procedure vital signs reviewed and stable Respiratory status: spontaneous breathing, nonlabored ventilation, respiratory function stable and patient connected to nasal cannula oxygen Cardiovascular status: blood pressure returned to baseline and stable Postop Assessment: no apparent nausea or vomiting Anesthetic complications: no   No notable events documented.   Last Vitals:  Vitals:   07/13/22 1310 07/13/22 1313  BP:  128/80  Pulse: 78 77  Resp: 20 20  Temp:  (!) 36.3 C  SpO2: 99% 99%    Last Pain:  Vitals:   07/13/22 1313  TempSrc:   PainSc: 0-No pain                 Ricca Melgarejo C Arvid Marengo

## 2022-07-13 NOTE — H&P (Signed)
Midge Minium, MD [facility] [address], [address] [place], [place] [address] Phone: [phone] Fax : [phone]  Primary Care Physician:  Glori Luis, MD Primary Gastroenterologist:  Dr. Servando Snare  Pre-Procedure History & Physical: HPI:  Anna Kim is a 47 y.o. female is here for a screening colonoscopy.   Past Medical History:  Diagnosis Date   History of hypertension 01/20/2016   HLD (hyperlipidemia)    HTN (hypertension)    Migraine    Ureteral stone    Vaginal Pap smear, abnormal    Remote history of 1 abnormal pap    Past Surgical History:  Procedure Laterality Date   BLADDER SUSPENSION N/A 04/05/2022   Procedure: TRANSVAGINAL TAPE (TVT) PROCEDURE;  Surgeon: Marguerita Beards, MD;  Location: [facility];  Service: Gynecology;  Laterality: N/A;  total time requested is 1 hour   CYSTOSCOPY N/A 04/05/2022   Procedure: CYSTOSCOPY;  Surgeon: Marguerita Beards, MD;  Location: [facility];  Service: Gynecology;  Laterality: N/A;   TONSILLECTOMY  2011    Prior to Admission medications   Medication Sig Start Date End Date Taking? Authorizing Provider  amLODipine (NORVASC) 5 MG tablet Take 1 tablet (5 mg total) by mouth daily. 01/29/22  Yes Glori Luis, MD  Cholecalciferol 125 MCG (5000 UT) TABS cholecalciferol (vitamin D3) 125 mcg (5,000 unit) tablet   Yes [provider]  fluticasone (FLONASE) 50 MCG/ACT nasal spray Place 2 sprays into both nostrils daily. 07/20/19  Yes Glori Luis, MD  levocetirizine (XYZAL) 5 MG tablet Take 5 mg by mouth every evening.   Yes [provider]  montelukast (SINGULAIR) 10 MG tablet Take 1 tablet (10 mg total) by mouth at bedtime. 04/16/22  Yes Glori Luis, MD  Norethindrone Acetate-Ethinyl Estradiol (LARIN 1.5/30) 1.5-30 MG-MCG tablet TAKE 1 TABLET BY MOUTH DAILY. 03/12/22  Yes Doreene Burke, CNM  omeprazole (PRILOSEC) 20 MG capsule Take 1 capsule (20 mg total) by  mouth daily. 04/09/22  Yes Glori Luis, MD  pravastatin (PRAVACHOL) 40 MG tablet Take 1 tablet (40 mg total) by mouth daily. 04/16/22 04/16/23 Yes Glori Luis, MD  zinc gluconate 50 MG tablet Take 50 mg by mouth daily. Patient not taking: Reported on 07/04/2022    [provider]  Calcium Carb-Cholecalciferol (CALCIUM + D3 PO) Take 1 tablet by mouth daily.  03/06/19  [provider]    Allergies as of 05/29/2022 - Review Complete 05/23/2022  Allergen Reaction Noted   Ceftriaxone Hives 12/30/2016   Erythromycin Other (See Comments) 02/09/2015   Sulfa antibiotics Rash 03/22/2015    Family History  Problem Relation Age of Onset   Cancer Maternal Aunt        ovarian   Hypertension Mother    Hypertension Father    Atrial fibrillation Father    Kidney cancer Neg Hx    Bladder Cancer Neg Hx    Breast cancer Neg Hx     Social History   Socioeconomic History   Marital status: Divorced    Spouse name: Not on file   Number of children: Not on file   Years of education: Not on file   Highest education level: Not on file  Occupational History   Not on file  Tobacco Use   Smoking status: Never   Smokeless tobacco: Never  Vaping Use   Vaping Use: Never used  Substance and Sexual Activity   Alcohol use: Yes    Comment: occas  Drug use: No   Sexual activity: Not Currently    Birth control/protection: Pill  Other Topics Concern   Not on file  Social History Narrative   Not on file   Social Determinants of Health   Financial Resource Strain: Not on file  Food Insecurity: Not on file  Transportation Needs: Not on file  Physical Activity: Not on file  Stress: Not on file  Social Connections: Not on file  Intimate Partner Violence: Not on file    Review of Systems: See HPI, otherwise negative ROS  Physical Exam: BP 131/71   Temp (!) 97.4 F (36.3 C) (Temporal)   Resp 15   Ht  (1.676 m)   Wt 79.8 kg   SpO2 98%   BMI 28.41 kg/m   General:   Alert,  pleasant and cooperative in NAD Head:  Normocephalic and atraumatic. Neck:  Supple; no masses or thyromegaly. Lungs:  Clear throughout to auscultation.    Heart:  Regular rate and rhythm. Abdomen:  Soft, nontender and nondistended. Normal bowel sounds, without guarding, and without rebound.   Neurologic:  Alert and  oriented x4;  grossly normal neurologically.  Impression/Plan: Anna Kim is now here to undergo a screening colonoscopy.  Risks, benefits, and alternatives regarding colonoscopy have been reviewed with the patient.  Questions have been answered.  All parties agreeable.

## 2022-07-16 ENCOUNTER — Encounter: Payer: Self-pay | Admitting: Gastroenterology

## 2022-08-13 ENCOUNTER — Encounter: Payer: Self-pay | Admitting: Family Medicine

## 2022-08-21 ENCOUNTER — Encounter: Payer: Self-pay | Admitting: Family Medicine

## 2022-08-21 ENCOUNTER — Ambulatory Visit (INDEPENDENT_AMBULATORY_CARE_PROVIDER_SITE_OTHER): Payer: 59 | Admitting: Family Medicine

## 2022-08-21 ENCOUNTER — Other Ambulatory Visit: Payer: Self-pay

## 2022-08-21 VITALS — BP 114/80 | HR 78 | Temp 98.0°F | Ht 66.0 in | Wt 179.2 lb

## 2022-08-21 DIAGNOSIS — Z0001 Encounter for general adult medical examination with abnormal findings: Secondary | ICD-10-CM

## 2022-08-21 DIAGNOSIS — E785 Hyperlipidemia, unspecified: Secondary | ICD-10-CM

## 2022-08-21 DIAGNOSIS — J309 Allergic rhinitis, unspecified: Secondary | ICD-10-CM | POA: Diagnosis not present

## 2022-08-21 DIAGNOSIS — E663 Overweight: Secondary | ICD-10-CM | POA: Diagnosis not present

## 2022-08-21 DIAGNOSIS — Z1329 Encounter for screening for other suspected endocrine disorder: Secondary | ICD-10-CM

## 2022-08-21 DIAGNOSIS — I10 Essential (primary) hypertension: Secondary | ICD-10-CM

## 2022-08-21 DIAGNOSIS — Z130 Encounter for screening for diseases of the blood and blood-forming organs and certain disorders involving the immune mechanism: Secondary | ICD-10-CM | POA: Diagnosis not present

## 2022-08-21 DIAGNOSIS — M79645 Pain in left finger(s): Secondary | ICD-10-CM | POA: Insufficient documentation

## 2022-08-21 HISTORY — DX: Encounter for general adult medical examination with abnormal findings: Z00.01

## 2022-08-21 LAB — COMPREHENSIVE METABOLIC PANEL
ALT: 11 U/L (ref 0–35)
AST: 17 U/L (ref 0–37)
Albumin: 4.1 g/dL (ref 3.5–5.2)
Alkaline Phosphatase: 75 U/L (ref 39–117)
BUN: 14 mg/dL (ref 6–23)
CO2: 28 mEq/L (ref 19–32)
Calcium: 9.2 mg/dL (ref 8.4–10.5)
Chloride: 102 mEq/L (ref 96–112)
Creatinine, Ser: 0.8 mg/dL (ref 0.40–1.20)
GFR: 87.06 mL/min (ref >60.00)
Glucose, Bld: 89 mg/dL (ref 70–99)
Potassium: 3.6 mEq/L (ref 3.5–5.1)
Sodium: 138 mEq/L (ref 135–145)
Total Bilirubin: 0.5 mg/dL (ref 0.2–1.2)
Total Protein: 7.1 g/dL (ref 6.0–8.3)

## 2022-08-21 LAB — LIPID PANEL
Cholesterol: 162 mg/dL (ref 0–200)
HDL: 51.7 mg/dL (ref >39.00)
LDL Cholesterol: 90 mg/dL (ref 0–99)
NonHDL: 110.2
Total CHOL/HDL Ratio: 3
Triglycerides: 100 mg/dL (ref 0.0–149.0)
VLDL: 20 mg/dL (ref 0.0–40.0)

## 2022-08-21 LAB — CBC
HCT: 40.5 % (ref 36.0–46.0)
Hemoglobin: 13.3 g/dL (ref 12.0–15.0)
MCHC: 32.9 g/dL (ref 30.0–36.0)
MCV: 95.7 fl (ref 78.0–100.0)
Platelets: 235 10*3/uL (ref 150.0–400.0)
RBC: 4.23 Mil/uL (ref 3.87–5.11)
RDW: 13.2 % (ref 11.5–15.5)
WBC: 8.2 10*3/uL (ref 4.0–10.5)

## 2022-08-21 LAB — HEMOGLOBIN A1C: Hgb A1c MFr Bld: 5.8 % (ref 4.6–6.5)

## 2022-08-21 LAB — TSH: TSH: 1.63 u[IU]/mL (ref 0.35–5.50)

## 2022-08-21 MED ORDER — AMLODIPINE BESYLATE 5 MG PO TABS
5.0000 mg | ORAL_TABLET | Freq: Every day | ORAL | 2 refills | Status: DC
Start: 2022-08-21 — End: 2023-04-29
  Filled 2022-08-21 – 2022-10-23 (×2): qty 90, 90d supply, fill #0
  Filled 2023-01-18: qty 90, 90d supply, fill #1
  Filled 2023-04-18: qty 90, 90d supply, fill #2

## 2022-08-21 MED ORDER — MONTELUKAST SODIUM 10 MG PO TABS
10.0000 mg | ORAL_TABLET | Freq: Every day | ORAL | 3 refills | Status: DC
Start: 2022-08-21 — End: 2023-03-04
  Filled 2022-08-21 – 2022-09-09 (×2): qty 30, 30d supply, fill #0
  Filled 2022-10-10: qty 30, 30d supply, fill #1
  Filled 2022-12-03: qty 30, 30d supply, fill #2
  Filled 2023-01-13: qty 30, 30d supply, fill #3

## 2022-08-21 NOTE — Assessment & Plan Note (Signed)
Patient with pain in the PIP joint of the left fifth finger.  This has been improving some.  Discussed the potential that this could be related to her softball glove fitting poorly.  I encouraged her to try different softball glove.  Discussed this also could be related to osteoarthritis.  Discussed she could use over-the-counter Aleve or ibuprofen to help with any inflammation.  If she has worsening symptoms or develops new symptoms or pain in other fingers she will let us know.

## 2022-08-21 NOTE — Progress Notes (Signed)
Marikay Alar, MD Phone: [phone]  Anna Kim is a 47 y.o. female who presents today for CPE.  Diet: trying to eat healthier, cooking at home more, eats fruits and vegetables, 1 soda per day, minimizes sweets and junk Exercise: not as much as she needs, does a step exercise machine most days, softball on saturday Pap smear: UTD, 04/25/20 NILM Neg HPV Colonoscopy: 07/13/22 10 year recall Mammogram: UTD, scheduled for July Family history-  Colon cancer: no  Breast cancer: no  Ovarian cancer: aunt, though reports her aunt had genetic testing and it was negative Menses: irregular on OCP, notes GYN advised her this was normal on this medication Vaccines-   Flu: UTD  Tetanus: UTD  COVID19: x2 HIV screening: UTD Hep C Screening: UTD Tobacco use: no Alcohol use: rare Illicit Drug use: no Dentist: yes Ophthalmology: yes  Left fifth finger PIP joint with pain: This has been going on for the past couple of months.  She notes no injury.  She notes wearing her softball glove bothers it more.  She not tried any medication for this.  Notes it stiff.  Notes it has improved some.   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   Left foot pain 03/04/2018   Skin exam, screening for cancer 03/04/2018   Allergic rhinitis 07/30/2018   Hypertension 03/11/2019   Vitamin D deficiency 03/11/2019   Overweight 07/20/2019   Neck pain 12/15/2019   Need for hepatitis C screening test 06/04/2020   Joint pain 08/01/2020   Heartburn 02/01/2021   Right ankle sprain 02/01/2021   Complete tear of ulnar collateral ligament of metacarpophalangeal (MCP) joint of finger 02/09/2022   Colon cancer screening 07/13/2022   Encounter for general adult medical examination with abnormal findings 08/21/2022   Finger pain, left 08/21/2022   Resolved Ambulatory Problems    Diagnosis Date Noted   History of  hypertension 01/20/2016   Encounter for health maintenance examination with abnormal findings 11/16/2016   Hypokalemia 01/25/2017   Abnormal urinalysis 12/15/2018   Past Medical History:  Diagnosis Date   HLD (hyperlipidemia)    HTN (hypertension)    Migraine    Ureteral stone    Vaginal Pap smear, abnormal     Family History  Problem Relation Age of Onset   Cancer Maternal Aunt        ovarian   Hypertension Mother    Hypertension Father    Atrial fibrillation Father    Kidney cancer Neg Hx    Bladder Cancer Neg Hx    Breast cancer Neg Hx     Social History   Socioeconomic History   Marital status: Divorced    Spouse name: Not on file   Number of children: Not on file   Years of education: Not on file   Highest education level: Not on file  Occupational History   Not on file  Tobacco Use   Smoking status: Never   Smokeless tobacco: Never  Vaping Use   Vaping Use: Never used  Substance and Sexual Activity   Alcohol use: Yes    Comment: occas   Drug use: No   Sexual activity: Not Currently    Birth control/protection: Pill  Other Topics Concern   Not on file  Social History Narrative   Not on file   Social Determinants of Health   Financial Resource Strain: Not on file  Food Insecurity:  Not on file  Transportation Needs: Not on file  Physical Activity: Not on file  Stress: Not on file  Social Connections: Not on file  Intimate Partner Violence: Not on file    ROS  General:  Negative for nexplained weight loss, fever Skin: Negative for new or changing mole, sore that won't heal HEENT: Negative for trouble hearing, trouble seeing, ringing in ears, mouth sores, hoarseness, change in voice, dysphagia. CV:  Negative for chest pain, dyspnea, edema, palpitations Resp: Negative for cough, dyspnea, hemoptysis GI: Negative for nausea, vomiting, diarrhea, constipation, abdominal pain, melena, hematochezia. GU: Negative for dysuria, incontinence, urinary  hesitance, hematuria, vaginal or penile discharge, polyuria, sexual difficulty, lumps in testicle or breasts MSK: Negative for muscle cramps or aches, joint pain or swelling Neuro: Negative for headaches, weakness, numbness, dizziness, passing out/fainting Psych: Negative for depression, anxiety, memory problems  Objective  Physical Exam Vitals:   08/21/22 0828  BP: 114/80  Pulse: 78  Temp: 98 F (36.7 C)  SpO2: 97%    BP Readings from Last 3 Encounters:  08/21/22 114/80  07/13/22 128/80  05/23/22 120/79   Wt Readings from Last 3 Encounters:  08/21/22 179 lb 3.2 oz (81.3 kg)  07/13/22 176 lb (79.8 kg)  05/23/22 177 lb 6.4 oz (80.5 kg)    Physical Exam Constitutional:      General: She is not in acute distress.    Appearance: She is not diaphoretic.  HENT:     Head: Normocephalic and atraumatic.  Cardiovascular:     Rate and Rhythm: Normal rate and regular rhythm.     Heart sounds: Normal heart sounds.  Pulmonary:     Effort: Pulmonary effort is normal.     Breath sounds: Normal breath sounds.  Abdominal:     General: Bowel sounds are normal. There is no distension.     Palpations: Abdomen is soft.     Tenderness: There is no abdominal tenderness.  Musculoskeletal:     Right lower leg: No edema.     Left lower leg: No edema.     Comments: Left fifth PIP joint with mild tenderness, no significant swelling, no erythema, no warmth, no other tenderness of the joints in her fingers of the left hand  Lymphadenopathy:     Cervical: No cervical adenopathy.  Skin:    General: Skin is warm and dry.  Neurological:     Mental Status: She is alert.  Psychiatric:        Mood and Affect: Mood normal.      Assessment/Plan:   Encounter for general adult medical examination with abnormal findings Assessment & Plan: Physical exam completed.  Encouraged healthy diet and exercise.  Discussed a goal of 150 of exercise weekly.  Cancer screening is up-to-date.  Patient declines  further COVID vaccinations.  She will monitor her menstrual cycles and if they change at all she will let her gynecologist know.  Lab work as outlined.   Primary hypertension -     amLODIPine Besylate; Take 1 tablet (5 mg total) by mouth daily.  Dispense: 90 tablet; Refill: 2  Allergic rhinitis, unspecified seasonality, unspecified trigger -     Montelukast Sodium; Take 1 tablet (10 mg total) by mouth at bedtime.  Dispense: 30 tablet; Refill: 3  Finger pain, left Assessment & Plan: Patient with pain in the PIP joint of the left fifth finger.  This has been improving some.  Discussed the potential that this could be related to her softball glove fitting  poorly.  I encouraged her to try different softball glove.  Discussed this also could be related to osteoarthritis.  Discussed she could use over-the-counter Aleve or ibuprofen to help with any inflammation.  If she has worsening symptoms or develops new symptoms or pain in other fingers she will let us know.   Overweight -     Hemoglobin A1c  Hyperlipidemia, unspecified hyperlipidemia type -     Comprehensive metabolic panel -     Lipid panel  Screening for deficiency anemia -     CBC  Thyroid disorder screen -     TSH    Return in about 6 months (around 02/21/2023) for HTN.   Marikay Alar, MD [facility]

## 2022-08-21 NOTE — Assessment & Plan Note (Signed)
Physical exam completed.  Encouraged healthy diet and exercise.  Discussed a goal of 150 of exercise weekly.  Cancer screening is up-to-date.  Patient declines further COVID vaccinations.  She will monitor her menstrual cycles and if they change at all she will let her gynecologist know.  Lab work as outlined.

## 2022-09-05 ENCOUNTER — Encounter: Payer: Self-pay | Admitting: Obstetrics and Gynecology

## 2022-09-09 ENCOUNTER — Other Ambulatory Visit: Payer: Self-pay

## 2022-09-09 MED FILL — Norethindrone Ace & Ethinyl Estradiol Tab 1.5 MG-30 MCG: ORAL | 84 days supply | Qty: 84 | Fill #2 | Status: AC

## 2022-10-01 ENCOUNTER — Ambulatory Visit
Admission: RE | Admit: 2022-10-01 | Discharge: 2022-10-01 | Disposition: A | Discharge status: Home / Self Care | Payer: 59 | Source: Ambulatory Visit | Attending: Certified Nurse Midwife | Admitting: Certified Nurse Midwife

## 2022-10-01 DIAGNOSIS — Z1231 Encounter for screening mammogram for malignant neoplasm of breast: Secondary | ICD-10-CM | POA: Insufficient documentation

## 2022-10-01 DIAGNOSIS — Z01419 Encounter for gynecological examination (general) (routine) without abnormal findings: Secondary | ICD-10-CM

## 2022-10-03 ENCOUNTER — Other Ambulatory Visit: Payer: Self-pay | Admitting: Oncology

## 2022-10-03 DIAGNOSIS — Z006 Encounter for examination for normal comparison and control in clinical research program: Secondary | ICD-10-CM

## 2022-10-08 ENCOUNTER — Other Ambulatory Visit: Payer: Self-pay | Admitting: Family Medicine

## 2022-10-08 DIAGNOSIS — R12 Heartburn: Secondary | ICD-10-CM

## 2022-10-09 ENCOUNTER — Other Ambulatory Visit: Payer: Self-pay

## 2022-10-09 ENCOUNTER — Other Ambulatory Visit: Payer: Self-pay | Admitting: Family Medicine

## 2022-10-09 DIAGNOSIS — R12 Heartburn: Secondary | ICD-10-CM

## 2022-10-09 MED FILL — Omeprazole Cap Delayed Release 20 MG: ORAL | 90 days supply | Qty: 90 | Fill #0 | Status: AC

## 2022-10-10 ENCOUNTER — Other Ambulatory Visit: Payer: Self-pay

## 2022-10-23 ENCOUNTER — Other Ambulatory Visit: Payer: Self-pay

## 2022-11-01 ENCOUNTER — Ambulatory Visit
Admission: RE | Admit: 2022-11-01 | Discharge: 2022-11-01 | Disposition: A | Discharge status: Home / Self Care | Payer: 59 | Source: Ambulatory Visit | Attending: Emergency Medicine | Admitting: Emergency Medicine

## 2022-11-01 ENCOUNTER — Other Ambulatory Visit: Payer: Self-pay

## 2022-11-01 VITALS — BP 148/87 | HR 81 | Temp 98.8°F | Resp 16

## 2022-11-01 DIAGNOSIS — J069 Acute upper respiratory infection, unspecified: Secondary | ICD-10-CM | POA: Diagnosis not present

## 2022-11-01 LAB — POCT RAPID STREP A (OFFICE): Rapid Strep A Screen: NEGATIVE

## 2022-11-01 MED ORDER — LIDOCAINE VISCOUS HCL 2 % MT SOLN
5.0000 mL | Freq: Four times a day (QID) | OROMUCOSAL | 0 refills | Status: DC | PRN
  Filled 2022-11-01: qty 300, 14d supply, fill #0

## 2022-11-01 MED ORDER — IPRATROPIUM BROMIDE 0.03 % NA SOLN
2.0000 | Freq: Two times a day (BID) | NASAL | 12 refills | Status: DC
  Filled 2022-11-01: qty 30, 30d supply, fill #0
  Filled 2022-12-03: qty 30, 30d supply, fill #1

## 2022-11-01 NOTE — ED Provider Notes (Signed)
[facility]    CSN: [id number] Arrival date & time: 11/01/22  1145      History   Chief Complaint Chief Complaint  Patient presents with   Sore Throat    I've had a scratchy throat and nasal congestion since Monday; Tuesday I had a fever on and off; no fever on Wednesday or today; I just have a bad sore throat and coughing ; negative for Covid - Entered by patient    HPI Anna Kim is a 47 y.o. female.   Patient presents for evaluation of scratchy throat and nasal congestion 4 days ago, has resolved.  Subjective fevers for 2 days, has resolved, now experiencing a sore throat, postnasal drip and a nonproductive cough 1 day ago, worsening over, interfering with sleep.  Known sick contacts with exposure to COVID last week.  Has completed 3 home tests, all negative.  Has been using over-the-counter DayQuil NyQuil which has been somewhat helpful.  Takes a daily antihistamine for seasonal allergies.  Tolerating food and liquids.  Denies shortness of breath, wheezing or abdominal symptoms.  Past Medical History:  Diagnosis Date   History of hypertension 01/20/2016   HLD (hyperlipidemia)    HTN (hypertension)    Migraine    Ureteral stone    Vaginal Pap smear, abnormal    Remote history of 1 abnormal pap    Patient Active Problem List   Diagnosis Date Noted   Encounter for general adult medical examination with abnormal findings 08/21/2022   Finger pain, left 08/21/2022   Colon cancer screening 07/13/2022   Complete tear of ulnar collateral ligament of metacarpophalangeal (MCP) joint of finger 02/09/2022   Heartburn 02/01/2021   Right ankle sprain 02/01/2021   Joint pain 08/01/2020   Need for hepatitis C screening test 06/04/2020   Neck pain 12/15/2019   Overweight 07/20/2019   Hypertension 03/11/2019   Vitamin D deficiency 03/11/2019   Allergic rhinitis 07/30/2018   Left foot pain 03/04/2018   Skin exam, screening for cancer 03/04/2018   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   Prediabetes 01/20/2016   Hyperlipidemia 01/20/2016    Past Surgical History:  Procedure Laterality Date   BLADDER SUSPENSION N/A 04/05/2022   Procedure: TRANSVAGINAL TAPE (TVT) PROCEDURE;  Surgeon: Marguerita Beards, MD;  Location: [facility];  Service: Gynecology;  Laterality: N/A;  total time requested is 1 hour   COLONOSCOPY WITH PROPOFOL N/A 07/13/2022   Procedure: COLONOSCOPY WITH PROPOFOL;  Surgeon: Midge Minium, MD;  Location: [facility];  Service: Endoscopy;  Laterality: N/A;   CYSTOSCOPY N/A 04/05/2022   Procedure: CYSTOSCOPY;  Surgeon: Marguerita Beards, MD;  Location: [facility];  Service: Gynecology;  Laterality: N/A;   TONSILLECTOMY  2011    OB History     Gravida  1   Para  1   Term  1   Preterm      AB      Living  1      SAB      IAB      Ectopic      Multiple      Live Births  1            Home Medications    Prior to Admission medications   Medication Sig Start Date End Date Taking? Authorizing Provider  ipratropium (ATROVENT) 0.03 % nasal spray Place 2 sprays into both nostrils every 12 (twelve) hours. 11/01/22  Yes Valinda Hoar, NP  magic  mouthwash (lidocaine, diphenhydrAMINE, alum & mag hydroxide) suspension Swish and spit 5 mLs 4 (four) times daily as needed for mouth pain. 11/01/22  Yes Cendy Oconnor R, NP  amLODipine (NORVASC) 5 MG tablet Take 1 tablet (5 mg total) by mouth daily. 08/21/22   Glori Luis, MD  Cholecalciferol 125 MCG (5000 UT) TABS cholecalciferol (vitamin D3) 125 mcg (5,000 unit) tablet    [provider]  fluticasone (FLONASE) 50 MCG/ACT nasal spray Place 2 sprays into both nostrils daily. 07/20/19   Glori Luis, MD  levocetirizine (XYZAL) 5 MG tablet Take 5 mg by mouth every evening.    [provider]  montelukast (SINGULAIR) 10 MG tablet Take 1 tablet (10 mg total) by mouth at bedtime. 08/21/22   Glori Luis, MD   Norethindrone Acetate-Ethinyl Estradiol (LARIN 1.5/30) 1.5-30 MG-MCG tablet TAKE 1 TABLET BY MOUTH DAILY. 03/12/22   Doreene Burke, CNM  omeprazole (PRILOSEC) 20 MG capsule Take 1 capsule (20 mg total) by mouth daily. 10/09/22   Glori Luis, MD  pravastatin (PRAVACHOL) 40 MG tablet Take 1 tablet (40 mg total) by mouth daily. 04/16/22 04/16/23  Glori Luis, MD  Calcium Carb-Cholecalciferol (CALCIUM + D3 PO) Take 1 tablet by mouth daily.  03/06/19  [provider]    Family History Family History  Problem Relation Age of Onset   Cancer Maternal Aunt        ovarian   Hypertension Mother    Hypertension Father    Atrial fibrillation Father    Kidney cancer Neg Hx    Bladder Cancer Neg Hx    Breast cancer Neg Hx     Social History Social History   Tobacco Use   Smoking status: Never   Smokeless tobacco: Never  Vaping Use   Vaping status: Never Used  Substance Use Topics   Alcohol use: Yes    Comment: occas   Drug use: No     Allergies   Ceftriaxone, Erythromycin, and Sulfa antibiotics   Review of Systems Review of Systems   Physical Exam Triage Vital Signs ED Triage Vitals [11/01/22 1226]  Encounter Vitals Group     BP (!) 148/87     Systolic BP Percentile      Diastolic BP Percentile      Pulse Rate 81     Resp 16     Temp 98.8 F (37.1 C)     Temp Source Oral     SpO2 96 %     Weight      Height      Head Circumference      Peak Flow      Pain Score 5     Pain Loc      Pain Education      Exclude from Growth Chart    No data found.  Updated Vital Signs BP (!) 148/87 (BP Location: Left Arm)   Pulse 81   Temp 98.8 F (37.1 C) (Oral)   Resp 16   SpO2 96%   Visual Acuity Right Eye Distance:   Left Eye Distance:   Bilateral Distance:    Right Eye Near:   Left Eye Near:    Bilateral Near:     Physical Exam Constitutional:      Appearance: Normal appearance. She is well-developed.  HENT:     Head: Normocephalic.      Right Ear: Tympanic membrane and ear canal normal.     Left Ear: Tympanic membrane and ear  canal normal.     Nose: Congestion and rhinorrhea present.     Mouth/Throat:     Pharynx: Posterior oropharyngeal erythema present.     Tonsils: No tonsillar exudate.  Eyes:     Extraocular Movements: Extraocular movements intact.  Cardiovascular:     Rate and Rhythm: Normal rate and regular rhythm.     Pulses: Normal pulses.     Heart sounds: Normal heart sounds.  Pulmonary:     Effort: Pulmonary effort is normal.     Breath sounds: Normal breath sounds.  Musculoskeletal:        General: Normal range of motion.     Cervical back: Normal range of motion and neck supple.  Skin:    General: Skin is warm and dry.  Neurological:     Mental Status: She is alert and oriented to person, place, and time. Mental status is at baseline.      UC Treatments / Results  Labs (all labs ordered are listed, but only abnormal results are displayed) Labs Reviewed  POCT RAPID STREP A (OFFICE)    EKG   Radiology No results found.  Procedures Procedures (including critical care time)  Medications Ordered in UC Medications - No data to display  Initial Impression / Assessment and Plan / UC Course  I have reviewed the triage vital signs and the nursing notes.  Pertinent labs & imaging results that were available during my care of the patient were reviewed by me and considered in my medical decision making (see chart for details).  Viral URI  Patient is in no signs of distress nor toxic appearing.  Vital signs are stable.  Low suspicion for pneumonia, pneumothorax or bronchitis and therefore will defer imaging.  3 home COVID test negative, will defer PCR.  Rapid strep test negative, discussed findings.  Prescribed Magic mouthwash and Atrovent nasal spray sore throat and congestion and most worrisome symptoms.May use additional over-the-counter medications as needed for supportive care.  May follow-up  with urgent care as needed if symptoms persist or worsen. .   Final Clinical Impressions(s) / UC Diagnoses   Final diagnoses:  Viral URI     Discharge Instructions      Your symptoms today are most likely being caused by a virus and should steadily improve in time it can take up to 7 to 10 days before you truly start to see a turnaround however things will get better  Rapid strep test is negative for bacteria  You may gargle and spit Magic mouthwash solution every 4-6 hours as needed to give temporary relief to your throat  You may use Atrovent nasal spray twice daily to clear out congestion from the sinuses    You can take Tylenol and/or Ibuprofen as needed for fever reduction and pain relief.   For cough: honey 1/2 to 1 teaspoon (you can dilute the honey in water or another fluid).  You can also use guaifenesin and dextromethorphan for cough. You can use a humidifier for chest congestion and cough.  If you don't have a humidifier, you can sit in the bathroom with the hot shower running.      For sore throat: try warm salt water gargles, cepacol lozenges, throat spray, warm tea or water with lemon/honey, popsicles or ice, or OTC cold relief medicine for throat discomfort.   For congestion: take a daily anti-histamine like Zyrtec, Claritin, and a oral decongestant, such as pseudoephedrine.  You can also use Flonase 1-2 sprays in each nostril  daily.   It is important to stay hydrated: drink plenty of fluids (water, gatorade/powerade/pedialyte, juices, or teas) to keep your throat moisturized and help further relieve irritation/discomfort.    ED Prescriptions     Medication Sig Dispense Auth. Provider   magic mouthwash (lidocaine, diphenhydrAMINE, alum & mag hydroxide) suspension Swish and spit 5 mLs 4 (four) times daily as needed for mouth pain. 360 mL Dean Wonder R, NP   ipratropium (ATROVENT) 0.03 % nasal spray Place 2 sprays into both nostrils every 12 (twelve) hours. 30 mL  Valinda Hoar, NP      PDMP not reviewed this encounter.   Valinda Hoar, NP 11/01/22 1259

## 2022-11-01 NOTE — ED Triage Notes (Signed)
Patient presents to UC for sore throat and cough since weds. Negative for covid. Last known fever Tuesday. Treating symptoms with mucinex.

## 2022-11-01 NOTE — Discharge Instructions (Signed)
Your symptoms today are most likely being caused by a virus and should steadily improve in time it can take up to 7 to 10 days before you truly start to see a turnaround however things will get better  Rapid strep test is negative for bacteria  You may gargle and spit Magic mouthwash solution every 4-6 hours as needed to give temporary relief to your throat  You may use Atrovent nasal spray twice daily to clear out congestion from the sinuses    You can take Tylenol and/or Ibuprofen as needed for fever reduction and pain relief.   For cough: honey 1/2 to 1 teaspoon (you can dilute the honey in water or another fluid).  You can also use guaifenesin and dextromethorphan for cough. You can use a humidifier for chest congestion and cough.  If you don't have a humidifier, you can sit in the bathroom with the hot shower running.      For sore throat: try warm salt water gargles, cepacol lozenges, throat spray, warm tea or water with lemon/honey, popsicles or ice, or OTC cold relief medicine for throat discomfort.   For congestion: take a daily anti-histamine like Zyrtec, Claritin, and a oral decongestant, such as pseudoephedrine.  You can also use Flonase 1-2 sprays in each nostril daily.   It is important to stay hydrated: drink plenty of fluids (water, gatorade/powerade/pedialyte, juices, or teas) to keep your throat moisturized and help further relieve irritation/discomfort.

## 2022-11-02 ENCOUNTER — Ambulatory Visit: Payer: 59 | Admitting: Obstetrics and Gynecology

## 2022-11-16 ENCOUNTER — Encounter: Payer: Self-pay | Admitting: Obstetrics and Gynecology

## 2022-11-16 ENCOUNTER — Other Ambulatory Visit: Payer: Self-pay

## 2022-11-16 ENCOUNTER — Ambulatory Visit: Payer: 59 | Admitting: Obstetrics and Gynecology

## 2022-11-16 VITALS — BP 133/78 | HR 72

## 2022-11-16 DIAGNOSIS — N393 Stress incontinence (female) (male): Secondary | ICD-10-CM

## 2022-11-16 DIAGNOSIS — N3281 Overactive bladder: Secondary | ICD-10-CM

## 2022-11-16 MED ORDER — SOLIFENACIN SUCCINATE 5 MG PO TABS
5.0000 mg | ORAL_TABLET | Freq: Every day | ORAL | 5 refills | Status: DC
Start: 2022-11-16 — End: 2023-02-26
  Filled 2022-11-16: qty 30, 30d supply, fill #0

## 2022-11-16 NOTE — Progress Notes (Signed)
[facility]  Date of Visit: 11/16/2022  History of Present Illness: Anna Kim is a 47 y.o. female scheduled today for a post-operative visit.   Surgery: s/p Midurethral sling (Advantage Fit), cystoscopy on 04/05/22  She has had good improvement overall with the sling. No longer is leaking with exercise. However, she still has to wear a liner and has some wetness throughout the day. Difficult for her to know when she is leaking. She is not going to the bathroom frequently.   Medications: She has a current medication list which includes the following prescription(s): solifenacin, amlodipine, cholecalciferol, fluticasone, ipratropium, levocetirizine, magic mouthwash (lidocaine, diphenhydrAMINE, alum & mag hydroxide) suspension, montelukast, larin 1.5/30, omeprazole, pravastatin, and [DISCONTINUED] calcium carb-cholecalciferol.   Allergies: Patient is allergic to ceftriaxone, erythromycin, and sulfa antibiotics.   Physical Exam: BP 133/78   Pulse 72   LMP  (LMP Unknown)    AAO x3   POP-Q: deferred  ---------------------------------------------------------  Assessment and Plan:  1. Overactive bladder   2. SUI (stress urinary incontinence, female)    - Prior urodynamic testing did show detrusor overactivity. We discussed that this may be causing some of her bladder leakage vs some residual SUI symptoms.  - Will plan to trial medication for OAB. Vesicare 5mg  ordered. If she does not see improvement, then will repeat urodynamic testing to look for SUI. Discussed option of "top up" with urethral bulking if she has further SUI.   Return 1 month  Anna Beards, MD

## 2022-11-25 ENCOUNTER — Other Ambulatory Visit: Payer: Self-pay

## 2022-11-29 ENCOUNTER — Encounter: Payer: Self-pay | Admitting: Obstetrics and Gynecology

## 2022-12-03 ENCOUNTER — Other Ambulatory Visit: Payer: Self-pay

## 2022-12-03 ENCOUNTER — Other Ambulatory Visit: Payer: Self-pay | Admitting: Certified Nurse Midwife

## 2022-12-05 ENCOUNTER — Other Ambulatory Visit: Payer: Self-pay

## 2022-12-05 MED ORDER — NORETHINDRONE ACET-ETHINYL EST 1.5-30 MG-MCG PO TABS
1.0000 | ORAL_TABLET | Freq: Every day | ORAL | 11 refills | Status: DC
  Filled 2022-12-05: qty 84, 84d supply, fill #0
  Filled 2023-02-28: qty 84, 84d supply, fill #1
  Filled 2023-07-17: qty 84, 84d supply, fill #2

## 2022-12-11 ENCOUNTER — Encounter: Payer: Self-pay | Admitting: Obstetrics and Gynecology

## 2022-12-11 ENCOUNTER — Other Ambulatory Visit: Payer: Self-pay

## 2022-12-11 ENCOUNTER — Ambulatory Visit (INDEPENDENT_AMBULATORY_CARE_PROVIDER_SITE_OTHER): Payer: 59 | Admitting: Obstetrics and Gynecology

## 2022-12-11 VITALS — BP 145/87 | HR 69

## 2022-12-11 DIAGNOSIS — N393 Stress incontinence (female) (male): Secondary | ICD-10-CM

## 2022-12-11 DIAGNOSIS — R339 Retention of urine, unspecified: Secondary | ICD-10-CM | POA: Diagnosis not present

## 2022-12-11 DIAGNOSIS — N3281 Overactive bladder: Secondary | ICD-10-CM

## 2022-12-11 DIAGNOSIS — R350 Frequency of micturition: Secondary | ICD-10-CM

## 2022-12-11 LAB — POCT URINALYSIS DIPSTICK
Bilirubin, UA: NEGATIVE
Blood, UA: NEGATIVE
Glucose, UA: NEGATIVE
Ketones, UA: NEGATIVE
Nitrite, UA: NEGATIVE
Protein, UA: NEGATIVE
Spec Grav, UA: 1.01 (ref 1.010–1.025)
Urobilinogen, UA: 0.2 U/dL
pH, UA: 7.5 (ref 5.0–8.0)

## 2022-12-11 MED ORDER — PHENAZOPYRIDINE HCL 95 MG PO TABS
95.0000 mg | ORAL_TABLET | Freq: Three times a day (TID) | ORAL | 1 refills | Status: DC | PRN
  Filled 2022-12-11: qty 30, 10d supply, fill #0

## 2022-12-11 MED ORDER — NITROFURANTOIN MONOHYD MACRO 100 MG PO CAPS
100.0000 mg | ORAL_CAPSULE | Freq: Two times a day (BID) | ORAL | 0 refills | Status: AC
  Filled 2022-12-11: qty 6, 3d supply, fill #0

## 2022-12-11 NOTE — Patient Instructions (Signed)
Taking Care of Yourself after Urodynamics  Drink plenty of water for a day or two following your procedure. Try to have about 8 ounces (one cup) at a time, and do this 6 times or more per day unless you have fluid restrictitons AVOID irritative beverages such as coffee, tea, soda, alcoholic or citrus drinks for a day or two, as this may cause burning with urination.  For the first 1-2 days after the procedure, your urine may be pink or red in color. You may have some blood in your urine as a normal side effect of the procedure. Large amounts of bleeding or difficulty urinating are NOT normal. Call the nurse line if this happens or go to the nearest Emergency Room if the bleeding is heavy or you cannot urinate at all and it is after hours. If you had a Bulkamid injection in the urethra and need to be catheterized, ask for a pediatric catheter to be used (size 10 or 12-French) so the material is not pushed out of place.   You may experience some discomfort or a burning sensation with urination after having this procedure. You can use over the counter Azo or pyridium to help with burning and follow the instructions on the packaging. If it does not improve within 1-2 days, or other symptoms appear (fever, chills, or difficulty urinating) call the office to speak to a nurse.  You may return to normal daily activities such as work, school, driving, exercising and housework on the day of the procedure. If your doctor gave you a prescription, take it as ordered.

## 2022-12-11 NOTE — Progress Notes (Signed)
 Urogynecology Urodynamics Procedure  Referring Physician: Glori Luis, MD Date of Procedure: 12/11/2022  Anna Kim is a 47 y.o. female who presents for urodynamic evaluation. Indication(s) for study: occult SUI  Vital Signs: BP (!) 145/87   Pulse 69   LMP  (LMP Unknown)   Laboratory Results: A catheterized urine specimen revealed:  POC urine: + Trace leukocytes   Voiding Diary: Not done  Procedure Timeout:  The correct patient was verified and the correct procedure was verified. The patient was in the correct position and safety precautions were reviewed based on at the patient's history.  Urodynamic Procedure A 65F dual lumen urodynamics catheter was placed under sterile conditions into the patient's bladder. A 65F catheter was placed into the rectum in order to measure abdominal pressure. EMG patches were placed in the appropriate position.  All connections were confirmed and calibrations/adjusted made. Saline was instilled into the bladder through the dual lumen catheters.  Cough/valsalva pressures were measured periodically during filling.  Patient was allowed to void.  The bladder was then emptied of its residual.  UROFLOW: Revealed a Qmax of 13 mL/sec.  She voided 238 mL and had a residual of 120 mL.  It was a normal pattern and represented normal habits.  CMG: This was performed with sterile water in the sitting position at a fill rate of 30 mL/min.    First sensation of fullness was 94 mLs,  First urge was 122 mLs,  Strong urge was 265 mLs and  Capacity was 581 mLs  Stress incontinence was demonstrated Highest positive CLPP was 130 cmH20 at 308 ml-While standing Highest negative VLPP was 102 cmH20at 308 ml.   Detrusor function was normal, with phasic contractions seen only with standing.  She did demonstrate increased DO response with standing  Compliance:  Low-Normal. End fill detrusor pressure was 23cmH20.  Calculated compliance was  30mL/cmH20  UPP: MUCP without barrier reduction was 86 cm of water.    MICTURITION STUDY: Voiding was performed without reduction in the sitting position.  Pdet at Qmax was 31 cm of water.  Qmax was 9.6 mL/sec.  It was a normal pattern.  She voided 256 mL and had a residual of 325 mL.  It was a volitional void, sustained detrusor contraction was present and abdominal straining was not present  EMG: This was performed with patches.  She had voluntary contractions, recruitment with fill was present and urethral sphincter was not relaxed with void.  The details of the procedure with the study tracings have been scanned into EPIC.   Urodynamic Impression:  1. Sensation was normal; capacity was normal 2. Stress Incontinence was demonstrated at normal pressures with standing; 3. Detrusor Overactivity was demonstrated without leakage and only while standing. 4. Emptying was dysfunctional with a elevated PVR ( ) , a sustained detrusor contraction present,  abdominal straining not present, dyssynergic urethral sphincter activity on EMG.  Plan: - The patient will follow up  to discuss the findings and treatment options.

## 2022-12-21 ENCOUNTER — Telehealth (INDEPENDENT_AMBULATORY_CARE_PROVIDER_SITE_OTHER): Payer: 59 | Admitting: Obstetrics and Gynecology

## 2022-12-21 DIAGNOSIS — N393 Stress incontinence (female) (male): Secondary | ICD-10-CM | POA: Diagnosis not present

## 2022-12-21 DIAGNOSIS — N3281 Overactive bladder: Secondary | ICD-10-CM | POA: Diagnosis not present

## 2022-12-21 NOTE — Progress Notes (Signed)
[facility]- video visit  Date of Visit: 12/21/2022  Patient location- work, [place] Provider location- office, [place]  The patient consented to video visit and was the only person on the call.   History of Present Illness: Anna Kim is a 47 y.o. female scheduled today for a follow up after urodynamic testing.   Surgery: s/p Midurethral sling (Advantage Fit), cystoscopy on 04/05/22  Urodynamic Impression:  1. Sensation was normal; capacity was normal 2. Stress Incontinence was demonstrated at normal pressures with standing; 3. Detrusor Overactivity was demonstrated without leakage and only while standing. 4. Emptying was dysfunctional with a elevated PVR ( ) , a sustained detrusor contraction present,  abdominal straining not present, dyssynergic urethral sphincter activity on EMG.  Medications: She has a current medication list which includes the following prescription(s): amlodipine, cholecalciferol, fluticasone, ipratropium, levocetirizine, magic mouthwash (lidocaine, diphenhydrAMINE, alum & mag hydroxide) suspension, montelukast, norethindrone acetate-ethinyl estradiol, omeprazole, phenazopyridine, pravastatin, solifenacin, and [DISCONTINUED] calcium carb-cholecalciferol.   Allergies: Patient is allergic to ceftriaxone, erythromycin, and sulfa antibiotics.   Physical Exam: There were no vitals taken for this visit.   AAO x3   ---------------------------------------------------------  Assessment and Plan:  1. SUI (stress urinary incontinence, female)   2. Detrusor dyssynergia    - We discussed that urodynamic testing showed both SUI and detrusor overactivity. However, there is concern she is not emptying her bladder completely. She reports that she did not feel relaxed with the catheter in during the study and it made it hard for her to void.  - On review of the study, she has a prolonged initial void. On micturition study she had two detrusor contractions with normal  pressures, however, she had abdominal straining and increased EMG activity throughout. Therefore, suspect that she has more dyssynergia rather than obstruction from the sling.  - Offered pelvic PT for the dyssynergia and incontinence symptoms.  - We discussed option of obtaining PVRs at home with self cath for a few days. She would rather start with physical therapy first. Will place referral to [facility] rehab.   Return 2-3 months  Marguerita Beards, MD  Time spent: I spent 20 minutes dedicated to the care of this patient on the date of this encounter to include pre-visit review of records, face-to-face time with the patient and post visit documentation.

## 2023-01-03 ENCOUNTER — Encounter: Payer: Self-pay | Admitting: Obstetrics and Gynecology

## 2023-01-13 MED FILL — Omeprazole Cap Delayed Release 20 MG: ORAL | 90 days supply | Qty: 90 | Fill #1 | Status: AC

## 2023-02-26 ENCOUNTER — Ambulatory Visit (INDEPENDENT_AMBULATORY_CARE_PROVIDER_SITE_OTHER): Payer: 59 | Admitting: Family Medicine

## 2023-02-26 VITALS — BP 132/80 | HR 77 | Temp 98.5°F | Ht 66.0 in | Wt 180.0 lb

## 2023-02-26 DIAGNOSIS — R12 Heartburn: Secondary | ICD-10-CM | POA: Diagnosis not present

## 2023-02-26 DIAGNOSIS — R7303 Prediabetes: Secondary | ICD-10-CM

## 2023-02-26 DIAGNOSIS — I10 Essential (primary) hypertension: Secondary | ICD-10-CM | POA: Diagnosis not present

## 2023-02-26 LAB — POCT GLYCOSYLATED HEMOGLOBIN (HGB A1C): Hemoglobin A1C: 5.5 % (ref 4.0–5.6)

## 2023-02-26 NOTE — Assessment & Plan Note (Signed)
>>  ASSESSMENT AND PLAN FOR [facility] WRITTEN ON 02/26/2023  8:52 AM BY SONNENBERG, ERIC G, MD  Chronic issue.  Adequately controlled.  Continue Prilosec 20 mg daily.

## 2023-02-26 NOTE — Assessment & Plan Note (Signed)
Chronic issue.  Adequately controlled at home.  Patient will continue amlodipine 5 mg daily.

## 2023-02-26 NOTE — Assessment & Plan Note (Signed)
Chronic issue.  Adequately controlled.  Continue Prilosec 20 mg daily.

## 2023-02-26 NOTE — Progress Notes (Signed)
Marikay Alar, MD Phone: [phone]  Anna Kim is a 47 y.o. female who presents today for f/u.  HYPERTENSION Disease Monitoring Home BP Monitoring 120/80 Chest pain- no    Dyspnea- no Medications Compliance-  taking amlodipine.  Edema- no BMET    Component Value Date/Time   NA 138 08/21/2022 0852   NA 139 03/22/2015 1123   K 3.6 08/21/2022 0852   CL 102 08/21/2022 0852   CO2 28 08/21/2022 0852   GLUCOSE 89 08/21/2022 0852   BUN 14 08/21/2022 0852   BUN 9 03/22/2015 1123   CREATININE 0.80 08/21/2022 0852   CREATININE 0.69 11/16/2016 1601   CALCIUM 9.2 08/21/2022 0852   GFRNONAA >60 05/18/2020 0723   GFRAA >60 03/06/2019 1542   GERD:   Reflux symptoms: no   Abd pain: no   Blood in stool: no  Dysphagia: no   EGD: no  Medication: omeprazole  Prediabetes: Patient is walking 3 times a week for 30 minutes for exercise.  She is trying to eat more meals at home.  Occasionally has sweets.  1 regular soda per day.  Social History   Tobacco Use  Smoking Status Never  Smokeless Tobacco Never    Current Outpatient Medications on File Prior to Visit  Medication Sig Dispense Refill   amLODipine (NORVASC) 5 MG tablet Take 1 tablet (5 mg total) by mouth daily. 90 tablet 2   Cholecalciferol 125 MCG (5000 UT) TABS cholecalciferol (vitamin D3) 125 mcg (5,000 unit) tablet     levocetirizine (XYZAL) 5 MG tablet Take 5 mg by mouth every evening.     montelukast (SINGULAIR) 10 MG tablet Take 1 tablet (10 mg total) by mouth at bedtime. 30 tablet 3   Norethindrone Acetate-Ethinyl Estradiol (LARIN 1.5/30) 1.5-30 MG-MCG tablet Take 1 tablet by mouth daily. 21 tablet 11   omeprazole (PRILOSEC) 20 MG capsule Take 1 capsule (20 mg total) by mouth daily. 90 capsule 1   pravastatin (PRAVACHOL) 40 MG tablet Take 1 tablet (40 mg total) by mouth daily. 90 tablet 3   [DISCONTINUED] Calcium Carb-Cholecalciferol (CALCIUM + D3 PO) Take 1 tablet by mouth daily.     No current  facility-administered medications on file prior to visit.     ROS see history of present illness  Objective  Physical Exam Vitals:   02/26/23 0834 02/26/23 0859  BP: (!) 142/80 132/80  Pulse: 77   Temp: 98.5 F (36.9 C)   SpO2: 98%     BP Readings from Last 3 Encounters:  02/26/23 132/80  12/11/22 (!) 145/87  11/16/22 133/78   Wt Readings from Last 3 Encounters:  02/26/23 180 lb (81.6 kg)  08/21/22 179 lb 3.2 oz (81.3 kg)  07/13/22 176 lb (79.8 kg)    Physical Exam Constitutional:      General: She is not in acute distress.    Appearance: She is not diaphoretic.  Cardiovascular:     Rate and Rhythm: Normal rate and regular rhythm.     Heart sounds: Normal heart sounds.  Pulmonary:     Effort: Pulmonary effort is normal.     Breath sounds: Normal breath sounds.  Musculoskeletal:     Right lower leg: No edema.     Left lower leg: No edema.  Skin:    General: Skin is warm and dry.  Neurological:     Mental Status: She is alert.      Assessment/Plan: Please see individual problem list.  Primary hypertension Assessment & Plan: Chronic issue.  Adequately controlled at home.  Patient will continue amlodipine 5 mg daily.   Prediabetes Assessment & Plan: Chronic issue.  Check A1c.  Encouraged healthy diet and exercise.  Discussed reducing sugar and carbohydrate intake.  Orders: -     POCT glycosylated hemoglobin (Hb A1C)  Heartburn Assessment & Plan: Chronic issue.  Adequately controlled.  Continue Prilosec 20 mg daily.     Return for as scheduled, physical at end of May.   Marikay Alar, MD [facility]

## 2023-02-26 NOTE — Assessment & Plan Note (Signed)
Chronic issue.  Check A1c.  Encouraged healthy diet and exercise.  Discussed reducing sugar and carbohydrate intake.

## 2023-02-27 ENCOUNTER — Ambulatory Visit: Payer: 59 | Admitting: Obstetrics and Gynecology

## 2023-02-28 ENCOUNTER — Other Ambulatory Visit: Payer: Self-pay | Admitting: Family Medicine

## 2023-02-28 ENCOUNTER — Other Ambulatory Visit: Payer: Self-pay

## 2023-02-28 DIAGNOSIS — J309 Allergic rhinitis, unspecified: Secondary | ICD-10-CM

## 2023-03-01 ENCOUNTER — Other Ambulatory Visit: Payer: Self-pay | Admitting: Family Medicine

## 2023-03-01 ENCOUNTER — Other Ambulatory Visit: Payer: Self-pay

## 2023-03-01 DIAGNOSIS — J309 Allergic rhinitis, unspecified: Secondary | ICD-10-CM

## 2023-03-04 ENCOUNTER — Other Ambulatory Visit: Payer: Self-pay

## 2023-03-04 MED FILL — Montelukast Sodium Tab 10 MG (Base Equiv): ORAL | 30 days supply | Qty: 30 | Fill #0 | Status: AC

## 2023-03-05 ENCOUNTER — Other Ambulatory Visit: Payer: Self-pay

## 2023-04-07 ENCOUNTER — Telehealth: Payer: Commercial Managed Care - PPO | Admitting: Family Medicine

## 2023-04-07 DIAGNOSIS — A084 Viral intestinal infection, unspecified: Secondary | ICD-10-CM | POA: Diagnosis not present

## 2023-04-07 NOTE — Progress Notes (Signed)
 Virtual Visit Consent   [facility] Anna Kim, you are scheduled for a virtual visit with a [facility] provider today. Just as with appointments in the office, your consent must be obtained to participate. Your consent will be active for this visit and any virtual visit you may have with one of our providers in the next 365 days. If you have a MyChart account, a copy of this consent can be sent to you electronically.  As this is a virtual visit, video technology does not allow for your provider to perform a traditional examination. This may limit your provider's ability to fully assess your condition. If your provider identifies any concerns that need to be evaluated in person or the need to arrange testing (such as labs, EKG, etc.), we will make arrangements to do so. Although advances in technology are sophisticated, we cannot ensure that it will always work on either your end or our end. If the connection with a video visit is poor, the visit may have to be switched to a telephone visit. With either a video or telephone visit, we are not always able to ensure that we have a secure connection.  By engaging in this virtual visit, you consent to the provision of healthcare and authorize for your insurance to be billed (if applicable) for the services provided during this visit. Depending on your insurance coverage, you may receive a charge related to this service.  I need to obtain your verbal consent now. Are you willing to proceed with your visit today? [name] has provided verbal consent on 04/07/2023 for a virtual visit (video or telephone). Loa Lamp, FNP  Date: 04/07/2023 5:29 PM  Virtual Visit via Video Note   I, Loa Lamp, connected with  [name]  ([id number], 1976-01-24) on 04/07/23 at  5:30 PM EST by a video-enabled telemedicine application and verified that I am speaking with the correct person using two identifiers.  Location: Patient: Virtual Visit Location Patient:  Home Provider: Virtual Visit Location Provider: Home Office   I discussed the limitations of evaluation and management by telemedicine and the availability of in person appointments. The patient expressed understanding and agreed to proceed.    History of Present Illness: Anna Kim is a 48 y.o. who identifies as a female who was assigned female at birth, and is being seen today for diarrhea since Tuesday. Tuesday night she had nausea and diarrhea, she feels much better but still has diarrhea after eating. No fever and no abd pain. Anna Kim  HPI: HPI  Problems:  Patient Active Problem List   Diagnosis Date Noted   Encounter for general adult medical examination with abnormal findings 08/21/2022   Finger pain, left 08/21/2022   Colon cancer screening 07/13/2022   Complete tear of ulnar collateral ligament of metacarpophalangeal (MCP) joint of finger 02/09/2022   Heartburn 02/01/2021   Right ankle sprain 02/01/2021   Joint pain 08/01/2020   Need for hepatitis C screening test 06/04/2020   Neck pain 12/15/2019   Overweight 07/20/2019   Hypertension 03/11/2019   Vitamin D  deficiency 03/11/2019   Allergic rhinitis 07/30/2018   Left foot pain 03/04/2018   Skin exam, screening for cancer 03/04/2018   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   Prediabetes 01/20/2016   Hyperlipidemia 01/20/2016    Allergies:  Allergies  Allergen Reactions   Ceftriaxone  Hives   Erythromycin Other (See Comments)    GI Upset   Sulfa Antibiotics Rash   Medications:  Current Outpatient Medications:  amLODipine  (NORVASC ) 5 MG tablet, Take 1 tablet (5 mg total) by mouth daily., Disp: 90 tablet, Rfl: 2   Cholecalciferol 125 MCG (5000 UT) TABS, cholecalciferol (vitamin D3) 125 mcg (5,000 unit) tablet, Disp: , Rfl:    levocetirizine (XYZAL) 5 MG tablet, Take 5 mg by mouth every evening., Disp: , Rfl:    montelukast  (SINGULAIR ) 10 MG tablet, Take 1 tablet (10 mg total) by mouth at bedtime., Disp: 30 tablet, Rfl: 3    Norethindrone  Acetate-Ethinyl Estradiol  (LARIN  1.5/30) 1.5-30 MG-MCG tablet, Take 1 tablet by mouth daily., Disp: 21 tablet, Rfl: 11   omeprazole  (PRILOSEC) 20 MG capsule, Take 1 capsule (20 mg total) by mouth daily., Disp: 90 capsule, Rfl: 1   pravastatin  (PRAVACHOL ) 40 MG tablet, Take 1 tablet (40 mg total) by mouth daily., Disp: 90 tablet, Rfl: 3  Observations/Objective: Patient is well-developed, well-nourished in no acute distress.  Resting comfortably  at home.  Head is normocephalic, atraumatic.  No labored breathing.  Speech is clear and coherent with logical content.  Patient is alert and oriented at baseline.    Assessment and Plan: 1. Viral gastroenteritis (Primary)  Increase fluids, no milk or dairy, BRATT diet, UC if sx persist or worsen.   Follow Up Instructions: I discussed the assessment and treatment plan with the patient. The patient was provided an opportunity to ask questions and all were answered. The patient agreed with the plan and demonstrated an understanding of the instructions.  A copy of instructions were sent to the patient via MyChart unless otherwise noted below.     The patient was advised to call back or seek an in-person evaluation if the symptoms worsen or if the condition fails to improve as anticipated.    Chancey Ringel, FNP

## 2023-04-07 NOTE — Patient Instructions (Signed)
 Diarrhea, Adult Diarrhea is frequent loose and sometimes watery bowel movements. Diarrhea can make you feel weak and cause you to become dehydrated. Dehydration is a condition in which there is not enough water or other fluids in the body. Dehydration can make you tired and thirsty, cause you to have a dry mouth, and decrease how often you urinate. Diarrhea typically lasts 2-3 days. However, it can last longer if it is a sign of something more serious. It is important to treat your diarrhea as told by your health care provider. Follow these instructions at home: Eating and drinking     Follow these recommendations as told by your health care provider: Take an oral rehydration solution (ORS). This is an over-the-counter medicine that helps return your body to its normal balance of nutrients and water. It is found at pharmacies and retail stores. Drink enough fluid to keep your urine pale yellow. Drink fluids such as water, diluted fruit juice, and low-calorie sports drinks. You can drink milk also, if desired. Sucking on ice chips is another way to get fluids. Avoid drinking fluids that contain a lot of sugar or caffeine, such as soda, energy drinks, and regular sports drinks. Avoid alcohol. Eat bland, easy-to-digest foods in small amounts as you are able. These foods include bananas, applesauce, rice, lean meats, toast, and crackers. Avoid spicy or fatty foods.  Medicines Take over-the-counter and prescription medicines only as told by your health care provider. If you were prescribed antibiotics, take them as told by your health care provider. Do not stop using the antibiotic even if you start to feel better. General instructions  Wash your hands often using soap and water for at least 20 seconds. If soap and water are not available, use hand sanitizer. Others in the household should wash their hands as well. Hands should be washed: After using the toilet or changing a diaper. Before  preparing, cooking, or serving food. While caring for a sick person or while visiting someone in a hospital. Rest at home while you recover. Take a warm bath to relieve any burning or pain from frequent diarrhea episodes. Watch your condition for any changes. Contact a health care provider if: You have a fever. Your diarrhea gets worse. You have new symptoms. You vomit every time you eat or drink. You feel light-headed, dizzy, or have a headache. You have muscle cramps. You have signs of dehydration, such as: Dark urine, very little urine, or no urine. Cracked lips. Dry mouth. Sunken eyes. Sleepiness. Weakness. You have bloody or black stools or stools that look like tar. You have severe pain, cramping, or bloating in your abdomen. Your skin feels cold and clammy. You feel confused. Get help right away if: You have chest pain or your heart is beating very quickly. You have trouble breathing or you are breathing very quickly. You feel extremely weak or you faint. These symptoms may be an emergency. Get help right away. Call 911. Do not wait to see if the symptoms will go away. Do not drive yourself to the hospital. This information is not intended to replace advice given to you by your health care provider. Make sure you discuss any questions you have with your health care provider. Document Revised: 08/29/2021 Document Reviewed: 08/29/2021 Elsevier Patient Education  2024 [employer].

## 2023-04-10 ENCOUNTER — Other Ambulatory Visit: Payer: Self-pay

## 2023-04-10 ENCOUNTER — Other Ambulatory Visit: Payer: Self-pay | Admitting: Family Medicine

## 2023-04-10 ENCOUNTER — Ambulatory Visit: Payer: Self-pay | Admitting: Family Medicine

## 2023-04-10 DIAGNOSIS — R12 Heartburn: Secondary | ICD-10-CM

## 2023-04-10 MED ORDER — OMEPRAZOLE 20 MG PO CPDR
20.0000 mg | DELAYED_RELEASE_CAPSULE | Freq: Every day | ORAL | 1 refills | Status: DC
Start: 2023-04-10 — End: 2023-08-23
  Filled 2023-04-10: qty 90, 90d supply, fill #0
  Filled 2023-07-17: qty 90, 90d supply, fill #1

## 2023-04-10 MED FILL — Montelukast Sodium Tab 10 MG (Base Equiv): ORAL | 30 days supply | Qty: 30 | Fill #1 | Status: AC

## 2023-04-10 NOTE — Telephone Encounter (Signed)
 Copied from CRM #[id number]. Topic: Clinical - Red Word Triage >> Apr 10, 2023  3:12 PM Lenon Radar A wrote: Red Word that prompted transfer to Nurse Triage: Diarrhea   Chief Complaint: Diarrhea x 8 days Symptoms: 4-6 watery stools per day Frequency: constant Pertinent Negatives: Patient denies abdominal pain or fever Disposition: [] ED /[] Urgent Care (no appt availability in office) / [x] Appointment(In office/virtual)/ []  [place] [facility]/ [] Home Care/ [] Refused Recommended Disposition /[] [place] [facility]/ []  Follow-up with PCP  Additional Notes: Patient reports diarrhea x 8 day, seen on 01/12 at virtual visit and advised to do BRAT diet and take OTC Immodium. She was also advised to f/u with PCP if symptoms persist. Appt scheduled for 01/16   Reason for Disposition  [1] MODERATE diarrhea (e.g., 4-6 times / day more than normal) AND [2] present > 48 hours (2 days)  Answer Assessment - Initial Assessment Questions 1. DIARRHEA SEVERITY: "How bad is the diarrhea?" "How many more stools have you had in the past 24 hours than normal?"    - NO DIARRHEA (SCALE 0)   - MILD (SCALE 1-3): Few loose or mushy BMs; increase of 1-3 stools over normal daily number of stools; mild increase in ostomy output.   -  MODERATE (SCALE 4-7): Increase of 4-6 stools daily over normal; moderate increase in ostomy output.   -  SEVERE (SCALE 8-10; OR "WORST POSSIBLE"): Increase of 7 or more stools daily over normal; moderate increase in ostomy output; incontinence.     Moderate diarrhea, 4-6 stools a day  2. ONSET: "When did the diarrhea begin?"      1 Week ago  3. BM CONSISTENCY: "How loose or watery is the diarrhea?"      Watery  4. VOMITING: "Are you also vomiting?" If Yes, ask: "How many times in the past 24 hours?"      Was vomiting the first day, but not vomiting any more  5. ABDOMEN PAIN: "Are you having any abdomen pain?" If Yes, ask: "What does it feel like?" (e.g., crampy, dull, intermittent,  constant)      No pain  6. ABDOMEN PAIN SEVERITY: If present, ask: "How bad is the pain?"  (e.g., Scale 1-10; mild, moderate, or severe)   - MILD (1-3): doesn't interfere with normal activities, abdomen soft and not tender to touch    - MODERATE (4-7): interferes with normal activities or awakens from sleep, abdomen tender to touch    - SEVERE (8-10): excruciating pain, doubled over, unable to do any normal activities       No pain  7. ORAL INTAKE: If vomiting, "Have you been able to drink liquids?" "How much liquids have you had in the past 24 hours?"     Drink okay  8. HYDRATION: "Any signs of dehydration?" (e.g., dry mouth [not just dry lips], too weak to stand, dizziness, new weight loss) "When did you last urinate?"     Not dehydrated  9. EXPOSURE: "Have you traveled to a foreign country recently?" "Have you been exposed to anyone with diarrhea?" "Could you have eaten any food that was spoiled?"     No travle, unsure of exposure  10. ANTIBIOTIC USE: "Are you taking antibiotics now or have you taken antibiotics in the past 2 months?"       No abx  11. OTHER SYMPTOMS: "Do you have any other symptoms?" (e.g., fever, blood in stool)       No other symptom  12. PREGNANCY: "Is there any chance  you are pregnant?" "When was your last menstrual period?"       No  Protocols used: [facility]

## 2023-04-11 ENCOUNTER — Other Ambulatory Visit: Payer: Self-pay

## 2023-04-11 ENCOUNTER — Ambulatory Visit: Payer: Commercial Managed Care - PPO | Admitting: Nurse Practitioner

## 2023-04-11 ENCOUNTER — Other Ambulatory Visit: Payer: Self-pay | Admitting: Family

## 2023-04-11 ENCOUNTER — Encounter: Payer: Self-pay | Admitting: Nurse Practitioner

## 2023-04-11 VITALS — BP 120/78 | HR 76 | Temp 98.1°F | Ht 66.0 in | Wt 170.8 lb

## 2023-04-11 DIAGNOSIS — R197 Diarrhea, unspecified: Secondary | ICD-10-CM | POA: Insufficient documentation

## 2023-04-11 DIAGNOSIS — E876 Hypokalemia: Secondary | ICD-10-CM | POA: Diagnosis not present

## 2023-04-11 LAB — COMPREHENSIVE METABOLIC PANEL
ALT: 29 U/L (ref 0–35)
AST: 22 U/L (ref 0–37)
Albumin: 4.3 g/dL (ref 3.5–5.2)
Alkaline Phosphatase: 72 U/L (ref 39–117)
BUN: 7 mg/dL (ref 6–23)
CO2: 28 meq/L (ref 19–32)
Calcium: 9.6 mg/dL (ref 8.4–10.5)
Chloride: 98 meq/L (ref 96–112)
Creatinine, Ser: 0.8 mg/dL (ref 0.40–1.20)
GFR: 86.67 mL/min (ref >60.00)
Glucose, Bld: 77 mg/dL (ref 70–99)
Potassium: 2.9 meq/L — ABNORMAL LOW (ref 3.5–5.1)
Sodium: 136 meq/L (ref 135–145)
Total Bilirubin: 0.4 mg/dL (ref 0.2–1.2)
Total Protein: 7.6 g/dL (ref 6.0–8.3)

## 2023-04-11 LAB — CBC
HCT: 37.9 % (ref 36.0–46.0)
Hemoglobin: 12.7 g/dL (ref 12.0–15.0)
MCHC: 33.4 g/dL (ref 30.0–36.0)
MCV: 92.7 fL (ref 78.0–100.0)
Platelets: 292 10*3/uL (ref 150.0–400.0)
RBC: 4.09 Mil/uL (ref 3.87–5.11)
RDW: 12.8 % (ref 11.5–15.5)
WBC: 8.2 10*3/uL (ref 4.0–10.5)

## 2023-04-11 MED ORDER — POTASSIUM CHLORIDE CRYS ER 10 MEQ PO TBCR
20.0000 meq | EXTENDED_RELEASE_TABLET | Freq: Three times a day (TID) | ORAL | 0 refills | Status: DC
Start: 2023-04-11 — End: 2023-04-29
  Filled 2023-04-11 – 2023-04-12 (×2): qty 12, 2d supply, fill #0

## 2023-04-11 NOTE — Addendum Note (Signed)
Addended by: Kara Dies on: 04/11/2023 05:52 PM   Modules accepted: Orders

## 2023-04-11 NOTE — Patient Instructions (Addendum)
We will order a stool culture and blood work. Continue with the BRAT diet, start taking probiotics, and increase your fluid intake with Pedialyte or Gatorade. You may also consider eating yogurt for gut health but avoid other dairy products, beans, and spicy foods.  Diarrhea, Adult Diarrhea is frequent loose and sometimes watery bowel movements. Diarrhea can make you feel weak and cause you to become dehydrated. Dehydration is a condition in which there is not enough water or other fluids in the body. Dehydration can make you tired and thirsty, cause you to have a dry mouth, and decrease how often you urinate. Diarrhea typically lasts 2-3 days. However, it can last longer if it is a sign of something more serious. It is important to treat your diarrhea as told by your health care provider. Follow these instructions at home: Eating and drinking     Follow these recommendations as told by your health care provider: Take an oral rehydration solution (ORS). This is an over-the-counter medicine that helps return your body to its normal balance of nutrients and water. It is found at pharmacies and retail stores. Drink enough fluid to keep your urine pale yellow. Drink fluids such as water, diluted fruit juice, and low-calorie sports drinks. You can drink milk also, if desired. Sucking on ice chips is another way to get fluids. Avoid drinking fluids that contain a lot of sugar or caffeine, such as soda, energy drinks, and regular sports drinks. Avoid alcohol. Eat bland, easy-to-digest foods in small amounts as you are able. These foods include bananas, applesauce, rice, lean meats, toast, and crackers. Avoid spicy or fatty foods.  Medicines Take over-the-counter and prescription medicines only as told by your health care provider. If you were prescribed antibiotics, take them as told by your health care provider. Do not stop using the antibiotic even if you start to feel better. General  instructions  Wash your hands often using soap and water for at least 20 seconds. If soap and water are not available, use hand sanitizer. Others in the household should wash their hands as well. Hands should be washed: After using the toilet or changing a diaper. Before preparing, cooking, or serving food. While caring for a sick person or while visiting someone in a hospital. Rest at home while you recover. Take a warm bath to relieve any burning or pain from frequent diarrhea episodes. Watch your condition for any changes. Contact a health care provider if: You have a fever. Your diarrhea gets worse. You have new symptoms. You vomit every time you eat or drink. You feel light-headed, dizzy, or have a headache. You have muscle cramps. You have signs of dehydration, such as: Dark urine, very little urine, or no urine. Cracked lips. Dry mouth. Sunken eyes. Sleepiness. Weakness. You have bloody or black stools or stools that look like tar. You have severe pain, cramping, or bloating in your abdomen. Your skin feels cold and clammy. You feel confused. Get help right away if: You have chest pain or your heart is beating very quickly. You have trouble breathing or you are breathing very quickly. You feel extremely weak or you faint. These symptoms may be an emergency. Get help right away. Call 911. Do not wait to see if the symptoms will go away. Do not drive yourself to the hospital. This information is not intended to replace advice given to you by your health care provider. Make sure you discuss any questions you have with your health care provider. Document  Revised: 08/29/2021 Document Reviewed: 08/29/2021 Elsevier Patient Education  2024 [employer].

## 2023-04-11 NOTE — Progress Notes (Signed)
Established Patient Office Visit  Subjective:  Patient ID: Anna Kim, female    DOB: 05-20-1975  Age: 48 y.o. MRN: [id number]  CC:  Chief Complaint  Patient presents with   Acute Visit    Diarrhea x 1 week -no abdominal pain    HPI  Anna Kim presents with a week-long history of gastrointestinal symptoms, beginning with stomach pains and extreme fatigue, which led to leaving work early. The initial symptoms were accompanied by vomiting twice on the first day, followed by persistent diarrhea. The patient reported spending the first two days primarily in bed, only getting up to use the bathroom. By the third day, the patient began to feel better and was able to move around, but the diarrhea persisted.  The patient sought virtual medical advice on the fifth day, and was told that the symptoms were consistent with norovirus. The patient was advised that they were no longer contagious and could return to work if tolerated. However, the diarrhea continued, occurring multiple times a day, including during the night.The patient denied any abdominal pain and reported only one episode of vomiting at the onset of the illness. There was no recent antibiotic use.   The patient attempted to manage the symptoms with a diet of crackers, Jell-O, toast, and bananas, as well as over-the-counter medications including Imodium. However, the diarrhea persisted. The stool was described as initially green, then brown, and watery, with no blood or mucus.  The patient's bowel movements were typically once every couple of days, and there was no history of constipation or diarrhea prior to this episode. The patient reported no bloating or fever. The patient's symptoms did not appear to be influenced by diet.  HPI   Past Medical History:  Diagnosis Date   History of hypertension 01/20/2016   HLD (hyperlipidemia)    HTN (hypertension)    Migraine    Ureteral stone    Vaginal Pap smear, abnormal     Remote history of 1 abnormal pap    Past Surgical History:  Procedure Laterality Date   BLADDER SUSPENSION N/A 04/05/2022   Procedure: TRANSVAGINAL TAPE (TVT) PROCEDURE;  Surgeon: Marguerita Beards, MD;  Location: [facility];  Service: Gynecology;  Laterality: N/A;  total time requested is 1 hour   COLONOSCOPY WITH PROPOFOL N/A 07/13/2022   Procedure: COLONOSCOPY WITH PROPOFOL;  Surgeon: Midge Minium, MD;  Location: [facility];  Service: Endoscopy;  Laterality: N/A;   CYSTOSCOPY N/A 04/05/2022   Procedure: CYSTOSCOPY;  Surgeon: Marguerita Beards, MD;  Location: [facility];  Service: Gynecology;  Laterality: N/A;   TONSILLECTOMY  2011    Family History  Problem Relation Age of Onset   Cancer Maternal Aunt        ovarian   Hypertension Mother    Hypertension Father    Atrial fibrillation Father    Kidney cancer Neg Hx    Bladder Cancer Neg Hx    Breast cancer Neg Hx     Social History   Socioeconomic History   Marital status: Divorced    Spouse name: Not on file   Number of children: Not on file   Years of education: Not on file   Highest education level: Bachelor's degree (e.g., BA, AB, BS)  Occupational History   Not on file  Tobacco Use   Smoking status: Never   Smokeless tobacco: Never  Vaping Use   Vaping status: Never Used  Substance and Sexual Activity  Alcohol use: Yes    Comment: occas   Drug use: No   Sexual activity: Not Currently    Birth control/protection: Pill  Other Topics Concern   Not on file  Social History Narrative   Not on file   Social Drivers of Health   Financial Resource Strain: Low Risk  (04/10/2023)   Overall Financial Resource Strain (CARDIA)    Difficulty of Paying Living Expenses: Not very hard  Food Insecurity: No Food Insecurity (04/10/2023)   Hunger Vital Sign    Worried About Running Out of Food in the Last Year: Never true    Ran Out of Food in the Last Year: Never true   Transportation Needs: No Transportation Needs (04/10/2023)   PRAPARE - Administrator, Civil Service (Medical): No    Lack of Transportation (Non-Medical): No  Physical Activity: Insufficiently Active (04/10/2023)   Exercise Vital Sign    Days of Exercise per Week: 3 days    Minutes of Exercise per Session: 30 min  Stress: No Stress Concern Present (04/10/2023)   [employer] of Occupational Health - Occupational Stress Questionnaire    Feeling of Stress : Only a little  Social Connections: Moderately Integrated (04/10/2023)   Social Connection and Isolation Panel [NHANES]    Frequency of Communication with Friends and Family: More than three times a week    Frequency of Social Gatherings with Friends and Family: Three times a week    Attends Religious Services: More than 4 times per year    Active Member of Clubs or Organizations: Yes    Attends Engineer, structural: More than 4 times per year    Marital Status: Divorced  Catering manager Violence: Not on file     Outpatient Medications Prior to Visit  Medication Sig Dispense Refill   amLODipine (NORVASC) 5 MG tablet Take 1 tablet (5 mg total) by mouth daily. 90 tablet 2   Cholecalciferol 125 MCG (5000 UT) TABS cholecalciferol (vitamin D3) 125 mcg (5,000 unit) tablet     levocetirizine (XYZAL) 5 MG tablet Take 5 mg by mouth every evening.     montelukast (SINGULAIR) 10 MG tablet Take 1 tablet (10 mg total) by mouth at bedtime. 30 tablet 3   Norethindrone Acetate-Ethinyl Estradiol (LARIN 1.5/30) 1.5-30 MG-MCG tablet Take 1 tablet by mouth daily. 21 tablet 11   omeprazole (PRILOSEC) 20 MG capsule Take 1 capsule (20 mg total) by mouth daily. 90 capsule 1   pravastatin (PRAVACHOL) 40 MG tablet Take 1 tablet (40 mg total) by mouth daily. 90 tablet 3   No facility-administered medications prior to visit.    Allergies  Allergen Reactions   Ceftriaxone Hives   Erythromycin Other (See Comments)    GI Upset    Sulfa Antibiotics Rash    ROS Review of Systems Negative unless indicated in HPI.    Objective:    Physical Exam Constitutional:      Appearance: Normal appearance.  HENT:     Mouth/Throat:     Mouth: Mucous membranes are moist.  Cardiovascular:     Rate and Rhythm: Normal rate and regular rhythm.     Pulses: Normal pulses.     Heart sounds: Normal heart sounds.  Pulmonary:     Effort: Pulmonary effort is normal.     Breath sounds: Normal breath sounds.  Abdominal:     General: Bowel sounds are normal.     Palpations: Abdomen is soft. There is no mass.  Tenderness: There is no abdominal tenderness.     Hernia: No hernia is present.  Musculoskeletal:     Cervical back: Normal range of motion.  Neurological:     General: No focal deficit present.     Mental Status: She is alert. Mental status is at baseline.  Psychiatric:        Mood and Affect: Mood normal.        Behavior: Behavior normal.        Thought Content: Thought content normal.        Judgment: Judgment normal.     BP 120/78   Pulse 76   Temp 98.1 F (36.7 C)   Ht 5\' 6"  (1.676 m)   Wt 170 lb 12.8 oz (77.5 kg)   SpO2 99%   BMI 27.57 kg/m  Wt Readings from Last 3 Encounters:  04/11/23 170 lb 12.8 oz (77.5 kg)  02/26/23 180 lb (81.6 kg)  08/21/22 179 lb 3.2 oz (81.3 kg)     Health Maintenance  Topic Date Due   COVID-19 Vaccine (3 - 2024-25 season) 04/27/2023 (Originally 11/25/2022)   Cervical Cancer Screening (HPV/Pap Cotest)  04/25/2025   DTaP/Tdap/Td (2 - Tdap) 11/23/2026   Colonoscopy  07/12/2032   INFLUENZA VACCINE  Completed   Hepatitis C Screening  Completed   HIV Screening  Addressed   HPV VACCINES  Aged Out    There are no preventive care reminders to display for this patient.  Lab Results  Component Value Date   TSH 1.63 08/21/2022   Lab Results  Component Value Date   WBC 8.2 08/21/2022   HGB 13.3 08/21/2022   HCT 40.5 08/21/2022   MCV 95.7 08/21/2022   PLT 235.0  08/21/2022   Lab Results  Component Value Date   NA 138 08/21/2022   K 3.6 08/21/2022   CO2 28 08/21/2022   GLUCOSE 89 08/21/2022   BUN 14 08/21/2022   CREATININE 0.80 08/21/2022   BILITOT 0.5 08/21/2022   ALKPHOS 75 08/21/2022   AST 17 08/21/2022   ALT 11 08/21/2022   PROT 7.1 08/21/2022   ALBUMIN 4.1 08/21/2022   CALCIUM 9.2 08/21/2022   ANIONGAP 8 05/18/2020   GFR 87.06 08/21/2022   Lab Results  Component Value Date   CHOL 162 08/21/2022   Lab Results  Component Value Date   HDL 51.70 08/21/2022   Lab Results  Component Value Date   LDLCALC 90 08/21/2022   Lab Results  Component Value Date   TRIG 100.0 08/21/2022   Lab Results  Component Value Date   CHOLHDL 3 08/21/2022   Lab Results  Component Value Date   HGBA1C 5.5 02/26/2023      Assessment & Plan:  Diarrhea, unspecified type Assessment & Plan: Persistent diarrhea for over a week, initially with vomiting and fatigue, now resolved. No abdominal pain, no blood or mucus in stool. No recent antibiotic use. No improvement with BRAT diet and over-the-counter medications. Suspected norovirus infection based on virtual consultation. -Order stool culture. -Order CBC and CMP to assess electrolyte balance. -Continue BRAT diet. -Start probiotics. -Increase fluid intake with Pedialyte or Gatorade to replenish electrolytes. -Consider yogurt for gut health, avoid other dairy products, beans, and spicy foods.  Orders: -     CBC -     Comprehensive metabolic panel -     Stool culture; Future    Follow-up: Return if symptoms worsen or fail to improve.   Kara Dies, NP

## 2023-04-11 NOTE — Assessment & Plan Note (Signed)
Persistent diarrhea for over a week, initially with vomiting and fatigue, now resolved. No abdominal pain, no blood or mucus in stool. No recent antibiotic use. No improvement with BRAT diet and over-the-counter medications. Suspected norovirus infection based on virtual consultation. -Order stool culture. -Order CBC and CMP to assess electrolyte balance. -Continue BRAT diet. -Start probiotics. -Increase fluid intake with Pedialyte or Gatorade to replenish electrolytes. -Consider yogurt for gut health, avoid other dairy products, beans, and spicy foods.

## 2023-04-12 ENCOUNTER — Other Ambulatory Visit: Payer: Self-pay | Admitting: Nurse Practitioner

## 2023-04-12 ENCOUNTER — Encounter: Payer: Self-pay | Admitting: Nurse Practitioner

## 2023-04-12 ENCOUNTER — Other Ambulatory Visit: Payer: Self-pay

## 2023-04-12 ENCOUNTER — Other Ambulatory Visit
Admission: RE | Admit: 2023-04-12 | Discharge: 2023-04-12 | Disposition: A | Discharge status: Home / Self Care | Payer: Commercial Managed Care - PPO | Source: Ambulatory Visit | Attending: Nurse Practitioner | Admitting: Nurse Practitioner

## 2023-04-12 ENCOUNTER — Telehealth: Payer: Self-pay

## 2023-04-12 DIAGNOSIS — A029 Salmonella infection, unspecified: Secondary | ICD-10-CM

## 2023-04-12 DIAGNOSIS — R197 Diarrhea, unspecified: Secondary | ICD-10-CM | POA: Insufficient documentation

## 2023-04-12 LAB — GASTROINTESTINAL PANEL BY PCR, STOOL (REPLACES STOOL CULTURE)

## 2023-04-12 MED ORDER — LEVOFLOXACIN 500 MG PO TABS
500.0000 mg | ORAL_TABLET | Freq: Every day | ORAL | 0 refills | Status: AC
  Filled 2023-04-12: qty 5, 5d supply, fill #0

## 2023-04-12 MED ORDER — FLUCONAZOLE 150 MG PO TABS
150.0000 mg | ORAL_TABLET | Freq: Every day | ORAL | 0 refills | Status: DC
  Filled 2023-04-12: qty 1, 1d supply, fill #0

## 2023-04-12 NOTE — Telephone Encounter (Signed)
I already spoke to the Patient and apoligized regarding this .

## 2023-04-12 NOTE — Addendum Note (Signed)
Addended by: Prince Solian A on: 04/12/2023 08:43 AM   Modules accepted: Orders

## 2023-04-12 NOTE — Progress Notes (Signed)
GI Panel positive for Salmonella. Will treat with Levaquin 500 mg for 5 days.

## 2023-04-12 NOTE — Progress Notes (Signed)
Please call and inform pt the stool is positive for salmonella infection and will treat with Levaquin for 5 days. Please advise pt to take probiotics and continue 1-2 weeks after finishing the antibiotic.

## 2023-04-12 NOTE — Addendum Note (Signed)
Addended by: Lonell Face C on: 04/12/2023 07:45 AM   Modules accepted: Orders

## 2023-04-12 NOTE — Progress Notes (Signed)
Please inform pt diflucan has been sent.

## 2023-04-12 NOTE — Telephone Encounter (Unsigned)
Copied from CRM [id number]. Topic: Clinical - Medication Question >> Apr 12, 2023  2:59 PM Isabell A wrote: Reason for CRM: Patient seen yesterday and requested Diflucan to prevent her from getting yeast infection - prescription not at pharmacy.

## 2023-04-12 NOTE — Telephone Encounter (Signed)
 error

## 2023-04-15 ENCOUNTER — Other Ambulatory Visit
Admission: RE | Admit: 2023-04-15 | Discharge: 2023-04-15 | Disposition: A | Discharge status: Home / Self Care | Payer: Commercial Managed Care - PPO | Attending: Nurse Practitioner | Admitting: Nurse Practitioner

## 2023-04-15 DIAGNOSIS — E876 Hypokalemia: Secondary | ICD-10-CM | POA: Diagnosis not present

## 2023-04-15 LAB — C DIFFICILE QUICK SCREEN W PCR REFLEX
C Diff antigen: NEGATIVE
C Diff interpretation: NOT DETECTED
C Diff toxin: NEGATIVE

## 2023-04-15 LAB — POTASSIUM: Potassium: 3.3 mmol/L — ABNORMAL LOW (ref 3.5–5.1)

## 2023-04-16 ENCOUNTER — Other Ambulatory Visit: Payer: Self-pay | Admitting: Family Medicine

## 2023-04-16 ENCOUNTER — Other Ambulatory Visit: Payer: Self-pay

## 2023-04-16 ENCOUNTER — Encounter: Payer: Self-pay | Admitting: Nurse Practitioner

## 2023-04-16 DIAGNOSIS — E785 Hyperlipidemia, unspecified: Secondary | ICD-10-CM

## 2023-04-16 MED FILL — Pravastatin Sodium Tab 40 MG: ORAL | 90 days supply | Qty: 90 | Fill #0 | Status: AC

## 2023-04-17 ENCOUNTER — Other Ambulatory Visit: Payer: Self-pay | Admitting: Nurse Practitioner

## 2023-04-17 DIAGNOSIS — E876 Hypokalemia: Secondary | ICD-10-CM

## 2023-04-18 ENCOUNTER — Other Ambulatory Visit: Payer: Self-pay

## 2023-04-22 ENCOUNTER — Other Ambulatory Visit
Admission: RE | Admit: 2023-04-22 | Discharge: 2023-04-22 | Disposition: A | Discharge status: Home / Self Care | Payer: Commercial Managed Care - PPO | Attending: Nurse Practitioner | Admitting: Nurse Practitioner

## 2023-04-22 DIAGNOSIS — E876 Hypokalemia: Secondary | ICD-10-CM | POA: Insufficient documentation

## 2023-04-22 LAB — POTASSIUM: Potassium: 3.9 mmol/L (ref 3.5–5.1)

## 2023-04-29 ENCOUNTER — Telehealth: Payer: Self-pay

## 2023-04-29 ENCOUNTER — Other Ambulatory Visit: Payer: Self-pay

## 2023-04-29 ENCOUNTER — Encounter: Payer: Self-pay | Admitting: Family Medicine

## 2023-04-29 ENCOUNTER — Ambulatory Visit: Payer: Commercial Managed Care - PPO | Admitting: Family Medicine

## 2023-04-29 VITALS — BP 118/70 | HR 81 | Temp 98.0°F | Resp 18 | Ht 66.0 in | Wt 177.2 lb

## 2023-04-29 DIAGNOSIS — I10 Essential (primary) hypertension: Secondary | ICD-10-CM | POA: Diagnosis not present

## 2023-04-29 DIAGNOSIS — E559 Vitamin D deficiency, unspecified: Secondary | ICD-10-CM

## 2023-04-29 DIAGNOSIS — E785 Hyperlipidemia, unspecified: Secondary | ICD-10-CM

## 2023-04-29 DIAGNOSIS — E538 Deficiency of other specified B group vitamins: Secondary | ICD-10-CM

## 2023-04-29 DIAGNOSIS — E876 Hypokalemia: Secondary | ICD-10-CM

## 2023-04-29 DIAGNOSIS — J309 Allergic rhinitis, unspecified: Secondary | ICD-10-CM

## 2023-04-29 MED ORDER — AMLODIPINE BESYLATE 5 MG PO TABS
5.0000 mg | ORAL_TABLET | Freq: Every day | ORAL | 3 refills | Status: DC
  Filled 2023-04-29 – 2023-07-17 (×2): qty 90, 90d supply, fill #0
  Filled 2023-10-15: qty 90, 90d supply, fill #1
  Filled 2024-01-14: qty 90, 90d supply, fill #2

## 2023-04-29 MED ORDER — PRAVASTATIN SODIUM 40 MG PO TABS
40.0000 mg | ORAL_TABLET | Freq: Every day | ORAL | 3 refills | Status: DC
  Filled 2023-04-29 – 2023-07-17 (×2): qty 90, 90d supply, fill #0
  Filled 2023-10-14: qty 90, 90d supply, fill #1
  Filled 2024-01-10: qty 90, 90d supply, fill #2

## 2023-04-29 NOTE — Assessment & Plan Note (Signed)
On Pravachol. -Continue Pravachol.

## 2023-04-29 NOTE — Progress Notes (Signed)
SUBJECTIVE:   Chief Complaint  Patient presents with   Establish Care    Transferring From [place]   HPI Presents to clinic to transfer care  Discussed the use of AI scribe software for clinical note transcription with the patient, who gave verbal consent to proceed.  History of Present Illness The patient presents for a medication review and management of hypertension and allergies. They are transferring care from Dr. Birdie Sons.  The patient has been managing hypertension with amlodipine 5 mg at night, switched from HCTZ due to dizziness and elevated blood pressure. Since the switch, they no longer experience dizziness, and their blood pressure is typically around 120/80 mmHg during doctor visits. They do not regularly monitor their blood pressure at home.  They experience severe allergies, particularly from March onwards, and take Xyzal every evening. They have been prescribed Singulair during this period due to worsening symptoms. They have not seen an allergy specialist and have no history of asthma[place]  They have a history of low potassium levels during illnesses such as salmonella food poisoning and a kidney stone six to seven years ago. Recently, they were prescribed Chlorcon for two days to address low potassium levels during an illness with diarrhea lasting two weeks, after which their potassium levels normalized.  They are on Loestrin for birth control, reporting very light periods or occasional spotting since starting the medication. They take the medication regularly, including the seven-day break, but do not experience a full period. They have no history of blood clots and are monitored by their gynecologist.  They take Pravachol daily for hyperlipidemia and vitamin D supplements. They had a mammogram in July 2024 and a colonoscopy in January 2024, with the next one due in ten years. No issues with shortness of  breath, chest pain, or abdominal pain. 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        PERTINENT PMH / PSH: As above  OBJECTIVE:  BP 118/70   Pulse 81   Temp 98 F (36.7 C)   Resp 18   Ht 5\' 6"  (1.676 m)   Wt 177 lb 4 oz (80.4 kg)   SpO2 97%   BMI 28.61 kg/m    Physical Exam Vitals reviewed.  Constitutional:      General: She is not in acute distress.    Appearance: She is not ill-appearing.  HENT:     Head: Normocephalic.     Right Ear: Tympanic membrane, ear canal and external ear normal.     Left Ear: Tympanic membrane, ear canal and external ear normal.     Nose: Nose normal.     Mouth/Throat:     Mouth: Mucous membranes are moist.  Eyes:     Extraocular Movements: Extraocular movements intact.     Conjunctiva/sclera: Conjunctivae normal.     Pupils: Pupils are equal, round, and reactive to light.  Neck:     Thyroid: No thyromegaly or thyroid tenderness.     Vascular: No carotid bruit.  Cardiovascular:     Rate and Rhythm: Normal rate and regular rhythm.     Pulses: Normal pulses.     Heart sounds: Normal heart sounds.  Pulmonary:     Effort: Pulmonary effort is normal.     Breath sounds: Normal breath sounds.  Abdominal:     General: Bowel sounds are normal. There is no distension.  Palpations: Abdomen is soft.     Tenderness: There is no abdominal tenderness. There is no right CVA tenderness, left CVA tenderness, guarding or rebound.  Musculoskeletal:        General: Normal range of motion.     Cervical back: Normal range of motion.     Right lower leg: No edema.     Left lower leg: No edema.  Lymphadenopathy:     Cervical: No cervical adenopathy.  Skin:    Capillary Refill: Capillary refill takes less than 2 seconds.  Neurological:     General: No focal deficit present.     Mental Status: She is alert and oriented to person, place, and time. Mental status is at baseline.     Motor: No  weakness.  Psychiatric:        Mood and Affect: Mood normal.        Behavior: Behavior normal.        Thought Content: Thought content normal.        Judgment: Judgment normal.           04/29/2023    9:53 AM 04/11/2023    9:12 AM 02/26/2023    8:35 AM 08/21/2022    8:30 AM 02/09/2022    9:54 AM  Depression screen PHQ 2/9  Decreased Interest 0 0 0 0 0  Down, Depressed, Hopeless 0 0 0 0 0  PHQ - 2 Score 0 0 0 0 0  Altered sleeping 0 0 0 0   Tired, decreased energy 0 0 0 0   Change in appetite 0 0 0 0   Feeling bad or failure about yourself  0 0 0 0   Trouble concentrating 0 0 0 0   Moving slowly or fidgety/restless 0 0 0 0   Suicidal thoughts 0 0 0 0   PHQ-9 Score 0 0 0 0   Difficult doing work/chores Not difficult at all Not difficult at all Not difficult at all Not difficult at all       04/29/2023    9:53 AM 04/11/2023    9:12 AM 02/26/2023    8:35 AM 08/21/2022    8:31 AM  GAD 7 : Generalized Anxiety Score  Nervous, Anxious, on Edge 0 0 0 0  Control/stop worrying 0 0 0 0  Worry too much - different things 0 0 0 0  Trouble relaxing 0 0 0 0  Restless 0 0 0 0  Easily annoyed or irritable 0 0 0 0  Afraid - awful might happen 0 0 0 0  Total GAD 7 Score 0 0 0 0  Anxiety Difficulty Not difficult at all Not difficult at all Not difficult at all Not difficult at all    ASSESSMENT/PLAN:  Primary hypertension Assessment & Plan: Well controlled on Amlodipine 5mg  at night. No recent episodes of dizziness or high blood pressure readings. -Continue Amlodipine 5mg  at night.  Orders: -     amLODIPine Besylate; Take 1 tablet (5 mg total) by mouth daily.  Dispense: 90 tablet; Refill: 3 -     Comprehensive metabolic panel; Future  Hyperlipidemia, unspecified hyperlipidemia type Assessment & Plan: On Pravachol. -Continue Pravachol.  Orders: -     Pravastatin Sodium; Take 1 tablet (40 mg total) by mouth daily.  Dispense: 90 tablet; Refill: 3 -     Lipid panel; Future  Vitamin  D deficiency Assessment & Plan: -Continue Vitamin D supplementation.  Orders: -     VITAMIN D 25 Hydroxy (Vit-D  Deficiency, Fractures); Future  Vitamin B 12 deficiency -     Vitamin B12  Hypokalemia Assessment & Plan: History of low potassium levels during illness and while on HCTZ. Recent episode of low potassium resolved with Chlorcon supplementation. -No current intervention needed. Monitor potassium levels during illness or if symptoms of hypokalemia occur.   Allergic rhinitis, unspecified seasonality, unspecified trigger Assessment & Plan: On Xyzal and Singulair for seasonal allergies. -Continue Xyzal and Singulair.     General Health Maintenance -Continue regular follow-up with OB/GYN, including Pap smears every 5 years.  PDMP reviewed  Return in about 4 months (around 08/23/2023) for LAB, PCP.  Dana Allan, MD

## 2023-04-29 NOTE — Assessment & Plan Note (Signed)
History of low potassium levels during illness and while on HCTZ. Recent episode of low potassium resolved with Chlorcon supplementation. -No current intervention needed. Monitor potassium levels during illness or if symptoms of hypokalemia occur.

## 2023-04-29 NOTE — Patient Instructions (Addendum)
It was a pleasure meeting you today. Thank you for allowing me to take part in your health care.  Our goals for today as we discussed include:  Refills sent for requested medications  Schedule lab appointment before annual visit.  Fast for 10 hours   Follow up as scheduled  This is a list of the screening recommended for you and due dates:  Health Maintenance  Topic Date Due   COVID-19 Vaccine (3 - 2024-25 season) 11/25/2022   Pap with HPV screening  04/25/2025   DTaP/Tdap/Td vaccine (2 - Tdap) 11/23/2026   Colon Cancer Screening  07/12/2032   Flu Shot  Completed   Hepatitis C Screening  Completed   HIV Screening  Addressed   HPV Vaccine  Aged Out      If you have any questions or concerns, please do not hesitate to call the office at [phone].  I look forward to our next visit and until then take care and stay safe.  Regards,   Dana Allan, MD   [facility]

## 2023-04-29 NOTE — Addendum Note (Signed)
Addended by: Enid Cutter on: 04/29/2023 10:41 AM   Modules accepted: Orders

## 2023-04-29 NOTE — Assessment & Plan Note (Signed)
On Xyzal and Singulair for seasonal allergies. -Continue Xyzal and Singulair.

## 2023-04-29 NOTE — Assessment & Plan Note (Signed)
 Continue Vitamin D supplementation

## 2023-04-29 NOTE — Telephone Encounter (Signed)
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  Please change lab order accordingly.

## 2023-04-29 NOTE — Telephone Encounter (Signed)
Called pt and advise her that lab orders were changed to the hospital, and she could get them there.

## 2023-04-29 NOTE — Assessment & Plan Note (Signed)
Well controlled on Amlodipine 5mg  at night. No recent episodes of dizziness or high blood pressure readings. -Continue Amlodipine 5mg  at night.

## 2023-04-30 ENCOUNTER — Ambulatory Visit: Payer: 59 | Admitting: Obstetrics and Gynecology

## 2023-05-06 MED FILL — Montelukast Sodium Tab 10 MG (Base Equiv): ORAL | 30 days supply | Qty: 30 | Fill #2 | Status: AC

## 2023-05-07 LAB — MISCELLANEOUS TEST

## 2023-05-13 ENCOUNTER — Encounter: Payer: Self-pay | Admitting: Physical Therapy

## 2023-05-13 ENCOUNTER — Ambulatory Visit: Payer: Commercial Managed Care - PPO | Attending: Obstetrics and Gynecology | Admitting: Physical Therapy

## 2023-05-13 ENCOUNTER — Other Ambulatory Visit: Payer: Self-pay

## 2023-05-13 DIAGNOSIS — R278 Other lack of coordination: Secondary | ICD-10-CM | POA: Insufficient documentation

## 2023-05-13 DIAGNOSIS — M6281 Muscle weakness (generalized): Secondary | ICD-10-CM | POA: Diagnosis not present

## 2023-05-13 NOTE — Therapy (Signed)
OUTPATIENT PHYSICAL THERAPY FEMALE PELVIC EVALUATION   Patient Name: HAWLEY PAVIA MRN: [id number] DOB:[birth year], 48 y.o., female Today's Date: 05/13/2023  END OF SESSION:  PT End of Session - 05/13/23 1635     Visit Number 1    Number of Visits 8    Date for PT Re-Evaluation 06/10/23    Authorization Type [facility] [employer]    PT Start Time 1530    PT Stop Time 1615    PT Time Calculation (min) 45 min    Activity Tolerance Patient tolerated treatment well    Behavior During Therapy [facility] for tasks assessed/performed             Past Medical History:  Diagnosis Date   History of hypertension 01/20/2016   HLD (hyperlipidemia)    HTN (hypertension)    Migraine    Ureteral stone    Vaginal Pap smear, abnormal    Remote history of 1 abnormal pap   Past Surgical History:  Procedure Laterality Date   BLADDER SUSPENSION N/A 04/05/2022   Procedure: TRANSVAGINAL TAPE (TVT) PROCEDURE;  Surgeon: Marguerita Beards, MD;  Location: [facility];  Service: Gynecology;  Laterality: N/A;  total time requested is 1 hour   COLONOSCOPY WITH PROPOFOL N/A 07/13/2022   Procedure: COLONOSCOPY WITH PROPOFOL;  Surgeon: Midge Minium, MD;  Location: [facility];  Service: Endoscopy;  Laterality: N/A;   CYSTOSCOPY N/A 04/05/2022   Procedure: CYSTOSCOPY;  Surgeon: Marguerita Beards, MD;  Location: [facility];  Service: Gynecology;  Laterality: N/A;   TONSILLECTOMY  2011   Patient Active Problem List   Diagnosis Date Noted   Diarrhea 04/11/2023   Encounter for general adult medical examination with abnormal findings 08/21/2022   Finger pain, left 08/21/2022   Colon cancer screening 07/13/2022   Complete tear of ulnar collateral ligament of metacarpophalangeal (MCP) joint of finger 02/09/2022   Heartburn 02/01/2021   Right ankle sprain 02/01/2021   Joint pain 08/01/2020   Need for hepatitis C screening test 06/04/2020   Neck pain 12/15/2019    Overweight 07/20/2019   Hypertension 03/11/2019   Vitamin D deficiency 03/11/2019   Allergic rhinitis 07/30/2018   Left foot pain 03/04/2018   Skin exam, screening for cancer 03/04/2018   Hypokalemia 01/25/2017   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   Prediabetes 01/20/2016   Hyperlipidemia 01/20/2016    PCP: Dana Allan, MD  REFERRING PROVIDER: Marguerita Beards, MD  REFERRING DIAG: N39.3 (ICD-10-CM) - SUI (stress urinary incontinence, female) 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 She had bladder sling surgery in January of 2024 that helped this issue in majority, although, she will still leak after voiding (post-void dribbling). Patient reports that she has had success with pelvic PT in the past.  Fluid intake: carries around a water bottle but rarely drinks 1 full bottle per day   PAIN:  Are you having pain? No NPRS scale: 0/10  PRECAUTIONS: None  RED FLAGS: None   WEIGHT BEARING RESTRICTIONS: No  FALLS:  Has patient fallen in last 6 months? 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   ACTIVITY LEVEL : walks on treadmill, plays softball and kickball recreation league   PLOF: Independent  PATIENT GOALS: decrease leakage, get a stronger pelvic floor   PERTINENT HISTORY:  Surgery: s/p Midurethral sling ([employer]vantage Fit), cystoscopy on 04/05/22 Sexual abuse: No  BOWEL MOVEMENT: Pain with bowel movement: No Type of bowel movement:Type (Bristol Stool Scale) 4, Frequency  0-1x/day, Strain no, and Splinting no Fully empty rectum: Yes:   Leakage: No Pads: No Fiber supplement/laxative No; takes a probiotic   URINATION: Pain with urination: No Fully empty bladder: Nofeels like yes Stream: Weak Urgency: Yes  Frequency: every 2 hours usually  Leakage: Bending forward Pads: Yes: wears a light liner   INTERCOURSE:  Ability to have vaginal penetration No  Pain with intercourse: none DrynessNo  PREGNANCY: Vaginal deliveries 1 Tearing Yes: grade 3 Episiotomy No  PROLAPSE: None   OBJECTIVE:  Note: Objective measures were completed at Evaluation unless otherwise noted.  DIAGNOSTIC FINDINGS:  Emptying was dysfunctional with a elevated PVR ( ), a sustained detrusor contraction present, abdominal straining not present, dyssynergic urethral sphincter activity on EMG  COGNITION: Overall cognitive status: Within functional limits for tasks assessed     SENSATION: Light touch: Appears intact  LUMBAR SPECIAL TESTS:  Single leg stance test: Positive  FUNCTIONAL TESTS:  Squat: mild dynamic knee valgus with squat test    GAIT: Comments: mild trendelenburg gait pattern with amb   POSTURE: rounded shoulders and forward head  LUMBARAROM/PROM:  A/PROM A/PROM  eval  Flexion 25% limited  Extension 25% limited  Right lateral flexion 25% limited  Left lateral flexion 25% limited  Right rotation 25% limited  Left rotation 25% limited   (Blank rows = not tested)  LOWER EXTREMITY ROM: WNL  Active ROM Right eval Left eval  Hip flexion    Hip extension    Hip abduction    Hip adduction    Hip internal rotation    Hip external rotation    Knee flexion    Knee extension    Ankle dorsiflexion    Ankle plantarflexion    Ankle inversion    Ankle eversion     (Blank rows = not tested)  LOWER EXTREMITY MMT: 4/5 bilateral hips grossly, 4+/5 bilateral knees grossly   MMT Right eval Left eval  Hip flexion    Hip extension    Hip  abduction    Hip adduction    Hip internal rotation    Hip external rotation    Knee flexion    Knee extension    Ankle dorsiflexion    Ankle plantarflexion    Ankle inversion    Ankle eversion     (Blank rows = not tested) PALPATION:   General: no TTP with palpation of adductors/hip flexors   Pelvic Alignment: WNL  Abdominal: decreased rib mobility with  diaphragmatic breathing, tends to upper chest breathe, abdominal bracing at rest                External Perineal Exam: minimal dryness noted                             Internal Pelvic Floor: generally weak throughout bilateral layers of pelvic floor musculature, no trigger points noted, no pain with palpation  Patient confirms identification and approves PT to assess internal pelvic floor and treatment Yes No emotional/communication barriers or cognitive limitation. Patient is motivated to learn. Patient understands and agrees with treatment goals and plan. PT explains patient will be examined in standing, sitting, and lying down to see how their muscles and joints work. When they are ready, they will be asked to remove their underwear so PT can examine their perineum. The patient is also given the option of providing their own chaperone as one is not provided in our facility. The patient also has the right and is explained the right to defer or refuse any part of the evaluation or treatment including the internal exam. With the patient's consent, PT will use one gloved finger to gently assess the muscles of the pelvic floor, seeing how well it contracts and relaxes and if there is muscle symmetry. After, the patient will get dressed and PT and patient will discuss exam findings and plan of care. PT and patient discuss plan of care, schedule, attendance policy and HEP activities.  PELVIC MMT:   MMT eval  Vaginal 3/5, 4 quick flicks, 3 second hold  Internal Anal Sphincter   External Anal Sphincter   Puborectalis   Diastasis Recti    (Blank rows = not tested)       TONE: Generally decreased throughout   PROLAPSE: N/A  TODAY'S TREATMENT:                                                                                                                              DATE:   EVAL 05/13/23: Examination completed, findings reviewed, pt educated on POC, HEP, and self care. Pt motivated to participate in PT and agreeable to attempt recommendations.  Neuro re-ed: Supine diaphragmatic breathing with pelvic floor AROM (inhale/lengthen, exhale/shorten) 3x10  Self care: Bladder irritants, relative anatomy and pelvic floor active range of motion, connection between diaphragm and pelvic floor   PATIENT EDUCATION:  Education details: Bladder irritants, relative anatomy and pelvic floor active range of motion, connection between diaphragm and pelvic floor  Person educated: Patient Education method: Explanation, Demonstration, Tactile cues, Verbal cues, and Handouts Education comprehension: verbalized understanding, returned demonstration, verbal cues required, and tactile cues required  HOME EXERCISE PROGRAM: Access Code: [email or web address] URL: [email or web address] Date: 05/13/2023 Prepared by: Earna Coder  Exercises - Supine Pelvic Floor Contraction  - 1 x daily - 7 x weekly - 3 sets - 10 reps  ASSESSMENT:  CLINICAL IMPRESSION: Patient is  a 48 y.o. Female who was seen today for physical therapy evaluation and treatment for stress urinary incontinence. These symptoms have improved since bladder sling surgery in January of 2024, but she will still randomly have episodes of incontinence post-void. Patient fully consented to internal examination which revealed generalized weakness and lack of coordination in the pelvic floor musculature. With repetitive internal and verbal cueing, patient was able to coordinate her inhale with lengthening and her exhale with contracting the pelvic floor. No increase in pain or leakage following  today's session. Pt would benefit from additional PT to further address deficits.   OBJECTIVE IMPAIRMENTS: decreased coordination, decreased endurance, decreased mobility, decreased ROM, and decreased strength.   ACTIVITY LIMITATIONS: toileting  PARTICIPATION LIMITATIONS:  none  PERSONAL FACTORS: Past/current experiences and Time since onset of injury/illness/exacerbation are also affecting patient's functional outcome.   REHAB POTENTIAL: Good  CLINICAL DECISION MAKING: Stable/uncomplicated  EVALUATION COMPLEXITY: Low   GOALS: Goals reviewed with patient? Yes  SHORT TERM GOALS: Target date: 06/10/2023  Pt will be independent with HEP.  Baseline: Goal status: INITIAL  2.  Pt will be independent with the knack, urge suppression technique, and double voiding in order to improve bladder habits and decrease urinary incontinence.   Baseline:  Goal status: INITIAL   LONG TERM GOALS: Target date: 11/10/2023  Pt will be independent with advanced HEP.  Baseline:  Goal status: INITIAL  2.  Pt to demonstrate improved coordination of pelvic floor and breathing mechanics with 10# squat with appropriate synergistic patterns to decrease pain and leakage at least 75% of the time.   Baseline:  Goal status: INITIAL  3.  Pt will demonstrate 4/5 pelvic floor muscle strength with appropriate coordination in order to decrease urinary incontinence, urgency and frequency.   Baseline:  Goal status: INITIAL  PLAN:  PT FREQUENCY: 1x/week  PT DURATION: 8 weeks  PLANNED INTERVENTIONS: 97110-Therapeutic exercises, 97530- Therapeutic activity, 97112- Neuromuscular re-education, 97535- Self Care, 16109- Manual therapy, Taping, Dry Needling, Joint mobilization, Spinal mobilization, Scar mobilization, Cryotherapy, and Moist heat  PLAN FOR NEXT SESSION: continued internal treatment if patient doesn't understand, seated pelvic floor AROM, introduce quick flicks, introduce knack technique   Omar Person, PT 05/13/2023, 4:36 PM

## 2023-05-13 NOTE — Patient Instructions (Signed)

## 2023-05-16 ENCOUNTER — Ambulatory Visit: Payer: Commercial Managed Care - PPO | Admitting: Physical Therapy

## 2023-06-11 ENCOUNTER — Ambulatory Visit: Payer: Commercial Managed Care - PPO | Attending: Obstetrics and Gynecology | Admitting: Physical Therapy

## 2023-06-11 DIAGNOSIS — R278 Other lack of coordination: Secondary | ICD-10-CM | POA: Insufficient documentation

## 2023-06-11 DIAGNOSIS — M6281 Muscle weakness (generalized): Secondary | ICD-10-CM | POA: Insufficient documentation

## 2023-06-11 NOTE — Therapy (Signed)
 OUTPATIENT PHYSICAL THERAPY FEMALE PELVIC TREATMENT   Patient Name: Anna Kim MRN: [id number] DOB:1975/07/04, 48 y.o., female Today's Date: 06/11/2023  END OF SESSION:  PT End of Session - 06/11/23 1518     Visit Number 2    Number of Visits 8    Date for PT Re-Evaluation 06/10/23    Authorization Type [facility] [employer]    PT Start Time [phone]    PT Stop Time 0330    PT Time Calculation (min) 45 min    Activity Tolerance Patient tolerated treatment well    Behavior During Therapy [facility] for tasks assessed/performed              Past Medical History:  Diagnosis Date   History of hypertension 01/20/2016   HLD (hyperlipidemia)    HTN (hypertension)    Migraine    Ureteral stone    Vaginal Pap smear, abnormal    Remote history of 1 abnormal pap   Past Surgical History:  Procedure Laterality Date   BLADDER SUSPENSION N/A 04/05/2022   Procedure: TRANSVAGINAL TAPE (TVT) PROCEDURE;  Surgeon: Marguerita Beards, MD;  Location: [facility];  Service: Gynecology;  Laterality: N/A;  total time requested is 1 hour   COLONOSCOPY WITH PROPOFOL N/A 07/13/2022   Procedure: COLONOSCOPY WITH PROPOFOL;  Surgeon: Midge Minium, MD;  Location: [facility];  Service: Endoscopy;  Laterality: N/A;   CYSTOSCOPY N/A 04/05/2022   Procedure: CYSTOSCOPY;  Surgeon: Marguerita Beards, MD;  Location: [facility];  Service: Gynecology;  Laterality: N/A;   TONSILLECTOMY  2011   Patient Active Problem List   Diagnosis Date Noted   Diarrhea 04/11/2023   Encounter for general adult medical examination with abnormal findings 08/21/2022   Finger pain, left 08/21/2022   Colon cancer screening 07/13/2022   Complete tear of ulnar collateral ligament of metacarpophalangeal (MCP) joint of finger 02/09/2022   Heartburn 02/01/2021   Right ankle sprain 02/01/2021   Joint pain 08/01/2020   Need for hepatitis C screening test 06/04/2020   Neck pain 12/15/2019    Overweight 07/20/2019   Hypertension 03/11/2019   Vitamin D deficiency 03/11/2019   Allergic rhinitis 07/30/2018   Left foot pain 03/04/2018   Skin exam, screening for cancer 03/04/2018   Hypokalemia 01/25/2017   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 She is not sure when the leakage is happening. Able to get through a full kickball game with no leakage issues.   Fluid intake: carries around a water bottle but rarely drinks 1 full bottle per day   PAIN:  Are you having pain? No NPRS scale: 0/10  PRECAUTIONS: None  RED FLAGS: None   WEIGHT BEARING RESTRICTIONS: No  FALLS:  Has patient fallen in last 6 months? 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   ACTIVITY LEVEL : walks on treadmill, plays softball and kickball recreation league   PLOF: Independent  PATIENT GOALS: decrease leakage, get a stronger pelvic floor   PERTINENT HISTORY:  Surgery: s/p Midurethral sling ([employer]vantage Fit), cystoscopy on 04/05/22 Sexual abuse: No  BOWEL MOVEMENT: Pain with bowel movement: No Type of bowel movement:Type (Bristol Stool Scale) 4, Frequency 0-1x/day, Strain no, and Splinting no Fully empty rectum: Yes:   Leakage: No Pads: No Fiber supplement/laxative No; takes a probiotic    URINATION: Pain with urination: No Fully empty bladder: Nofeels like yes Stream: Weak Urgency: Yes  Frequency: every 2 hours usually  Leakage: Bending forward Pads: Yes: wears a light liner   INTERCOURSE:  Ability to have vaginal penetration No  Pain with intercourse: none DrynessNo  PREGNANCY: Vaginal deliveries 1 Tearing Yes: grade 3 Episiotomy No  PROLAPSE: None   OBJECTIVE:  Note: Objective measures were completed at Evaluation unless otherwise noted.  DIAGNOSTIC FINDINGS:  Emptying was dysfunctional with a elevated PVR ( ), a sustained detrusor contraction present, abdominal straining not present, dyssynergic urethral sphincter activity on EMG  COGNITION: Overall cognitive status: Within functional limits for tasks assessed     SENSATION: Light touch: Appears intact  LUMBAR SPECIAL TESTS:  Single leg stance test: Positive  FUNCTIONAL TESTS:  Squat: mild dynamic knee valgus with squat test    GAIT: Comments: mild trendelenburg gait pattern with amb   POSTURE: rounded shoulders and forward head  LUMBARAROM/PROM:  A/PROM A/PROM  eval  Flexion 25% limited  Extension 25% limited  Right lateral flexion 25% limited  Left lateral flexion 25% limited  Right rotation 25% limited  Left rotation 25% limited   (Blank rows = not tested)  LOWER EXTREMITY ROM: WNL  Active ROM Right eval Left eval  Hip flexion    Hip extension    Hip abduction    Hip adduction    Hip internal rotation    Hip external rotation    Knee flexion    Knee extension    Ankle dorsiflexion    Ankle plantarflexion    Ankle inversion    Ankle eversion     (Blank rows = not tested)  LOWER EXTREMITY MMT: 4/5 bilateral hips grossly, 4+/5 bilateral knees grossly   MMT Right eval Left eval  Hip flexion    Hip extension    Hip abduction    Hip adduction    Hip internal rotation    Hip external rotation    Knee flexion    Knee extension    Ankle dorsiflexion     Ankle plantarflexion    Ankle inversion    Ankle eversion     (Blank rows = not tested) PALPATION:   General: no TTP with palpation of adductors/hip flexors   Pelvic Alignment: WNL  Abdominal: decreased rib mobility with diaphragmatic breathing, tends to upper chest breathe, abdominal bracing at rest  External Perineal Exam: minimal dryness noted                             Internal Pelvic Floor: generally weak throughout bilateral layers of pelvic floor musculature, no trigger points noted, no pain with palpation  Patient confirms identification and approves PT to assess internal pelvic floor and treatment Yes No emotional/communication barriers or cognitive limitation. Patient is motivated to learn. Patient understands and agrees with treatment goals and plan. PT explains patient will be examined in standing, sitting, and lying down to see how their muscles and joints work. When they are ready, they will be asked to remove their underwear so PT can examine their perineum. The patient is also given the option of providing their own chaperone as one is not provided in our facility. The patient also has the right and is explained the right to defer or refuse any part of the evaluation or treatment including the internal exam. With the patient's consent, PT will use one gloved finger to gently assess the muscles of the pelvic floor, seeing how well it contracts and relaxes and if there is muscle symmetry. After, the patient will get dressed and PT and patient will discuss exam findings and plan of care. PT and patient discuss plan of care, schedule, attendance policy and HEP activities.  PELVIC MMT:   MMT eval  Vaginal 3/5, 4 quick flicks, 3 second hold  Internal Anal Sphincter   External Anal Sphincter   Puborectalis   Diastasis Recti   (Blank rows = not tested)       TONE: Generally decreased throughout   PROLAPSE: N/A  TODAY'S TREATMENT:                                                                                                                               DATE:   EVAL 05/13/23: Examination completed, findings reviewed, pt educated on POC, HEP, and self care. Pt motivated to participate in PT and agreeable to attempt recommendations.  Neuro re-ed: Supine diaphragmatic breathing with pelvic floor AROM (inhale/lengthen, exhale/shorten) 3x10  Self care: Bladder irritants, relative anatomy and pelvic floor active range of motion, connection between diaphragm and pelvic floor   06/11/23: Neuro re-ed/TherEx seated diaphragmatic breathing with pelvic floor AROM (inhale/lengthen, exhale/shorten) 3x10  Seated pelvic floor quick flicks + diaphragmatic breathing 2x10  Sit to stand with adductor ball squeeze + diaphragmatic breathing 2x12  Bridge + adductor ball squeeze + diaphragmatic breathing 2x12  Sidelying clamshell + reverse clamshell + diaphragmatic breathing 2x12  Reclined hip IR/ER for mobility + diaphragmatic breathing 2x100min  Pretzel piriformis stretch + diaphragmatic breathing 2x39min  Self care: Bladder irritants, relative anatomy and pelvic floor active range of motion, connection between diaphragm and pelvic floor   PATIENT EDUCATION:  Education details: Bladder irritants, relative anatomy and pelvic floor active range of motion, connection between diaphragm and pelvic floor  Person  educated: Patient Education method: Explanation, Demonstration, Tactile cues, Verbal cues, and Handouts Education comprehension: verbalized understanding, returned demonstration, verbal cues required, and tactile cues required  HOME EXERCISE PROGRAM: Access Code: [email or web address] URL: [email or web address] Date: 06/11/2023 Prepared by: Earna Coder  Exercises - Seated Pelvic Floor Contraction  - 1 x daily - 7 x weekly - 2 sets - 10 reps - Seated Quick Flick Pelvic Floor Contractions  - 1 x daily - 7 x weekly - 2 sets - 10 reps - Sit to Stand with Newman Pies  Between Knees  - 1 x daily - 7 x weekly - 2 sets - 10 reps - Supine Bridge with Mini Swiss Ball Between Knees  - 1 x daily - 7 x weekly - 2 sets - 10 reps - Clamshell  - 1 x daily - 7 x weekly - 2 sets - 12 reps - Sidelying Reverse Clamshell  - 1 x daily - 7 x weekly - 2 sets - 12 reps - Supine Hip Internal and External Rotation  - 1 x daily - 7 x weekly - 2 sets - 10 reps  ASSESSMENT:  CLINICAL IMPRESSION: Patient is a 48 y.o. Female who was seen today for physical therapy treatment for stress urinary incontinence. No episodes of stress or urge incontinence in the past week. Patient tolerated a progression in all exercises accordingly today, with minimal cueing required from PT for breath coordination. Patient found seated pelvic floor AROM to be more challenging than in supine. No increase in pain or leakage following today's session. Pt would benefit from additional PT to further address deficits.   OBJECTIVE IMPAIRMENTS: decreased coordination, decreased endurance, decreased mobility, decreased ROM, and decreased strength.   ACTIVITY LIMITATIONS: toileting  PARTICIPATION LIMITATIONS:  none  PERSONAL FACTORS: Past/current experiences and Time since onset of injury/illness/exacerbation are also affecting patient's functional outcome.   REHAB POTENTIAL: Good  CLINICAL DECISION MAKING: Stable/uncomplicated  EVALUATION COMPLEXITY: Low   GOALS: Goals reviewed with patient? Yes  SHORT TERM GOALS: Target date: 06/10/2023  Pt will be independent with HEP.  Baseline: Goal status: INITIAL  2.  Pt will be independent with the knack, urge suppression technique, and double voiding in order to improve bladder habits and decrease urinary incontinence.   Baseline:  Goal status: INITIAL   LONG TERM GOALS: Target date: 11/10/2023  Pt will be independent with advanced HEP.  Baseline:  Goal status: INITIAL  2.  Pt to demonstrate improved coordination of pelvic floor and breathing mechanics  with 10# squat with appropriate synergistic patterns to decrease pain and leakage at least 75% of the time.   Baseline:  Goal status: INITIAL  3.  Pt will demonstrate 4/5 pelvic floor muscle strength with appropriate coordination in order to decrease urinary incontinence, urgency and frequency.   Baseline:  Goal status: INITIAL  PLAN:  PT FREQUENCY: 1x/week  PT DURATION: 8 weeks  PLANNED INTERVENTIONS: 97110-Therapeutic exercises, 97530- Therapeutic activity, 97112- Neuromuscular re-education, 97535- Self Care, 91478- Manual therapy, Taping, Dry Needling, Joint mobilization, Spinal mobilization, Scar mobilization, Cryotherapy, and Moist heat  PLAN FOR NEXT SESSION: introduce knack technique, introduce progressive lumbopelvic strength training exercises and mobility stretches   Omar Person, PT 06/11/2023, 3:26 PM

## 2023-06-13 DIAGNOSIS — L700 Acne vulgaris: Secondary | ICD-10-CM | POA: Diagnosis not present

## 2023-06-13 DIAGNOSIS — L821 Other seborrheic keratosis: Secondary | ICD-10-CM | POA: Diagnosis not present

## 2023-06-13 DIAGNOSIS — L578 Other skin changes due to chronic exposure to nonionizing radiation: Secondary | ICD-10-CM | POA: Diagnosis not present

## 2023-06-13 DIAGNOSIS — Z86018 Personal history of other benign neoplasm: Secondary | ICD-10-CM | POA: Diagnosis not present

## 2023-06-14 ENCOUNTER — Ambulatory Visit: Payer: 59 | Admitting: Obstetrics and Gynecology

## 2023-06-18 ENCOUNTER — Ambulatory Visit: Payer: Commercial Managed Care - PPO | Admitting: Physical Therapy

## 2023-06-28 ENCOUNTER — Ambulatory Visit: Payer: Commercial Managed Care - PPO | Attending: Obstetrics and Gynecology | Admitting: Physical Therapy

## 2023-06-28 DIAGNOSIS — M6281 Muscle weakness (generalized): Secondary | ICD-10-CM | POA: Diagnosis not present

## 2023-06-28 DIAGNOSIS — R278 Other lack of coordination: Secondary | ICD-10-CM | POA: Diagnosis not present

## 2023-06-28 NOTE — Therapy (Signed)
 OUTPATIENT PHYSICAL THERAPY FEMALE PELVIC TREATMENT   Patient Name: Anna Kim MRN: [id number] DOB:Dec 13, 1975, 48 y.o., female Today's Date: 06/28/2023  END OF SESSION:  PT End of Session - 06/28/23 0912     Visit Number 3    Number of Visits 8    Date for PT Re-Evaluation 06/10/23    Authorization Type [facility] [employer]    PT Start Time 0845    PT Stop Time 0915    PT Time Calculation (min) 30 min    Activity Tolerance Patient tolerated treatment well    Behavior During Therapy [facility] for tasks assessed/performed               Past Medical History:  Diagnosis Date   History of hypertension 01/20/2016   HLD (hyperlipidemia)    HTN (hypertension)    Migraine    Ureteral stone    Vaginal Pap smear, abnormal    Remote history of 1 abnormal pap   Past Surgical History:  Procedure Laterality Date   BLADDER SUSPENSION N/A 04/05/2022   Procedure: TRANSVAGINAL TAPE (TVT) PROCEDURE;  Surgeon: Marguerita Beards, MD;  Location: [facility];  Service: Gynecology;  Laterality: N/A;  total time requested is 1 hour   COLONOSCOPY WITH PROPOFOL N/A 07/13/2022   Procedure: COLONOSCOPY WITH PROPOFOL;  Surgeon: Midge Minium, MD;  Location: [facility];  Service: Endoscopy;  Laterality: N/A;   CYSTOSCOPY N/A 04/05/2022   Procedure: CYSTOSCOPY;  Surgeon: Marguerita Beards, MD;  Location: [facility];  Service: Gynecology;  Laterality: N/A;   TONSILLECTOMY  2011   Patient Active Problem List   Diagnosis Date Noted   Diarrhea 04/11/2023   Encounter for general adult medical examination with abnormal findings 08/21/2022   Finger pain, left 08/21/2022   Colon cancer screening 07/13/2022   Complete tear of ulnar collateral ligament of metacarpophalangeal (MCP) joint of finger 02/09/2022   Heartburn 02/01/2021   Right ankle sprain 02/01/2021   Joint pain 08/01/2020   Need for hepatitis C screening test 06/04/2020   Neck pain 12/15/2019    Overweight 07/20/2019   Hypertension 03/11/2019   Vitamin D deficiency 03/11/2019   Allergic rhinitis 07/30/2018   Left foot pain 03/04/2018   Skin exam, screening for cancer 03/04/2018   Hypokalemia 01/25/2017   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 She is having a hard time pinpointing when leakage happens. She played kickball last night with no issues.   Fluid intake: carries around a water bottle but rarely drinks 1 full bottle per day   PAIN:  Are you having pain? No NPRS scale: 0/10  PRECAUTIONS: None  RED FLAGS: None   WEIGHT BEARING RESTRICTIONS: No  FALLS:  Has patient fallen in last 6 months? 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   ACTIVITY LEVEL : walks on treadmill, plays softball and kickball recreation league   PLOF: Independent  PATIENT GOALS: decrease leakage, get a stronger pelvic floor   PERTINENT HISTORY:  Surgery: s/p Midurethral sling ([employer]), cystoscopy on 04/05/22 Sexual abuse: No  BOWEL MOVEMENT: Pain with bowel movement: No Type of bowel movement:Type (Bristol Stool Scale) 4, Frequency 0-1x/day, Strain no, and Splinting no Fully empty rectum: Yes:   Leakage: No Pads: No Fiber supplement/laxative No; takes a  probiotic   URINATION: Pain with urination: No Fully empty bladder: Nofeels like yes Stream: Weak Urgency: Yes  Frequency: every 2 hours usually  Leakage: Bending forward Pads: Yes: wears a light liner   INTERCOURSE:  Ability to have vaginal penetration No  Pain with intercourse: none DrynessNo  PREGNANCY: Vaginal deliveries 1 Tearing Yes: grade 3 Episiotomy No  PROLAPSE: None   OBJECTIVE:  Note: Objective measures were completed at Evaluation unless otherwise noted.  DIAGNOSTIC FINDINGS:  Emptying was dysfunctional with a elevated PVR ( ), a sustained detrusor contraction present, abdominal straining not present, dyssynergic urethral sphincter activity on EMG  COGNITION: Overall cognitive status: Within functional limits for tasks assessed     SENSATION: Light touch: Appears intact  LUMBAR SPECIAL TESTS:  Single leg stance test: Positive  FUNCTIONAL TESTS:  Squat: mild dynamic knee valgus with squat test    GAIT: Comments: mild trendelenburg gait pattern with amb   POSTURE: rounded shoulders and forward head  LUMBARAROM/PROM:  A/PROM A/PROM  eval  Flexion 25% limited  Extension 25% limited  Right lateral flexion 25% limited  Left lateral flexion 25% limited  Right rotation 25% limited  Left rotation 25% limited   (Blank rows = not tested)  LOWER EXTREMITY ROM: WNL  Active ROM Right eval Left eval  Hip flexion    Hip extension    Hip abduction    Hip adduction    Hip internal rotation    Hip external rotation    Knee flexion    Knee extension    Ankle dorsiflexion    Ankle plantarflexion    Ankle inversion    Ankle eversion     (Blank rows = not tested)  LOWER EXTREMITY MMT: 4/5 bilateral hips grossly, 4+/5 bilateral knees grossly   MMT Right eval Left eval  Hip flexion    Hip extension    Hip abduction    Hip adduction    Hip internal rotation    Hip external rotation    Knee flexion    Knee extension    Ankle  dorsiflexion    Ankle plantarflexion    Ankle inversion    Ankle eversion     (Blank rows = not tested) PALPATION:   General: no TTP with palpation of adductors/hip flexors   Pelvic Alignment: WNL  Abdominal: decreased rib mobility with diaphragmatic breathing, tends to upper chest breathe, abdominal bracing at rest  External Perineal Exam: minimal dryness noted                             Internal Pelvic Floor: generally weak throughout bilateral layers of pelvic floor musculature, no trigger points noted, no pain with palpation  Patient confirms identification and approves PT to assess internal pelvic floor and treatment Yes No emotional/communication barriers or cognitive limitation. Patient is motivated to learn. Patient understands and agrees with treatment goals and plan. PT explains patient will be examined in standing, sitting, and lying down to see how their muscles and joints work. When they are ready, they will be asked to remove their underwear so PT can examine their perineum. The patient is also given the option of providing their own chaperone as one is not provided in our facility. The patient also has the right and is explained the right to defer or refuse any part of the evaluation or treatment including the internal exam. With the patient's consent, PT will use one gloved finger to gently assess the muscles of the pelvic floor, seeing how well it contracts and relaxes and if there is muscle symmetry. After, the patient will get dressed and PT and patient will discuss exam findings and plan of care. PT and patient discuss plan of care, schedule, attendance policy and HEP activities.  PELVIC MMT:   MMT eval  Vaginal 3/5, 4 quick flicks, 3 second hold  Internal Anal Sphincter   External Anal Sphincter   Puborectalis   Diastasis Recti   (Blank rows = not tested)       TONE: Generally decreased throughout   PROLAPSE: N/A  TODAY'S TREATMENT:                                                                                                                               DATE:   EVAL 05/13/23: Examination completed, findings reviewed, pt educated on POC, HEP, and self care. Pt motivated to participate in PT and agreeable to attempt recommendations.  Neuro re-ed: Supine diaphragmatic breathing with pelvic floor AROM (inhale/lengthen, exhale/shorten) 3x10  Self care: Bladder irritants, relative anatomy and pelvic floor active range of motion, connection between diaphragm and pelvic floor   06/11/23: Neuro re-ed/TherEx seated diaphragmatic breathing with pelvic floor AROM (inhale/lengthen, exhale/shorten) 3x10  Seated pelvic floor quick flicks + diaphragmatic breathing 2x10  Sit to stand with adductor ball squeeze + diaphragmatic breathing 2x12  Bridge + adductor ball squeeze + diaphragmatic breathing 2x12  Sidelying clamshell + reverse clamshell + diaphragmatic breathing 2x12  Reclined hip IR/ER for mobility + diaphragmatic breathing 2x79min  Pretzel piriformis stretch + diaphragmatic breathing 2x79min  Self care: Bladder irritants, relative anatomy and pelvic floor active range of motion, connection between diaphragm and pelvic floor   06/28/23: Neuro re-ed/TherEx seated diaphragmatic breathing with pelvic floor AROM (inhale/lengthen, exhale/shorten) 3x10  Seated pelvic floor quick flicks + diaphragmatic breathing 2x10  Sit to stand + GTB + diaphragmatic breathing 2x12  Bridge + GTB + diaphragmatic breathing 2x12  Sidelying clamshell (GTB) + reverse clamshell + diaphragmatic breathing 2x12  Hooklying alternating march (GTB) + diaphragmatic breathing 2x20  Lower trunk rotations + diaphragmatic breathing 2x10 Reclined hip IR/ER for mobility + diaphragmatic breathing 2x56min  Pretzel piriformis stretch + diaphragmatic breathing 2x30min  Self care: Knack technique  PATIENT EDUCATION:  Education details: Bladder irritants, relative anatomy and pelvic  floor active range of motion, connection between diaphragm and pelvic floor  Person educated: Patient Education method: Explanation, Demonstration, Tactile cues, Verbal cues, and Handouts Education comprehension: verbalized understanding, returned demonstration, verbal cues required, and tactile cues required  HOME EXERCISE PROGRAM: Access Code: [email or web address] URL: [email or web address] Date: 06/28/2023 Prepared by: Earna Coder  Exercises - Seated Pelvic Floor Contraction  - 1 x daily - 7 x weekly - 2 sets - 10 reps - Seated Quick Flick Pelvic Floor Contractions  - 1 x daily - 7 x weekly - 2 sets - 10 reps - Clamshell with Resistance  - 1 x daily - 7 x weekly - 2 sets - 12 reps - Sidelying Reverse Clamshell  - 1 x daily - 7 x weekly - 2 sets - 12 reps - Sit to Stand with Resistance Around Legs  - 1 x daily - 7 x weekly - 2 sets - 15 reps - Supine Bridge with Resistance Band  - 1 x daily - 7 x weekly - 2 sets - 15 reps - Supine March with Resistance Band  - 1 x daily - 7 x weekly - 2 sets - 20 reps - Supine Lower Trunk Rotation  - 1 x daily - 7 x weekly - 2 sets - 20 reps  ASSESSMENT:  CLINICAL IMPRESSION: Patient is a 48 y.o. Female who was seen today for physical therapy treatment for stress urinary incontinence. No episodes of stress or urge incontinence in the past week. Exercises progressed in intensity today with addition of green theraband and core activation in hooklying. Minimal verbal cueing from PT required today. No increase in pain or leakage following today's session. Pt would benefit from additional PT to further address deficits.   OBJECTIVE IMPAIRMENTS: decreased coordination, decreased endurance, decreased mobility, decreased ROM, and decreased strength.   ACTIVITY LIMITATIONS: toileting  PARTICIPATION LIMITATIONS:  none  PERSONAL FACTORS: Past/current experiences and Time since onset of injury/illness/exacerbation are also affecting patient's functional  outcome.   REHAB POTENTIAL: Good  CLINICAL DECISION MAKING: Stable/uncomplicated  EVALUATION COMPLEXITY: Low   GOALS: Goals reviewed with patient? Yes  SHORT TERM GOALS: Target date: 06/10/2023  Pt will be independent with HEP.  Baseline: Goal status: INITIAL  2.  Pt will be independent with the knack, urge suppression technique, and double voiding in order to improve bladder habits and decrease urinary incontinence.   Baseline:  Goal status: INITIAL   LONG TERM GOALS: Target date: 11/10/2023  Pt will be independent with advanced HEP.  Baseline:  Goal status: INITIAL  2.  Pt to demonstrate improved coordination of pelvic floor and breathing mechanics with 10# squat with appropriate synergistic patterns to decrease pain and leakage at least 75% of the time.   Baseline:  Goal status: INITIAL  3.  Pt will demonstrate 4/5 pelvic floor muscle strength with appropriate coordination in order to decrease urinary incontinence, urgency and frequency.   Baseline:  Goal status: INITIAL  PLAN:  PT FREQUENCY: 1x/week  PT DURATION: 8 weeks  PLANNED INTERVENTIONS: 97110-Therapeutic exercises, [id number]-  Therapeutic activity, [id number]- Neuromuscular re-education, [id number]- Self Care, [id number]- Manual therapy, Taping, Dry Needling, Joint mobilization, Spinal mobilization, Scar mobilization, Cryotherapy, and Moist heat  PLAN FOR NEXT SESSION: introduce knack technique, introduce progressive lumbopelvic strength training exercises and mobility stretches   Omar Person, PT 06/28/2023, 9:13 AM

## 2023-07-02 ENCOUNTER — Ambulatory Visit: Payer: Commercial Managed Care - PPO | Admitting: Physical Therapy

## 2023-07-02 DIAGNOSIS — R278 Other lack of coordination: Secondary | ICD-10-CM

## 2023-07-02 DIAGNOSIS — M6281 Muscle weakness (generalized): Secondary | ICD-10-CM

## 2023-07-02 NOTE — Therapy (Signed)
 OUTPATIENT PHYSICAL THERAPY FEMALE PELVIC TREATMENT   Patient Name: COURTENAY HIRTH MRN: [id number] DOB:May 09, 1975, 48 y.o., female Today's Date: 07/02/2023  END OF SESSION:  PT End of Session - 07/02/23 1438     Visit Number 4    Number of Visits 8    Date for PT Re-Evaluation 06/10/23    Authorization Type [facility] [employer]    PT Start Time 0200    PT Stop Time 0240    PT Time Calculation (min) 40 min    Activity Tolerance Patient tolerated treatment well    Behavior During Therapy [facility] for tasks assessed/performed                Past Medical History:  Diagnosis Date   History of hypertension 01/20/2016   HLD (hyperlipidemia)    HTN (hypertension)    Migraine    Ureteral stone    Vaginal Pap smear, abnormal    Remote history of 1 abnormal pap   Past Surgical History:  Procedure Laterality Date   BLADDER SUSPENSION N/A 04/05/2022   Procedure: TRANSVAGINAL TAPE (TVT) PROCEDURE;  Surgeon: Marguerita Beards, MD;  Location: [facility];  Service: Gynecology;  Laterality: N/A;  total time requested is 1 hour   COLONOSCOPY WITH PROPOFOL N/A 07/13/2022   Procedure: COLONOSCOPY WITH PROPOFOL;  Surgeon: Midge Minium, MD;  Location: [facility];  Service: Endoscopy;  Laterality: N/A;   CYSTOSCOPY N/A 04/05/2022   Procedure: CYSTOSCOPY;  Surgeon: Marguerita Beards, MD;  Location[facility];  Service: Gynecology;  Laterality: N/A;   TONSILLECTOMY  2011   Patient Active Problem List   Diagnosis Date Noted   Diarrhea 04/11/2023   Encounter for general adult medical examination with abnormal findings 08/21/2022   Finger pain, left 08/21/2022   Colon cancer screening 07/13/2022   Complete tear of ulnar collateral ligament of metacarpophalangeal (MCP) joint of finger 02/09/2022   Heartburn 02/01/2021   Right ankle sprain 02/01/2021   Joint pain 08/01/2020   Need for hepatitis C screening test 06/04/2020   Neck pain 12/15/2019    Overweight 07/20/2019   Hypertension 03/11/2019   Vitamin D deficiency 03/11/2019   Allergic rhinitis 07/30/2018   Left foot pain 03/04/2018   Skin exam, screening for cancer 03/04/2018   Hypokalemia 01/25/2017   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 She has started softball and is continuing kickball.   Fluid intake: carries around a water bottle but rarely drinks 1 full bottle per day   PAIN:  Are you having pain? No NPRS scale: 0/10  PRECAUTIONS: None  RED FLAGS: None   WEIGHT BEARING RESTRICTIONS: No  FALLS:  Has patient fallen in last 6 months? 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   ACTIVITY LEVEL : walks on treadmill, plays softball and kickball recreation league   PLOF: Independent  PATIENT GOALS: decrease leakage, get a stronger pelvic floor   PERTINENT HISTORY:  Surgery: s/p Midurethral sling (Advantage Fit), cystoscopy on 04/05/22 Sexual abuse: No  BOWEL MOVEMENT: Pain with bowel movement: No Type of bowel movement:Type (Bristol Stool Scale) 4, Frequency 0-1x/day, Strain no, and Splinting no Fully empty rectum: Yes:   Leakage: No Pads: No Fiber supplement/laxative No; takes a probiotic   URINATION: Pain with urination: No Fully empty bladder:  Nofeels like yes Stream: Weak Urgency: Yes  Frequency: every 2 hours usually  Leakage: Bending forward Pads: Yes: wears a light liner   INTERCOURSE:  Ability to have vaginal penetration No  Pain with intercourse: none DrynessNo  PREGNANCY: Vaginal deliveries 1 Tearing Yes: grade 3 Episiotomy No  PROLAPSE: None   OBJECTIVE:  Note: Objective measures were completed at Evaluation unless otherwise noted.  DIAGNOSTIC FINDINGS:  Emptying was dysfunctional with a elevated PVR ( ), a sustained detrusor contraction present, abdominal straining not present, dyssynergic urethral sphincter activity on EMG  COGNITION: Overall cognitive status: Within functional limits for tasks assessed     SENSATION: Light touch: Appears intact  LUMBAR SPECIAL TESTS:  Single leg stance test: Positive  FUNCTIONAL TESTS:  Squat: mild dynamic knee valgus with squat test    GAIT: Comments: mild trendelenburg gait pattern with amb   POSTURE: rounded shoulders and forward head  LUMBARAROM/PROM:  A/PROM A/PROM  eval  Flexion 25% limited  Extension 25% limited  Right lateral flexion 25% limited  Left lateral flexion 25% limited  Right rotation 25% limited  Left rotation 25% limited   (Blank rows = not tested)  LOWER EXTREMITY ROM: WNL  Active ROM Right eval Left eval  Hip flexion    Hip extension    Hip abduction    Hip adduction    Hip internal rotation    Hip external rotation    Knee flexion    Knee extension    Ankle dorsiflexion    Ankle plantarflexion    Ankle inversion    Ankle eversion     (Blank rows = not tested)  LOWER EXTREMITY MMT: 4/5 bilateral hips grossly, 4+/5 bilateral knees grossly   MMT Right eval Left eval  Hip flexion    Hip extension    Hip abduction    Hip adduction    Hip internal rotation    Hip external rotation    Knee flexion    Knee extension    Ankle dorsiflexion    Ankle plantarflexion    Ankle inversion    Ankle eversion      (Blank rows = not tested) PALPATION:   General: no TTP with palpation of adductors/hip flexors   Pelvic Alignment: WNL  Abdominal: decreased rib mobility with diaphragmatic breathing, tends to upper chest breathe, abdominal bracing at rest                External Perineal Exam: minimal  dryness noted                             Internal Pelvic Floor: generally weak throughout bilateral layers of pelvic floor musculature, no trigger points noted, no pain with palpation  Patient confirms identification and approves PT to assess internal pelvic floor and treatment Yes No emotional/communication barriers or cognitive limitation. Patient is motivated to learn. Patient understands and agrees with treatment goals and plan. PT explains patient will be examined in standing, sitting, and lying down to see how their muscles and joints work. When they are ready, they will be asked to remove their underwear so PT can examine their perineum. The patient is also given the option of providing their own chaperone as one is not provided in our facility. The patient also has the right and is explained the right to defer or refuse any part of the evaluation or treatment including the internal exam. With the patient's consent, PT will use one gloved finger to gently assess the muscles of the pelvic floor, seeing how well it contracts and relaxes and if there is muscle symmetry. After, the patient will get dressed and PT and patient will discuss exam findings and plan of care. PT and patient discuss plan of care, schedule, attendance policy and HEP activities.  PELVIC MMT:   MMT eval  Vaginal 3/5, 4 quick flicks, 3 second hold  Internal Anal Sphincter   External Anal Sphincter   Puborectalis   Diastasis Recti   (Blank rows = not tested)       TONE: Generally decreased throughout   PROLAPSE: N/A  TODAY'S TREATMENT:                                                                                                                               DATE:   EVAL 05/13/23: Examination completed, findings reviewed, pt educated on POC, HEP, and self care. Pt motivated to participate in PT and agreeable to attempt recommendations.  Neuro re-ed: Supine diaphragmatic breathing with pelvic floor AROM (inhale/lengthen, exhale/shorten) 3x10  Self care: Bladder irritants, relative anatomy and pelvic floor active range of motion, connection between diaphragm and pelvic floor   06/11/23: Neuro re-ed/TherEx seated diaphragmatic breathing with pelvic floor AROM (inhale/lengthen, exhale/shorten) 3x10  Seated pelvic floor quick flicks + diaphragmatic breathing 2x10  Sit to stand with adductor ball squeeze + diaphragmatic breathing 2x12  Bridge + adductor ball squeeze + diaphragmatic breathing 2x12  Sidelying clamshell + reverse clamshell + diaphragmatic breathing 2x12  Reclined hip IR/ER for mobility + diaphragmatic breathing 2x80min  Pretzel piriformis stretch + diaphragmatic breathing 2x45min  Self care: Bladder irritants, relative anatomy and pelvic floor active range of motion, connection between diaphragm and pelvic floor   06/28/23: Neuro re-ed/TherEx seated diaphragmatic breathing with pelvic floor AROM (inhale/lengthen, exhale/shorten) 3x10  Seated pelvic floor quick flicks + diaphragmatic breathing 2x10  Sit to stand +  GTB + diaphragmatic breathing 2x12  Bridge + GTB + diaphragmatic breathing 2x12  Sidelying clamshell (GTB) + reverse clamshell + diaphragmatic breathing 2x12  Hooklying alternating march (GTB) + diaphragmatic breathing 2x20  Lower trunk rotations + diaphragmatic breathing 2x10 Reclined hip IR/ER for mobility + diaphragmatic breathing 2x5min  Pretzel piriformis stretch + diaphragmatic breathing 2x29min  Self care: Knack technique  07/02/23: Neuro re-ed/TherEx seated diaphragmatic breathing with pelvic floor AROM (inhale/lengthen, exhale/shorten) x12  Seated pelvic floor quick flicks + diaphragmatic  breathing x12  Hooklying alternating march (GTB) + diaphragmatic breathing 2x20  Bridge + single leg kickout + diaphragmatic breathing 2x10 alternating legs  Sidelying clamshell (GTB) + reverse clamshell + diaphragmatic breathing 2x12  Goblet squat + diaphragmatic breathing 2x10 (10# KB) Standing alternating march + lat pull down (RTB) + diaphragmatic breathing  Bird dog + diaphragmatic breathing 2x77min  Therapeutic exercise: Standing hamstring stretch + diaphragmatic breathing 2x28min each side  Standing quad stretch + diaphragmatic breathing 2x20min   PATIENT EDUCATION:  Education details: Bladder irritants, relative anatomy and pelvic floor active range of motion, connection between diaphragm and pelvic floor  Person educated: Patient Education method: Explanation, Demonstration, Tactile cues, Verbal cues, and Handouts Education comprehension: verbalized understanding, returned demonstration, verbal cues required, and tactile cues required  HOME EXERCISE PROGRAM: Access Code: [email or web address] URL: [email or web address] Date: 07/02/2023 Prepared by: Earna Coder  Exercises - Seated Pelvic Floor Contraction  - 1 x daily - 7 x weekly - 1 sets - 12 reps - Seated Quick Flick Pelvic Floor Contractions  - 1 x daily - 7 x weekly - 1 sets - 12 reps - Supine March with Resistance Band  - 1 x daily - 7 x weekly - 2 sets - 20 reps - Clamshell with Resistance  - 1 x daily - 7 x weekly - 2 sets - 12 reps - Sidelying Reverse Clamshell  - 1 x daily - 7 x weekly - 2 sets - 12 reps - Supine Bridge with Leg Extension  - 1 x daily - 7 x weekly - 2 sets - 10 reps - Goblet Squat with Kettlebell  - 1 x daily - 7 x weekly - 2 sets - 10 reps - Bird Dog  - 1 x daily - 7 x weekly - 2 sets - 10 reps - Resistance Pulldown with March  - 1 x daily - 7 x weekly - 2 sets - 12 reps  ASSESSMENT:  CLINICAL IMPRESSION: Patient is a 48 y.o. Female who was seen today for physical therapy treatment for stress  urinary incontinence. No episodes of stress or urge incontinence in the past week. Functional exercises introduced today to progress patient's exercise intensity and to train her pelvic floor under more functional circumstances (squatting, pulling, bending). Minimal verbal cueing from PT required today. No increase in pain or leakage following today's session. Pt would benefit from additional PT to further address deficits.   OBJECTIVE IMPAIRMENTS: decreased coordination, decreased endurance, decreased mobility, decreased ROM, and decreased strength.   ACTIVITY LIMITATIONS: toileting  PARTICIPATION LIMITATIONS:  none  PERSONAL FACTORS: Past/current experiences and Time since onset of injury/illness/exacerbation are also affecting patient's functional outcome.   REHAB POTENTIAL: Good  CLINICAL DECISION MAKING: Stable/uncomplicated  EVALUATION COMPLEXITY: Low   GOALS: Goals reviewed with patient? Yes  SHORT TERM GOALS: Target date: 06/10/2023  Pt will be independent with HEP.  Baseline: Goal status: INITIAL  2.  Pt will be independent with the knack, urge suppression technique, and double voiding in order to  improve bladder habits and decrease urinary incontinence.   Baseline:  Goal status: INITIAL   LONG TERM GOALS: Target date: 11/10/2023  Pt will be independent with advanced HEP.  Baseline:  Goal status: INITIAL  2.  Pt to demonstrate improved coordination of pelvic floor and breathing mechanics with 10# squat with appropriate synergistic patterns to decrease pain and leakage at least 75% of the time.   Baseline:  Goal status: INITIAL  3.  Pt will demonstrate 4/5 pelvic floor muscle strength with appropriate coordination in order to decrease urinary incontinence, urgency and frequency.   Baseline:  Goal status: INITIAL  PLAN:  PT FREQUENCY: 1x/week  PT DURATION: 8 weeks  PLANNED INTERVENTIONS: 97110-Therapeutic exercises, 97530- Therapeutic activity, 97112-  Neuromuscular re-education, 97535- Self Care, 16109- Manual therapy, Taping, Dry Needling, Joint mobilization, Spinal mobilization, Scar mobilization, Cryotherapy, and Moist heat  PLAN FOR NEXT SESSION: discharge day!  Omar Person, PT 07/02/2023, 2:38 PM

## 2023-07-17 ENCOUNTER — Other Ambulatory Visit: Payer: Self-pay

## 2023-07-17 MED FILL — Montelukast Sodium Tab 10 MG (Base Equiv): ORAL | 30 days supply | Qty: 30 | Fill #3 | Status: AC

## 2023-07-26 ENCOUNTER — Encounter: Payer: Self-pay | Admitting: Physical Therapy

## 2023-08-09 ENCOUNTER — Ambulatory Visit: Admitting: Obstetrics and Gynecology

## 2023-08-09 ENCOUNTER — Encounter: Payer: Self-pay | Admitting: Obstetrics and Gynecology

## 2023-08-09 VITALS — BP 132/84 | HR 74

## 2023-08-09 DIAGNOSIS — N393 Stress incontinence (female) (male): Secondary | ICD-10-CM

## 2023-08-09 DIAGNOSIS — N3281 Overactive bladder: Secondary | ICD-10-CM | POA: Diagnosis not present

## 2023-08-09 NOTE — Progress Notes (Signed)
 [facility] Return Visit  SUBJECTIVE  History of Present Illness: Anna Kim is a 48 y.o. female seen in follow-up for incontinence. She is s/p Midurethral sling (Advantage Fit), cystoscopy on 04/05/22. After surgery, continues to have a small amount of leakage. Repeat UDS showed SUI and DO but she was not able to empty during the study (pt reported this was not typical and she was just unable to relax with catheter in place). She tried vesicare  but had blurred vision and no improvement in leakage. She was referred to pelvic PT and has had 4 sessions.   Currently, can play sports or run and has not had any leakage. No leakage with cough or sneeze. She wears a panty liner and it is wet but does not really change throughout the day. Can hold her bladder for several hours at a time without urgency. She does not feel she is having any straining with urination and feels she has a strong urine stream.  Drinks: 1 bottle soda per day, water , occasional powerade with zero sugar   Past Medical History: Patient  has a past medical history of History of hypertension (01/20/2016), HLD (hyperlipidemia), HTN (hypertension), Migraine, Ureteral stone, and Vaginal Pap smear, abnormal.   Past Surgical History: She  has a past surgical history that includes Tonsillectomy (2011); Bladder suspension (N/A, 04/05/2022); Cystoscopy (N/A, 04/05/2022); and Colonoscopy with propofol  (N/A, 07/13/2022).   Medications: She has a current medication list which includes the following prescription(s): amlodipine , cholecalciferol, levocetirizine, montelukast , norethindrone  acetate-ethinyl estradiol , omeprazole , pravastatin , and [DISCONTINUED] calcium  carb-cholecalciferol.   Allergies: Patient is allergic to ceftriaxone , erythromycin, and sulfa antibiotics.   Social History: Patient  reports that she has never smoked. She has never used smokeless tobacco. She reports current alcohol use. She reports that she does  not use drugs.     OBJECTIVE     Physical Exam: Vitals:   08/09/23 0831  BP: 132/84  Pulse: 74   Gen: No apparent distress, A&O x 3.  Detailed Urogynecologic Evaluation:  Deferred.    Post Void Residual - 08/09/23 0838       Post Void Residual   Post Void Residual 26 mL               ASSESSMENT AND PLAN    Anna Kim is a 48 y.o. with:  1. SUI (stress urinary incontinence, female)   2. Overactive bladder     - Normal PVR today - She has had some improvement with pelvic PT so will continue with those exercises.  - We discussed that we could consider another medication for OAB although unclear if this would be helpful since she does not have frequent urgency or frequency. Could also consider urethral bulking, but not guaranteed to make her completely dry. Advised to decrease soda intake.  - Overall she is happy with her current progress and does not want any further intervention at this time.   Follow up as needed.   Arma Lamp, MD  Time spent: I spent 20 minutes dedicated to the care of this patient on the date of this encounter to include pre-visit review of records, face-to-face time with the patient and post visit documentation.

## 2023-08-09 NOTE — Patient Instructions (Signed)

## 2023-08-20 ENCOUNTER — Other Ambulatory Visit
Admission: RE | Admit: 2023-08-20 | Discharge: 2023-08-20 | Disposition: A | Discharge status: Home / Self Care | Attending: Family Medicine | Admitting: Family Medicine

## 2023-08-20 ENCOUNTER — Ambulatory Visit: Payer: Self-pay | Admitting: Family Medicine

## 2023-08-20 DIAGNOSIS — E538 Deficiency of other specified B group vitamins: Secondary | ICD-10-CM | POA: Diagnosis not present

## 2023-08-20 DIAGNOSIS — E785 Hyperlipidemia, unspecified: Secondary | ICD-10-CM | POA: Insufficient documentation

## 2023-08-20 DIAGNOSIS — E559 Vitamin D deficiency, unspecified: Secondary | ICD-10-CM | POA: Insufficient documentation

## 2023-08-20 DIAGNOSIS — I10 Essential (primary) hypertension: Secondary | ICD-10-CM | POA: Diagnosis not present

## 2023-08-20 LAB — LIPID PANEL
Cholesterol: 175 mg/dL (ref 0–200)
HDL: 50 mg/dL (ref >40)
LDL Cholesterol: 98 mg/dL (ref 0–99)
Total CHOL/HDL Ratio: 3.5 ratio
Triglycerides: 136 mg/dL (ref <150)
VLDL: 27 mg/dL (ref 0–40)

## 2023-08-20 LAB — COMPREHENSIVE METABOLIC PANEL WITH GFR
ALT: 14 U/L (ref 0–44)
AST: 19 U/L (ref 15–41)
Albumin: 3.7 g/dL (ref 3.5–5.0)
Alkaline Phosphatase: 74 U/L (ref 38–126)
Anion gap: 9 (ref 5–15)
BUN: 13 mg/dL (ref 6–20)
CO2: 25 mmol/L (ref 22–32)
Calcium: 8.8 mg/dL — ABNORMAL LOW (ref 8.9–10.3)
Chloride: 106 mmol/L (ref 98–111)
Creatinine, Ser: 0.74 mg/dL (ref 0.44–1.00)
GFR, Estimated: >60 mL/min (ref >60)
Glucose, Bld: 102 mg/dL — ABNORMAL HIGH (ref 70–99)
Potassium: 3.4 mmol/L — ABNORMAL LOW (ref 3.5–5.1)
Sodium: 140 mmol/L (ref 135–145)
Total Bilirubin: 0.5 mg/dL (ref 0.0–1.2)
Total Protein: 7 g/dL (ref 6.5–8.1)

## 2023-08-20 LAB — VITAMIN D 25 HYDROXY (VIT D DEFICIENCY, FRACTURES): Vit D, 25-Hydroxy: 108.15 ng/mL — ABNORMAL HIGH (ref 30–100)

## 2023-08-20 LAB — VITAMIN B12: Vitamin B-12: 449 pg/mL (ref 180–914)

## 2023-08-23 ENCOUNTER — Other Ambulatory Visit: Payer: Self-pay

## 2023-08-23 ENCOUNTER — Ambulatory Visit (INDEPENDENT_AMBULATORY_CARE_PROVIDER_SITE_OTHER): Payer: 59 | Admitting: Family Medicine

## 2023-08-23 ENCOUNTER — Encounter: Payer: Self-pay | Admitting: Family Medicine

## 2023-08-23 VITALS — BP 120/68 | HR 67 | Temp 98.6°F | Resp 20 | Ht 66.0 in | Wt 181.4 lb

## 2023-08-23 DIAGNOSIS — I10 Essential (primary) hypertension: Secondary | ICD-10-CM | POA: Diagnosis not present

## 2023-08-23 DIAGNOSIS — R12 Heartburn: Secondary | ICD-10-CM

## 2023-08-23 DIAGNOSIS — Z0000 Encounter for general adult medical examination without abnormal findings: Secondary | ICD-10-CM

## 2023-08-23 DIAGNOSIS — E876 Hypokalemia: Secondary | ICD-10-CM

## 2023-08-23 DIAGNOSIS — E673 Hypervitaminosis D: Secondary | ICD-10-CM | POA: Diagnosis not present

## 2023-08-23 DIAGNOSIS — J309 Allergic rhinitis, unspecified: Secondary | ICD-10-CM | POA: Diagnosis not present

## 2023-08-23 MED ORDER — MONTELUKAST SODIUM 10 MG PO TABS
10.0000 mg | ORAL_TABLET | Freq: Every day | ORAL | 3 refills | Status: DC
  Filled 2023-08-23: qty 30, 30d supply, fill #0
  Filled 2023-10-14: qty 30, 30d supply, fill #1
  Filled 2023-11-12: qty 30, 30d supply, fill #2
  Filled 2023-12-10: qty 30, 30d supply, fill #3

## 2023-08-23 MED ORDER — OMEPRAZOLE 20 MG PO CPDR
20.0000 mg | DELAYED_RELEASE_CAPSULE | Freq: Every day | ORAL | 3 refills | Status: DC
  Filled 2023-08-23 – 2023-10-14 (×2): qty 90, 90d supply, fill #0
  Filled 2024-01-10: qty 90, 90d supply, fill #1

## 2023-08-23 NOTE — Patient Instructions (Addendum)
 It was a pleasure meeting you today. Thank you for allowing me to take part in your health care.  Our goals for today as we discussed include:  Schedule lab appointment week of Jun 16 to recheck potassium and Vitamin D   Refills sent for requested medications    This is a list of the screening recommended for you and due dates:  Health Maintenance  Topic Date Due   COVID-19 Vaccine (3 - 2024-25 season) 11/25/2022   Mammogram  10/01/2023   Flu Shot  10/25/2023   Pap with HPV screening  04/25/2025   DTaP/Tdap/Td vaccine (2 - Tdap) 11/23/2026   Colon Cancer Screening  07/12/2032   Hepatitis C Screening  Completed   HIV Screening  Addressed   HPV Vaccine  Aged Out   Meningitis B Vaccine  Aged Out      If you have any questions or concerns, please do not hesitate to call the office at [phone].  I look forward to our next visit and until then take care and stay safe.  Regards,   Valli Gaw, MD   [facility]

## 2023-08-23 NOTE — Progress Notes (Signed)
 SUBJECTIVE:   Chief Complaint  Patient presents with   Annual Exam   HPI Presents for annual physical  Discussed the use of AI scribe software for clinical note transcription with the patient, who gave verbal consent to proceed.  History of Present Illness Anna Kim is a 48 year old female who presents for an annual physical exam.  She mentions that her Pap smear is due in 2027 and her mammogram is due around July of this year. She plans to follow up on the mammogram order as she is unsure if it has been placed.  She has a history of low potassium levels, which was noted again recently. She recalls previous instances of low potassium during episodes of kidney stones and food poisoning. Her grandmother also had issues with potassium levels. She is currently on high blood pressure medication but not on any medication known to lower potassium levels.  She is currently taking Prilosec and Singulair . She has switched to taking Prilosec at night due to experiencing heartburn and nausea when taken in the morning. This change has alleviated her symptoms over the past three weeks.  She has been on low estrogen birth control pills for many years, which has resulted in the absence of periods for several years. She initially switched to this type of birth control due to side effects from a previous type. She has not experienced any significant menopausal symptoms such as hot flashes or mood swings during the week she does not take the pills.  No chest pain, shortness of breath, palpitations, diarrhea, vomiting, or constipation. She is not currently experiencing any menopausal symptoms and has not had a period in years.   PERTINENT PMH / PSH: As above  OBJECTIVE:  BP 120/68   Pulse 67   Temp 98.6 F (37 C)   Resp 20   Ht 5\' 6"  (1.676 m)   Wt 181 lb 6 oz (82.3 kg)   SpO2 99%   BMI 29.27 kg/m    Physical Exam Vitals reviewed.  Constitutional:      General: She is not in acute  distress.    Appearance: She is not ill-appearing.  HENT:     Head: Normocephalic.     Right Ear: Tympanic membrane, ear canal and external ear normal.     Left Ear: Tympanic membrane, ear canal and external ear normal.     Nose: Nose normal.     Mouth/Throat:     Mouth: Mucous membranes are moist.  Eyes:     Extraocular Movements: Extraocular movements intact.     Conjunctiva/sclera: Conjunctivae normal.     Pupils: Pupils are equal, round, and reactive to light.  Neck:     Thyroid : No thyromegaly or thyroid  tenderness.     Vascular: No carotid bruit.  Cardiovascular:     Rate and Rhythm: Normal rate and regular rhythm.     Pulses: Normal pulses.     Heart sounds: Normal heart sounds.  Pulmonary:     Effort: Pulmonary effort is normal.     Breath sounds: Normal breath sounds.  Abdominal:     General: Bowel sounds are normal. There is no distension.     Palpations: Abdomen is soft.     Tenderness: There is no abdominal tenderness. There is no right CVA tenderness, left CVA tenderness, guarding or rebound.  Musculoskeletal:        General: Normal range of motion.     Cervical back: Normal range of motion.  Right lower leg: No edema.     Left lower leg: No edema.  Lymphadenopathy:     Cervical: No cervical adenopathy.  Skin:    Capillary Refill: Capillary refill takes less than 2 seconds.  Neurological:     General: No focal deficit present.     Mental Status: She is alert and oriented to person, place, and time. Mental status is at baseline.     Motor: No weakness.  Psychiatric:        Mood and Affect: Mood normal.        Behavior: Behavior normal.        Thought Content: Thought content normal.        Judgment: Judgment normal.           08/23/2023    8:23 AM 04/29/2023    9:53 AM 04/11/2023    9:12 AM 02/26/2023    8:35 AM 08/21/2022    8:30 AM  Depression screen PHQ 2/9  Decreased Interest 0 0 0 0 0  Down, Depressed, Hopeless 0 0 0 0 0  PHQ - 2 Score 0 0 0 0  0  Altered sleeping 0 0 0 0 0  Tired, decreased energy 1 0 0 0 0  Change in appetite 0 0 0 0 0  Feeling bad or failure about yourself  0 0 0 0 0  Trouble concentrating 0 0 0 0 0  Moving slowly or fidgety/restless 0 0 0 0 0  Suicidal thoughts 0 0 0 0 0  PHQ-9 Score 1 0 0 0 0  Difficult doing work/chores Not difficult at all Not difficult at all Not difficult at all Not difficult at all Not difficult at all      08/23/2023    8:23 AM 04/29/2023    9:53 AM 04/11/2023    9:12 AM 02/26/2023    8:35 AM  GAD 7 : Generalized Anxiety Score  Nervous, Anxious, on Edge 0 0 0 0  Control/stop worrying 0 0 0 0  Worry too much - different things 0 0 0 0  Trouble relaxing 0 0 0 0  Restless 0 0 0 0  Easily annoyed or irritable 0 0 0 0  Afraid - awful might happen 0 0 0 0  Total GAD 7 Score 0 0 0 0  Anxiety Difficulty Not difficult at all Not difficult at all Not difficult at all Not difficult at all    ASSESSMENT/PLAN:  Annual physical exam Assessment & Plan: Mammogram due July. Pap smear due 2027. Vaccinations up to date. Shingles vaccine needed at age 45. - Follow up on mammogram scheduling with OB/GYN. - Consider shingles vaccine at age 82. - Annual labs today - Depression screening negative   Heartburn Assessment & Plan: GERD symptoms managed with Prilosec. Heartburn and nausea resolved with nighttime dosing. - Refill Prilosec 20 mg qhs   Orders: -     Omeprazole ; Take 1 capsule (20 mg total) by mouth daily.  Dispense: 90 capsule; Refill: 3  Allergic rhinitis, unspecified seasonality, unspecified trigger -     Montelukast  Sodium; Take 1 tablet (10 mg total) by mouth at bedtime.  Dispense: 30 tablet; Refill: 3  High vitamin D  level Assessment & Plan: Vitamin D  level recently elevated Patient had stopped supplements as recommended Recheck Vitamin D   Orders: -     VITAMIN D  25 Hydroxy (Vit-D Deficiency, Fractures); Future  Hypokalemia Assessment & Plan: Mild hypokalemia noted  without symptoms. Previous episodes linked to kidney stones and  food poisoning.  - Increase intake of potassium-rich foods such as bananas and kiwis. - Repeat blood work in June to reassess potassium levels. - Consider further work up if remains low  Orders: -     Basic metabolic panel with GFR; Future  Primary hypertension Assessment & Plan: Well controlled  -Continue Amlodipine  5mg  at night.    PDMP reviewed  Return if symptoms worsen or fail to improve, for PCP.  Valli Gaw, MD

## 2023-08-27 ENCOUNTER — Encounter: Payer: Self-pay | Admitting: Family Medicine

## 2023-08-27 DIAGNOSIS — E673 Hypervitaminosis D: Secondary | ICD-10-CM

## 2023-08-27 DIAGNOSIS — Z0000 Encounter for general adult medical examination without abnormal findings: Secondary | ICD-10-CM

## 2023-08-27 HISTORY — DX: Hypervitaminosis D: E67.3

## 2023-08-27 HISTORY — DX: Encounter for general adult medical examination without abnormal findings: Z00.00

## 2023-08-27 NOTE — Assessment & Plan Note (Addendum)
 Mild hypokalemia noted without symptoms. Previous episodes linked to kidney stones and food poisoning.  - Increase intake of potassium-rich foods such as bananas and kiwis. - Repeat blood work in June to reassess potassium levels. - Consider further work up if remains low

## 2023-08-27 NOTE — Assessment & Plan Note (Signed)
 Well controlled  -Continue Amlodipine  5mg  at night.

## 2023-08-27 NOTE — Assessment & Plan Note (Signed)
 Vitamin D  level recently elevated Patient had stopped supplements as recommended Recheck Vitamin D 

## 2023-08-27 NOTE — Assessment & Plan Note (Signed)
 Mammogram due July. Pap smear due 2027. Vaccinations up to date. Shingles vaccine needed at age 48. - Follow up on mammogram scheduling with OB/GYN. - Consider shingles vaccine at age 41. - Annual labs today - Depression screening negative

## 2023-08-27 NOTE — Assessment & Plan Note (Signed)
 GERD symptoms managed with Prilosec. Heartburn and nausea resolved with nighttime dosing. - Refill Prilosec 20 mg qhs

## 2023-08-27 NOTE — Assessment & Plan Note (Signed)
>>  ASSESSMENT AND PLAN FOR HEARTBURN WRITTEN ON 08/27/2023 11:29 AM BY WALSH, TANYA, MD  GERD symptoms managed with Prilosec. Heartburn and nausea resolved with nighttime dosing. - Refill Prilosec 20 mg qhs

## 2023-08-30 ENCOUNTER — Ambulatory Visit: Payer: Self-pay | Attending: Obstetrics and Gynecology | Admitting: Physical Therapy

## 2023-08-30 DIAGNOSIS — M6281 Muscle weakness (generalized): Secondary | ICD-10-CM | POA: Diagnosis not present

## 2023-08-30 NOTE — Therapy (Signed)
 OUTPATIENT PHYSICAL THERAPY FEMALE PELVIC TREATMENT   Patient Name: Anna Kim MRN: [id number] DOB:1975/10/08, 48 y.o., female Today's Date: 08/30/2023  END OF SESSION:  PT End of Session - 08/30/23 0837     Visit Number 5    Number of Visits 8    Date for PT Re-Evaluation 06/10/23    Authorization Type [employer]    PT Start Time 0800    PT Stop Time 0840    PT Time Calculation (min) 40 min    Activity Tolerance Patient tolerated treatment well    Behavior During Therapy [facility] for tasks assessed/performed                 Past Medical History:  Diagnosis Date   History of hypertension 01/20/2016   HLD (hyperlipidemia)    HTN (hypertension)    Migraine    Ureteral stone    Vaginal Pap smear, abnormal    Remote history of 1 abnormal pap   Past Surgical History:  Procedure Laterality Date   BLADDER SUSPENSION N/A 04/05/2022   Procedure: TRANSVAGINAL TAPE (TVT) PROCEDURE;  Surgeon: Arma Lamp, MD;  Location: [facility];  Service: Gynecology;  Laterality: N/A;  total time requested is 1 hour   COLONOSCOPY WITH PROPOFOL  N/A 07/13/2022   Procedure: COLONOSCOPY WITH PROPOFOL ;  Surgeon: Marnee Sink, MD;  Location: [facility];  Service: Endoscopy;  Laterality: N/A;   CYSTOSCOPY N/A 04/05/2022   Procedure: CYSTOSCOPY;  Surgeon: Arma Lamp, MD;  Location: [facility];  Service: Gynecology;  Laterality: N/A;   TONSILLECTOMY  2011   Patient Active Problem List   Diagnosis Date Noted   Annual physical exam 08/27/2023   High vitamin D  level 08/27/2023   Diarrhea 04/11/2023   Encounter for general adult medical examination with abnormal findings 08/21/2022   Finger pain, left 08/21/2022   Colon cancer screening 07/13/2022   Complete tear of ulnar collateral ligament of metacarpophalangeal (MCP) joint of finger 02/09/2022   Stiffness of right hand joint 02/01/2022   Rupture of ulnar collateral ligament of  thumb 01/04/2022   Heartburn 02/01/2021   Right ankle sprain 02/01/2021   Joint pain 08/01/2020   Need for hepatitis C screening test 06/04/2020   Neck pain 12/15/2019   Overweight 07/20/2019   Hypertension 03/11/2019   Vitamin D  deficiency 03/11/2019   Allergic rhinitis 07/30/2018   Left foot pain 03/04/2018   Skin exam, screening for cancer 03/04/2018   Hypokalemia 01/25/2017   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 She has had a small amount of leakage during the week - but she cannot pinpoint when exactly. Leakage is not where it used to be.   Fluid intake: carries around a water  bottle but rarely drinks 1 full bottle per day   PAIN:  Are you having pain? No NPRS scale: 0/10  PRECAUTIONS: None  RED FLAGS: None   WEIGHT BEARING RESTRICTIONS: No  FALLS:  Has patient fallen in last 6 months? 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   ACTIVITY LEVEL : walks on treadmill, plays softball and kickball recreation league   PLOF: Independent  PATIENT GOALS: decrease leakage, get a stronger pelvic floor   PERTINENT HISTORY:  Surgery: s/p Midurethral sling ([employer]), cystoscopy on 04/05/22 Sexual abuse: No  BOWEL MOVEMENT: Pain with bowel movement: No Type of bowel  movement:Type (Bristol Stool Scale) 4, Frequency 0-1x/day, Strain no, and Splinting no Fully empty rectum: Yes:   Leakage: No Pads: No Fiber supplement/laxative No; takes a probiotic   URINATION: Pain with urination: No Fully empty bladder: Nofeels like yes Stream: Weak Urgency: Yes  Frequency: every 2 hours usually  Leakage: Bending forward Pads: Yes: wears a light liner   INTERCOURSE:  Ability to have vaginal penetration No  Pain with intercourse: none DrynessNo  PREGNANCY: Vaginal deliveries 1 Tearing Yes: grade 3 Episiotomy No  PROLAPSE: None   OBJECTIVE:  Note: Objective measures were completed at Evaluation unless otherwise noted.  DIAGNOSTIC FINDINGS:  Emptying was dysfunctional with a elevated PVR ( ), a sustained detrusor contraction present, abdominal straining not present, dyssynergic urethral sphincter activity on EMG  COGNITION: Overall cognitive status: Within functional limits for tasks assessed     SENSATION: Light touch: Appears intact  LUMBAR SPECIAL TESTS:  Single leg stance test: Positive  FUNCTIONAL TESTS:  Squat: mild dynamic knee valgus with squat test   GAIT: Comments: mild trendelenburg gait pattern with amb   POSTURE: rounded shoulders and forward head  LUMBARAROM/PROM:  A/PROM A/PROM  eval  Flexion 25% limited  Extension 25% limited  Right lateral flexion 25% limited  Left lateral flexion 25% limited  Right rotation 25% limited  Left rotation 25% limited   (Blank rows = not tested)  LOWER EXTREMITY ROM: WNL  Active ROM Right eval Left eval  Hip flexion    Hip extension    Hip abduction    Hip adduction    Hip internal rotation    Hip external rotation    Knee flexion    Knee extension    Ankle dorsiflexion    Ankle plantarflexion    Ankle inversion    Ankle eversion     (Blank rows = not tested)  LOWER EXTREMITY MMT: 4/5 bilateral hips grossly, 4+/5 bilateral knees grossly   MMT Right eval Left eval   Hip flexion    Hip extension    Hip abduction    Hip adduction    Hip internal rotation    Hip external rotation    Knee flexion    Knee extension    Ankle dorsiflexion    Ankle plantarflexion    Ankle inversion    Ankle eversion     (Blank rows = not tested) PALPATION:   General: no TTP with palpation of adductors/hip flexors   Pelvic Alignment: WNL  Abdominal: decreased rib mobility with diaphragmatic breathing, tends to upper chest breathe, abdominal  bracing at rest                External Perineal Exam: minimal dryness noted                             Internal Pelvic Floor: generally weak throughout bilateral layers of pelvic floor musculature, no trigger points noted, no pain with palpation  Patient confirms identification and approves PT to assess internal pelvic floor and treatment Yes No emotional/communication barriers or cognitive limitation. Patient is motivated to learn. Patient understands and agrees with treatment goals and plan. PT explains patient will be examined in standing, sitting, and lying down to see how their muscles and joints work. When they are ready, they will be asked to remove their underwear so PT can examine their perineum. The patient is also given the option of providing their own chaperone as one is not provided in our facility. The patient also has the right and is explained the right to defer or refuse any part of the evaluation or treatment including the internal exam. With the patient's consent, PT will use one gloved finger to gently assess the muscles of the pelvic floor, seeing how well it contracts and relaxes and if there is muscle symmetry. After, the patient will get dressed and PT and patient will discuss exam findings and plan of care. PT and patient discuss plan of care, schedule, attendance policy and HEP activities.  PELVIC MMT:   MMT eval  Vaginal 3/5, 4 quick flicks, 3 second hold  Internal Anal Sphincter   External Anal  Sphincter   Puborectalis   Diastasis Recti   (Blank rows = not tested)       TONE: Generally decreased throughout   PROLAPSE: N/A  TODAY'S TREATMENT:                                                                                                                              DATE:   06/28/23: Neuro re-ed/TherEx seated diaphragmatic breathing with pelvic floor AROM (inhale/lengthen, exhale/shorten) 3x10  Seated pelvic floor quick flicks + diaphragmatic breathing 2x10  Sit to stand + GTB + diaphragmatic breathing 2x12  Bridge + GTB + diaphragmatic breathing 2x12  Sidelying clamshell (GTB) + reverse clamshell + diaphragmatic breathing 2x12  Hooklying alternating march (GTB) + diaphragmatic breathing 2x20  Lower trunk rotations + diaphragmatic breathing 2x10 Reclined hip IR/ER for mobility + diaphragmatic breathing 2x47min  Pretzel piriformis stretch + diaphragmatic breathing 2x47min  Self care: Knack technique  07/02/23: Neuro re-ed/TherEx seated diaphragmatic breathing with pelvic floor AROM (inhale/lengthen, exhale/shorten) x12  Seated pelvic floor quick flicks + diaphragmatic breathing x12  Hooklying alternating march (GTB) + diaphragmatic breathing 2x20  Bridge + single leg kickout + diaphragmatic breathing 2x10 alternating legs  Sidelying clamshell (GTB) + reverse clamshell + diaphragmatic breathing 2x12  Goblet squat + diaphragmatic breathing 2x10 (10# KB) Standing alternating march + lat  pull down (RTB) + diaphragmatic breathing  Bird dog + diaphragmatic breathing 2x2min  Therapeutic exercise: Standing hamstring stretch + diaphragmatic breathing 2x87min each side  Standing quad stretch + diaphragmatic breathing 2x83min   08/30/23:  Neuro re-ed/TherEx seated diaphragmatic breathing with pelvic floor AROM (inhale/lengthen, exhale/shorten) x12  standing diaphragmatic breathing with pelvic floor AROM (inhale/lengthen, exhale/shorten) x12  Seated/standing pelvic floor quick flicks +  diaphragmatic breathing x12  standing alternating march (GTB) + diaphragmatic breathing 2x20  Single leg bridge + diaphragmatic breathing 2x10  Sidelying clamshell (GTB) + reverse clamshell + diaphragmatic breathing 2x12  Goblet squat + GTB + diaphragmatic breathing 2x10 (10# KB) Standing overhead press (2# dumbbells) + single leg march + diaphragmatic breathing 2x10  Bird dog knee taps + diaphragmatic breathing 2x70min each side  Therapeutic exercise: Standing hamstring stretch + diaphragmatic breathing 2x60min each side  Standing quad stretch + diaphragmatic breathing 2x71min  Self care  Urge drill Knack technique for stress urinary incontinence management Double voiding education and how to fully empty the bladder when voiding   PATIENT EDUCATION:  Education details: Bladder irritants, relative anatomy and pelvic floor active range of motion, connection between diaphragm and pelvic floor  Person educated: Patient Education method: Explanation, Demonstration, Tactile cues, Verbal cues, and Handouts Education comprehension: verbalized understanding, returned demonstration, verbal cues required, and tactile cues required  HOME EXERCISE PROGRAM: Access Code: [email or web address] URL: [email or web address] Date: 08/30/2023 Prepared by: Robbin Chill  Exercises - Seated Pelvic Floor Contraction  - 1 x daily - 7 x weekly - 1 sets - 12 reps - Standing Pelvic Floor Contraction  - 1 x daily - 7 x weekly - 1 sets - 10 reps - Seated Quick Flick Pelvic Floor Contractions  - 1 x daily - 7 x weekly - 1 sets - 12 reps - Supine March with Resistance Band  - 1 x daily - 7 x weekly - 2 sets - 20 reps - Seated March with Resistance  - 1 x daily - 7 x weekly - 2 sets - 16 reps - Clamshell with Resistance  - 1 x daily - 7 x weekly - 2 sets - 12 reps - Sidelying Reverse Clamshell  - 1 x daily - 7 x weekly - 2 sets - 12 reps - Supine Bridge with Leg Extension  - 1 x daily - 7 x weekly - 2 sets - 10  reps - Single Leg Bridge  - 1 x daily - 7 x weekly - 2 sets - 10 reps - Bird Dog  - 1 x daily - 7 x weekly - 2 sets - 10 reps - Bird Dog with Knee Taps  - 1 x daily - 7 x weekly - 2 sets - 10 reps - Goblet Squat with Kettlebell  - 1 x daily - 7 x weekly - 2 sets - 10 reps - Squat with Resistance at Thighs  - 1 x daily - 7 x weekly - 2 sets - 10 reps - Resistance Pulldown with March  - 1 x daily - 7 x weekly - 2 sets - 12 reps - Standing March with Alternating [facility]  - 1 x daily - 7 x weekly - 2 sets - 10 reps  ASSESSMENT:  CLINICAL IMPRESSION: Patient has attended 5 sessions of PFPT and is demonstrating great progress. She is no longer leaking randomly, and her overall stress urinary incontinence symptoms have almost fully resolved. She will still leak when standing up from the toilet occasionally after voiding, so  we reviewed double voiding technique and pelvic floor toileting techniques for emptying. Exercises progressed in intensity today and discussion with patient surrounding how to continue implementing pelvic PT after discharge. Patient tolerated session well and has met all goals at this time, is appropriate for discharge.  OBJECTIVE IMPAIRMENTS: decreased coordination, decreased endurance, decreased mobility, decreased ROM, and decreased strength.   ACTIVITY LIMITATIONS: toileting  PARTICIPATION LIMITATIONS: none  PERSONAL FACTORS: Past/current experiences and Time since onset of injury/illness/exacerbation are also affecting patient's functional outcome.   REHAB POTENTIAL: Good  CLINICAL DECISION MAKING: Stable/uncomplicated  EVALUATION COMPLEXITY: Low   GOALS: Goals reviewed with patient? Yes  SHORT TERM GOALS: Target date: 06/10/2023  Pt will be independent with HEP.  Baseline: Goal status: GOAL MET 08/30/23  2.  Pt will be independent with the knack, urge suppression technique, and double voiding in order to improve bladder habits and decrease urinary  incontinence.   Baseline:  Goal status: GOAL MET 08/30/23   LONG TERM GOALS: Target date: 11/10/2023  Pt will be independent with advanced HEP.  Baseline:  Goal status: GOAL MET 08/30/23  2.  Pt to demonstrate improved coordination of pelvic floor and breathing mechanics with 10# squat with appropriate synergistic patterns to decrease pain and leakage at least 75% of the time.   Baseline:  Goal status: GOAL MET 08/30/23  3.  Pt will demonstrate 4/5 pelvic floor muscle strength with appropriate coordination in order to decrease urinary incontinence, urgency and frequency.   Baseline:  Goal status: GOAL MET 08/30/23  PHYSICAL THERAPY DISCHARGE SUMMARY  Visits from Start of Care: 5  Current functional level related to goals / functional outcomes: See above   Remaining deficits: See above   Education / Equipment: See above   Patient agrees to discharge. Patient goals were met. Patient is being discharged due to being pleased with the current functional level.   Marni Sins, PT 08/30/2023, 8:37 AM

## 2023-09-04 ENCOUNTER — Ambulatory Visit: Payer: Self-pay | Admitting: *Deleted

## 2023-09-04 NOTE — Telephone Encounter (Signed)
 Called and spoke with pt she stated she no longer has symptoms she stated she had symptoms on Saturday and some this morning she denies SOB, radiating pain, or swelling. I informed pt that if she develops any more symptoms and the chest pain returns she should seek medical attention as soon as possible at ED.

## 2023-09-04 NOTE — Telephone Encounter (Signed)
 FYI Only or Action Required?: FYI only for provider  Patient was last seen in primary care on 08/23/2023 by Anna Gaw, MD. Called Nurse Triage reporting Chest Pain. Symptoms began several days ago. Interventions attempted: Nothing. Symptoms are: stable.  Triage Disposition: See Physician Within 24 Hours  Patient/caregiver understands and will follow disposition?: yes- with ED precautions  Reason for Disposition  [1] Chest pain lasts > 5 minutes AND [2] occurred > 3 days ago (72 hours) AND [3] NO chest pain or cardiac symptoms now  Answer Assessment - Initial Assessment Questions 1. LOCATION: Where does it hurt?       R side- breast area- positional- pain with bending twisting 2. RADIATION: Does the pain go anywhere else? (e.g., into neck, jaw, arms, back)     No radiation 3. ONSET: When did the chest pain begin? (Minutes, hours or days)      Saturday- changing position turning over in the bed, sitting-twist/turning 4. PATTERN: Does the pain come and go, or has it been constant since it started?  Does it get worse with exertion?      Not all the time- changing position only, not with exertion 5. DURATION: How long does it last (e.g., seconds, minutes, hours)     Goes away with reposition- Saturday lasted over 5 minutes- but other than that less than 5 minutes 6. SEVERITY: How bad is the pain?  (e.g., Scale 1-10; mild, moderate, or severe)    - MILD (1-3): doesn't interfere with normal activities     - MODERATE (4-7): interferes with normal activities or awakens from sleep    - SEVERE (8-10): excruciating pain, unable to do any normal activities       4/10 7. CARDIAC RISK FACTORS: Do you have any history of heart problems or risk factors for heart disease? (e.g., angina, prior heart attack; diabetes, high blood pressure, high cholesterol, smoker, or strong family history of heart disease)     no 8. PULMONARY RISK FACTORS: Do you have any history of lung disease?  (e.g.,  blood clots in lung, asthma, emphysema, birth control pills)     Birth control pills 9. CAUSE: What do you think is causing the chest pain?     unsure 10. OTHER SYMPTOMS: Do you have any other symptoms? (e.g., dizziness, nausea, vomiting, sweating, fever, difficulty breathing, cough)       Last week coughing at night  Protocols used: Chest Pain-A-AH    Copied from CRM [id number]. Topic: Clinical - Red Word Triage >> Sep 04, 2023  1:20 PM [name] wrote: [facility] that prompted transfer to Nurse Triage: Pain on right side on chest, some on Saturday and started again today. No issues breathing, deep breath sometimes feel pain, if she rolls on one side chest hurts but not all the time she feels symptoms.

## 2023-09-04 NOTE — Telephone Encounter (Signed)
 Scheduled for 09/05/2023 with [name], NP

## 2023-09-05 ENCOUNTER — Encounter: Payer: Self-pay | Admitting: Nurse Practitioner

## 2023-09-05 ENCOUNTER — Ambulatory Visit: Admitting: Nurse Practitioner

## 2023-09-05 VITALS — BP 110/70 | HR 75 | Temp 98.5°F | Ht 66.0 in | Wt 180.8 lb

## 2023-09-05 DIAGNOSIS — R0782 Intercostal pain: Secondary | ICD-10-CM | POA: Diagnosis not present

## 2023-09-05 NOTE — Progress Notes (Signed)
 Anna Burkitt, NP-C Phone: [phone]  Anna Kim is a 48 y.o. female who presents today for chest wall pain.  Discussed the use of AI scribe software for clinical note transcription with the patient, who gave verbal consent to proceed.  History of Present Illness   Anna Kim is a 48 year old female who presents with right-sided chest pain.  She has been experiencing intermittent right-sided chest pain since Saturday, which began after taking a nap. The pain is positional, resolving with changes in position. On Sunday and Monday, she did not experience any pain, but it recurred on Wednesday at work, prompting her colleagues to advise her to seek medical attention.  The pain is non-radiating and not associated with shortness of breath or jaw pain. She recalls a history of coughing when lying down the previous week. The pain is located on the right side, around the breast area, and is described as an uncomfortable sensation rather than severe pain. She is unable to reproduce the pain by touch.  She denies any recent heavy lifting or significant arm movements that could have contributed to the pain. She has not experienced the pain since Wednesday and is currently pain-free. No radiation of the pain, shortness of breath, or other associated symptoms.      Social History   Tobacco Use  Smoking Status Never  Smokeless Tobacco Never    Current Outpatient Medications on File Prior to Visit  Medication Sig Dispense Refill   amLODipine  (NORVASC ) 5 MG tablet Take 1 tablet (5 mg total) by mouth daily. 90 tablet 3   levocetirizine (XYZAL) 5 MG tablet Take 5 mg by mouth every evening.     montelukast  (SINGULAIR ) 10 MG tablet Take 1 tablet (10 mg total) by mouth at bedtime. 30 tablet 3   Norethindrone  Acetate-Ethinyl Estradiol  (LARIN  1.5/30) 1.5-30 MG-MCG tablet Take 1 tablet by mouth daily. 21 tablet 11   omeprazole  (PRILOSEC) 20 MG capsule Take 1 capsule (20 mg total) by mouth daily.  90 capsule 3   pravastatin  (PRAVACHOL ) 40 MG tablet Take 1 tablet (40 mg total) by mouth daily. 90 tablet 3   [DISCONTINUED] Calcium  Carb-Cholecalciferol (CALCIUM  + D3 PO) Take 1 tablet by mouth daily.     No current facility-administered medications on file prior to visit.     ROS see history of present illness  Objective  Physical Exam Vitals:   09/05/23 0955  BP: 110/70  Pulse: 75  Temp: 98.5 F (36.9 C)  SpO2: 95%    BP Readings from Last 3 Encounters:  09/05/23 110/70  08/23/23 120/68  08/09/23 132/84   Wt Readings from Last 3 Encounters:  09/05/23 180 lb 12.8 oz (82 kg)  08/23/23 181 lb 6 oz (82.3 kg)  04/29/23 177 lb 4 oz (80.4 kg)    Physical Exam Constitutional:      General: She is not in acute distress.    Appearance: Normal appearance.  HENT:     Head: Normocephalic.   Cardiovascular:     Rate and Rhythm: Normal rate and regular rhythm.     Heart sounds: Normal heart sounds.  Pulmonary:     Effort: Pulmonary effort is normal.     Breath sounds: Normal breath sounds.  Chest:     Chest wall: Tenderness present.    Skin:    General: Skin is warm and dry.   Neurological:     General: No focal deficit present.     Mental Status: She is alert.  Psychiatric:        Mood and Affect: Mood normal.        Behavior: Behavior normal.      Assessment/Plan: Please see individual problem list.  Intercostal pain Assessment & Plan: Intermittent right-sided chest pain is likely musculoskeletal in nature. EKG without acute findings, compared to prior in chart. Discussed using prednisone  and muscle relaxers for severe pain. She politely declined today and would like to continue to monitor. She will monitor symptoms for changes. Advised immediate care for worsening chest pain or shortness of breath.   Orders: -     EKG 12-Lead      Return in 3 months (on 12/20/2023) for TOC as scheduled.   Anna Burkitt, NP-C [facility]

## 2023-09-09 ENCOUNTER — Ambulatory Visit: Payer: Self-pay | Admitting: Family Medicine

## 2023-09-09 ENCOUNTER — Other Ambulatory Visit
Admission: RE | Admit: 2023-09-09 | Discharge: 2023-09-09 | Disposition: A | Discharge status: Home / Self Care | Attending: Family Medicine | Admitting: Family Medicine

## 2023-09-09 DIAGNOSIS — E673 Hypervitaminosis D: Secondary | ICD-10-CM | POA: Insufficient documentation

## 2023-09-09 DIAGNOSIS — E876 Hypokalemia: Secondary | ICD-10-CM | POA: Diagnosis not present

## 2023-09-09 LAB — BASIC METABOLIC PANEL WITH GFR
Anion gap: 7 (ref 5–15)
BUN: 9 mg/dL (ref 6–20)
CO2: 27 mmol/L (ref 22–32)
Calcium: 9 mg/dL (ref 8.9–10.3)
Chloride: 107 mmol/L (ref 98–111)
Creatinine, Ser: 0.69 mg/dL (ref 0.44–1.00)
GFR, Estimated: >60 mL/min (ref >60)
Glucose, Bld: 96 mg/dL (ref 70–99)
Potassium: 3.6 mmol/L (ref 3.5–5.1)
Sodium: 141 mmol/L (ref 135–145)

## 2023-09-09 LAB — VITAMIN D 25 HYDROXY (VIT D DEFICIENCY, FRACTURES): Vit D, 25-Hydroxy: 93.25 ng/mL (ref 30–100)

## 2023-09-12 ENCOUNTER — Encounter: Payer: Self-pay | Admitting: Nurse Practitioner

## 2023-09-12 DIAGNOSIS — R0782 Intercostal pain: Secondary | ICD-10-CM | POA: Insufficient documentation

## 2023-09-12 NOTE — Assessment & Plan Note (Signed)
 Intermittent right-sided chest pain is likely musculoskeletal in nature. EKG without acute findings, compared to prior in chart. Discussed using prednisone  and muscle relaxers for severe pain. She politely declined today and would like to continue to monitor. She will monitor symptoms for changes. Advised immediate care for worsening chest pain or shortness of breath.

## 2023-09-17 ENCOUNTER — Other Ambulatory Visit: Payer: Self-pay | Admitting: Certified Nurse Midwife

## 2023-09-17 DIAGNOSIS — Z1231 Encounter for screening mammogram for malignant neoplasm of breast: Secondary | ICD-10-CM

## 2023-10-02 ENCOUNTER — Ambulatory Visit
Admission: RE | Admit: 2023-10-02 | Discharge: 2023-10-02 | Disposition: A | Discharge status: Home / Self Care | Source: Ambulatory Visit | Attending: Certified Nurse Midwife | Admitting: Certified Nurse Midwife

## 2023-10-02 DIAGNOSIS — Z1231 Encounter for screening mammogram for malignant neoplasm of breast: Secondary | ICD-10-CM | POA: Diagnosis not present

## 2023-10-14 ENCOUNTER — Other Ambulatory Visit: Payer: Self-pay | Admitting: Certified Nurse Midwife

## 2023-10-14 ENCOUNTER — Other Ambulatory Visit: Payer: Self-pay

## 2023-10-15 ENCOUNTER — Other Ambulatory Visit: Payer: Self-pay

## 2023-10-16 ENCOUNTER — Other Ambulatory Visit: Payer: Self-pay

## 2023-11-04 ENCOUNTER — Other Ambulatory Visit: Payer: Self-pay

## 2023-11-04 ENCOUNTER — Other Ambulatory Visit: Payer: Self-pay | Admitting: Certified Nurse Midwife

## 2023-11-12 ENCOUNTER — Other Ambulatory Visit: Payer: Self-pay

## 2023-11-12 ENCOUNTER — Other Ambulatory Visit: Payer: Self-pay | Admitting: Certified Nurse Midwife

## 2023-11-19 DIAGNOSIS — H5213 Myopia, bilateral: Secondary | ICD-10-CM | POA: Diagnosis not present

## 2023-12-01 ENCOUNTER — Other Ambulatory Visit: Payer: Self-pay | Admitting: Certified Nurse Midwife

## 2023-12-01 ENCOUNTER — Other Ambulatory Visit: Payer: Self-pay

## 2023-12-02 ENCOUNTER — Other Ambulatory Visit: Payer: Self-pay

## 2023-12-02 ENCOUNTER — Other Ambulatory Visit: Payer: Self-pay | Admitting: Certified Nurse Midwife

## 2023-12-05 ENCOUNTER — Other Ambulatory Visit: Payer: Self-pay

## 2023-12-05 ENCOUNTER — Other Ambulatory Visit: Payer: Self-pay | Admitting: Certified Nurse Midwife

## 2023-12-06 ENCOUNTER — Other Ambulatory Visit: Payer: Self-pay

## 2023-12-06 ENCOUNTER — Encounter: Payer: Self-pay | Admitting: Certified Nurse Midwife

## 2023-12-06 DIAGNOSIS — Z3041 Encounter for surveillance of contraceptive pills: Secondary | ICD-10-CM

## 2023-12-06 MED ORDER — NORETHINDRONE ACET-ETHINYL EST 1.5-30 MG-MCG PO TABS
1.0000 | ORAL_TABLET | Freq: Every day | ORAL | 0 refills | Status: DC
  Filled 2023-12-06: qty 21, 21d supply, fill #0

## 2023-12-06 NOTE — Progress Notes (Signed)
 Patient has annual scheduled 01/09/24 with Zelda Hummer CNM.  Refill of birth control until she can be seen.

## 2023-12-20 ENCOUNTER — Encounter: Admitting: Nurse Practitioner

## 2023-12-27 DIAGNOSIS — H02831 Dermatochalasis of right upper eyelid: Secondary | ICD-10-CM | POA: Diagnosis not present

## 2023-12-27 DIAGNOSIS — H02834 Dermatochalasis of left upper eyelid: Secondary | ICD-10-CM | POA: Diagnosis not present

## 2024-01-02 ENCOUNTER — Other Ambulatory Visit: Payer: Self-pay

## 2024-01-02 DIAGNOSIS — Z3041 Encounter for surveillance of contraceptive pills: Secondary | ICD-10-CM

## 2024-01-02 MED ORDER — NORETHINDRONE ACET-ETHINYL EST 1.5-30 MG-MCG PO TABS
1.0000 | ORAL_TABLET | Freq: Every day | ORAL | 0 refills | Status: DC
Start: 2024-01-02 — End: 2024-01-21
  Filled 2024-01-02: qty 21, 21d supply, fill #0

## 2024-01-02 NOTE — Progress Notes (Signed)
 Patient has annual scheduled with Zelda Hummer CNM 01/21/24.  Refill authorized until she can be seen.

## 2024-01-09 ENCOUNTER — Ambulatory Visit: Admitting: Certified Nurse Midwife

## 2024-01-10 ENCOUNTER — Other Ambulatory Visit: Payer: Self-pay

## 2024-01-13 ENCOUNTER — Other Ambulatory Visit: Payer: Self-pay

## 2024-01-14 ENCOUNTER — Other Ambulatory Visit: Payer: Self-pay

## 2024-01-15 ENCOUNTER — Other Ambulatory Visit: Payer: Self-pay

## 2024-01-15 ENCOUNTER — Other Ambulatory Visit: Payer: Self-pay | Admitting: Medical Genetics

## 2024-01-15 DIAGNOSIS — Z006 Encounter for examination for normal comparison and control in clinical research program: Secondary | ICD-10-CM

## 2024-01-16 ENCOUNTER — Other Ambulatory Visit: Payer: Self-pay

## 2024-01-17 ENCOUNTER — Other Ambulatory Visit: Payer: Self-pay

## 2024-01-20 ENCOUNTER — Other Ambulatory Visit: Payer: Self-pay | Admitting: Nurse Practitioner

## 2024-01-20 ENCOUNTER — Other Ambulatory Visit: Payer: Self-pay

## 2024-01-20 DIAGNOSIS — J309 Allergic rhinitis, unspecified: Secondary | ICD-10-CM

## 2024-01-20 NOTE — Patient Instructions (Signed)
 Preventive Care 50-50 Years Old, Female Preventive care refers to lifestyle choices and visits with your health care provider that can promote health and wellness. Preventive care visits are also called wellness exams. What can I expect for my preventive care visit? Counseling Your health care provider may ask you questions about your: Medical history, including: Past medical problems. Family medical history. Pregnancy history. Current health, including: Menstrual cycle. Method of birth control. Emotional well-being. Home life and relationship well-being. Sexual activity and sexual health. Lifestyle, including: Alcohol, nicotine or tobacco, and drug use. Access to firearms. Diet, exercise, and sleep habits. Work and work Astronomer. Sunscreen use. Safety issues such as seatbelt and bike helmet use. Physical exam Your health care provider will check your: Height and weight. These may be used to calculate your BMI (body mass index). BMI is a measurement that tells if you are at a healthy weight. Waist circumference. This measures the distance around your waistline. This measurement also tells if you are at a healthy weight and may help predict your risk of certain diseases, such as type 2 diabetes and high blood pressure. Heart rate and blood pressure. Body temperature. Skin for abnormal spots. What immunizations do I need?  Vaccines are usually given at various ages, according to a schedule. Your health care provider will recommend vaccines for you based on your age, medical history, and lifestyle or other factors, such as travel or where you work. What tests do I need? Screening Your health care provider may recommend screening tests for certain conditions. This may include: Lipid and cholesterol levels. Diabetes screening. This is done by checking your blood sugar (glucose) after you have not eaten for a while (fasting). Pelvic exam and Pap test. Hepatitis B test. Hepatitis C  test. HIV (human immunodeficiency virus) test. STI (sexually transmitted infection) testing, if you are at risk. Lung cancer screening. Colorectal cancer screening. Mammogram. Talk with your health care provider about when you should start having regular mammograms. This may depend on whether you have a family history of breast cancer. BRCA-related cancer screening. This may be done if you have a family history of breast, ovarian, tubal, or peritoneal cancers. Bone density scan. This is done to screen for osteoporosis. Talk with your health care provider about your test results, treatment options, and if necessary, the need for more tests. Follow these instructions at home: Eating and drinking  Eat a diet that includes fresh fruits and vegetables, whole grains, lean protein, and low-fat dairy products. Take vitamin and mineral supplements as recommended by your health care provider. Do not drink alcohol if: Your health care provider tells you not to drink. You are pregnant, may be pregnant, or are planning to become pregnant. If you drink alcohol: Limit how much you have to 0-1 drink a day. Know how much alcohol is in your drink. In the [place] one drink equals one 12 oz bottle of beer (355 mL), one 5 oz glass of wine (148 mL), or one 1 oz glass of hard liquor (44 mL). Lifestyle Brush your teeth every morning and night with fluoride toothpaste. Floss one time each day. Exercise for at least 30 minutes 5 or more days each week. Do not use any products that contain nicotine or tobacco. These products include cigarettes, chewing tobacco, and vaping devices, such as e-cigarettes. If you need help quitting, ask your health care provider. Do not use drugs. If you are sexually active, practice safe sex. Use a condom or other form of protection to  prevent STIs. If you do not wish to become pregnant, use a form of birth control. If you plan to become pregnant, see your health care provider for a  prepregnancy visit. Take aspirin only as told by your health care provider. Make sure that you understand how much to take and what form to take. Work with your health care provider to find out whether it is safe and beneficial for you to take aspirin daily. Find healthy ways to manage stress, such as: Meditation, yoga, or listening to music. Journaling. Talking to a trusted person. Spending time with friends and family. Minimize exposure to UV radiation to reduce your risk of skin cancer. Safety Always wear your seat belt while driving or riding in a vehicle. Do not drive: If you have been drinking alcohol. Do not ride with someone who has been drinking. When you are tired or distracted. While texting. If you have been using any mind-altering substances or drugs. Wear a helmet and other protective equipment during sports activities. If you have firearms in your house, make sure you follow all gun safety procedures. Seek help if you have been physically or sexually abused. What's next? Visit your health care provider once a year for an annual wellness visit. Ask your health care provider how often you should have your eyes and teeth checked. Stay up to date on all vaccines. This information is not intended to replace advice given to you by your health care provider. Make sure you discuss any questions you have with your health care provider. Document Revised: 09/07/2020 Document Reviewed: 09/07/2020 Elsevier Patient Education  2024 [employer] Inc. How to Do a Breast Self-Exam Doing breast self-exams can help you stay healthy. They're one way to know what's normal for your breasts. They can help you catch a problem while it's still small and can be treated. You need to: Check your breasts often. Tell your doctor about any changes. You should do breast self-exams even if you have breast implants. What you need: A mirror. A well-lit room. A pillow or other soft object. How to do a  breast self-exam Look for changes  Take off all the clothes above your waist. Stand in front of a mirror in a room with good lighting. Put your hands down at your sides. Compare your breasts in the mirror. Look for difference between them, such as: Differences in shape. Differences in size. Wrinkles, dips, and bumps in one breast and not the other. Look at each breast for skin changes, such as: Redness. Scaly spots. Spots where your skin is thicker. Dimpling. Open sores. Look for changes in your nipples, such as: Fluid coming out of a nipple. Fluid around a nipple. Bleeding. Dimpling. Redness. A nipple that looks pushed in or that has changed position. Feel for changes Lie on your back. Feel each breast. To do this: Pick a breast to feel. Place a pillow under the shoulder closest to that breast. Put the arm closest to that breast behind your head. Feel the breast using the hand of your other arm. Use the pads of your three middle fingers to make small circles starting near the nipple. Use light, medium, and firm pressure. Keep making circles, moving down over the breast. Stop when you feel your ribs. Start making circles with your fingers again, this time going up until you reach your collarbone. Then, make circles out across your breast and into your armpit area. Squeeze your nipple. Check for fluid and lumps. Do these steps again  to check your other breast. Sit or stand in the tub or shower. With soapy water on your skin, feel each breast the same way you did when you were lying down. Write down what you find Writing down what you find can help you keep track of what you want to tell your doctor. Write down: What's normal for each breast. Any changes you find. Write down: The kind of change. If your breast feels tender or painful. Any lump you find. Write down its size and where it is. When you last had your period. General tips If you're breastfeeding, the best time  to check your breasts is after you feed your baby or after you use a breast pump. If you get a period, the best time to check your breasts is 5-7 days after your period ends. With time, you'll get more used to doing the self-exam. You'll also start to know if there are changes in your breasts. Contact a doctor if: You see a change in the shape or size of your breasts or nipples. You see a change in the skin of your breast or nipples. You have fluid coming from your nipples that isn't normal. You find a new lump or thick area. You have breast pain. You have any concerns about your breast health. This information is not intended to replace advice given to you by your health care provider. Make sure you discuss any questions you have with your health care provider. Document Revised: 05/22/2023 Document Reviewed: 05/22/2023 Elsevier Patient Education  2025 [employer].

## 2024-01-20 NOTE — Progress Notes (Unsigned)
 GYNECOLOGY ANNUAL PREVENTATIVE CARE ENCOUNTER NOTE  History:     Anna Kim is a 48 y.o. G59P1001 female here for a routine annual gynecologic exam.  Current complaints: none.   Denies abnormal vaginal bleeding, discharge, pelvic pain, problems with intercourse or other gynecologic concerns.     Social Relationship:Divorced Living: with daughter Work Full Time- Radiology ( Intervention) Exercise:2-3x week walking Smoke/Alcohol/drug use:No/ Occasional/ No  Gynecologic History No LMP recorded (lmp unknown). (Menstrual status: Oral contraceptives). Contraception: none and discontinuing OCP , will start HRT  Last Pap: 04/25/2020. Results were: normal with negative HPV Last mammogram: 10/07/2023. Results were: normal Colonoscopy 06/2023 : normal   Obstetric History OB History  Gravida Para Term Preterm AB Living  1 1 1   1   SAB IAB Ectopic Multiple Live Births      1    # Outcome Date GA Lbr Len/2nd Weight Sex Type Anes PTL Lv  1 Term 09/23/04 [redacted]w[redacted]d  8 lb (3.629 kg) F Vag-Spont  N LIV    Past Medical History:  Diagnosis Date   History of hypertension 01/20/2016   HLD (hyperlipidemia)    HTN (hypertension)    Migraine    Ureteral stone    Vaginal Pap smear, abnormal    Remote history of 1 abnormal pap    Past Surgical History:  Procedure Laterality Date   BLADDER SUSPENSION N/A 04/05/2022   Procedure: TRANSVAGINAL TAPE (TVT) PROCEDURE;  Surgeon: Marilynne Rosaline SAILOR, MD;  Location: [facility];  Service: Gynecology;  Laterality: N/A;  total time requested is 1 hour   COLONOSCOPY WITH PROPOFOL  N/A 07/13/2022   Procedure: COLONOSCOPY WITH PROPOFOL ;  Surgeon: Jinny Carmine, MD;  Location[facility];  Service: Endoscopy;  Laterality: N/A;   CYSTOSCOPY N/A 04/05/2022   Procedure: CYSTOSCOPY;  Surgeon: Marilynne Rosaline SAILOR, MD;  Location: [facility];  Service: Gynecology;  Laterality: N/A;   TONSILLECTOMY  2011     Current Outpatient Medications on File Prior to Visit  Medication Sig Dispense Refill   amLODipine  (NORVASC ) 5 MG tablet Take 1 tablet (5 mg total) by mouth daily. 90 tablet 3   levocetirizine (XYZAL) 5 MG tablet Take 5 mg by mouth every evening.     montelukast  (SINGULAIR ) 10 MG tablet Take 1 tablet (10 mg total) by mouth at bedtime. 30 tablet 3   Norethindrone  Acetate-Ethinyl Estradiol  (LARIN  1.5/30) 1.5-30 MG-MCG tablet Take 1 tablet by mouth daily. 21 tablet 0   omeprazole  (PRILOSEC) 20 MG capsule Take 1 capsule (20 mg total) by mouth daily. 90 capsule 3   pravastatin  (PRAVACHOL ) 40 MG tablet Take 1 tablet (40 mg total) by mouth daily. 90 tablet 3   [DISCONTINUED] Calcium  Carb-Cholecalciferol (CALCIUM  + D3 PO) Take 1 tablet by mouth daily.     No current facility-administered medications on file prior to visit.    Allergies  Allergen Reactions   Ceftriaxone  Hives   Erythromycin Other (See Comments)    GI Upset   Sulfa Antibiotics Rash    Social History:  reports that she has never smoked. She has never used smokeless tobacco. She reports current alcohol use. She reports that she does not use drugs.  Family History  Problem Relation Age of Onset   Cancer Maternal Aunt        ovarian   Hypertension Mother    Hypertension Father    Atrial fibrillation Father    Kidney cancer Neg Hx  Bladder Cancer Neg Hx    Breast cancer Neg Hx     The following portions of the patient's history were reviewed and updated as appropriate: allergies, current medications, past family history, past medical history, past social history, past surgical history and problem list.  Review of Systems Pertinent items noted in HPI and remainder of comprehensive ROS otherwise negative.  Physical Exam:  BP 136/81   Pulse 70   Ht 5' 6 (1.676 m)   Wt 188 lb 1.6 oz (85.3 kg)   LMP  (LMP Unknown) Comment: patient reports no menses since starting contraception.  BMI 30.36 kg/m  CONSTITUTIONAL:  Well-developed, well-nourished female in no acute distress.  HENT:  Normocephalic, atraumatic, External right and left ear normal. Oropharynx is clear and moist EYES: Conjunctivae and EOM are normal. Pupils are equal, round, and reactive to light. No scleral icterus.  NECK: Normal range of motion, supple, no masses.  Normal thyroid .  SKIN: Skin is warm and dry. No rash noted. Not diaphoretic. No erythema. No pallor. MUSCULOSKELETAL: Normal range of motion. No tenderness.  No cyanosis, clubbing, or edema.  2+ distal pulses. NEUROLOGIC: Alert and oriented to person, place, and time. Normal reflexes, muscle tone coordination.  PSYCHIATRIC: Normal mood and affect. Normal behavior. Normal judgment and thought content. CARDIOVASCULAR: Normal heart rate noted, regular rhythm RESPIRATORY: Clear to auscultation bilaterally. Effort and breath sounds normal, no problems with respiration noted. BREASTS: Symmetric in size. No masses, tenderness, skin changes, nipple drainage, or lymphadenopathy bilaterally.  ABDOMEN: Soft, no distention noted.  No tenderness, rebound or guarding.  PELVIC: Normal appearing external genitalia and urethral meatus; normal appearing vaginal mucosa and cervix.  No abnormal discharge noted.  Pap smear not due.  Normal uterine size, no other palpable masses, no uterine or adnexal tenderness.  .   Assessment and Plan:  Annual [facility]  . Pap: not due  Mammogram : ordered  Labs: none  Refills/order: activella  Referral: none  Discussed cessation of OCP and use of HRT for perimenopause/ menopausal symptoms. She would like to start HRT. Orders placed.  Routine preventative health maintenance measures emphasized. Please refer to After Visit Summary for other counseling recommendations.      Anna Kim, CNM [facility]  [facility],  [facility]

## 2024-01-21 ENCOUNTER — Ambulatory Visit (INDEPENDENT_AMBULATORY_CARE_PROVIDER_SITE_OTHER): Admitting: Certified Nurse Midwife

## 2024-01-21 ENCOUNTER — Other Ambulatory Visit: Payer: Self-pay

## 2024-01-21 ENCOUNTER — Encounter: Payer: Self-pay | Admitting: Certified Nurse Midwife

## 2024-01-21 VITALS — BP 136/81 | HR 70 | Ht 66.0 in | Wt 188.1 lb

## 2024-01-21 DIAGNOSIS — Z01419 Encounter for gynecological examination (general) (routine) without abnormal findings: Secondary | ICD-10-CM

## 2024-01-21 MED ORDER — ESTRADIOL-NORETHINDRONE ACET 1-0.5 MG PO TABS
1.0000 | ORAL_TABLET | Freq: Every day | ORAL | 3 refills | Status: AC
  Filled 2024-01-21: qty 84, 84d supply, fill #0
  Filled 2024-04-11: qty 84, 84d supply, fill #1

## 2024-01-23 ENCOUNTER — Other Ambulatory Visit: Payer: Self-pay

## 2024-01-24 ENCOUNTER — Other Ambulatory Visit: Payer: Self-pay

## 2024-01-24 DIAGNOSIS — J309 Allergic rhinitis, unspecified: Secondary | ICD-10-CM

## 2024-01-24 MED FILL — Montelukast Sodium Tab 10 MG (Base Equiv): ORAL | 30 days supply | Qty: 30 | Fill #0 | Status: CN

## 2024-01-27 ENCOUNTER — Other Ambulatory Visit: Payer: Self-pay

## 2024-01-27 MED FILL — Montelukast Sodium Tab 10 MG (Base Equiv): ORAL | 30 days supply | Qty: 30 | Fill #0 | Status: AC

## 2024-01-28 DIAGNOSIS — F5089 Other specified eating disorder: Secondary | ICD-10-CM | POA: Diagnosis not present

## 2024-01-29 ENCOUNTER — Other Ambulatory Visit: Payer: Self-pay

## 2024-01-29 ENCOUNTER — Ambulatory Visit

## 2024-01-29 VITALS — BP 124/80 | HR 72 | Temp 98.7°F | Ht 66.0 in | Wt 185.0 lb

## 2024-01-29 DIAGNOSIS — M25511 Pain in right shoulder: Secondary | ICD-10-CM | POA: Diagnosis not present

## 2024-01-29 DIAGNOSIS — E673 Hypervitaminosis D: Secondary | ICD-10-CM

## 2024-01-29 DIAGNOSIS — R7303 Prediabetes: Secondary | ICD-10-CM

## 2024-01-29 DIAGNOSIS — K219 Gastro-esophageal reflux disease without esophagitis: Secondary | ICD-10-CM

## 2024-01-29 DIAGNOSIS — E782 Mixed hyperlipidemia: Secondary | ICD-10-CM

## 2024-01-29 DIAGNOSIS — E785 Hyperlipidemia, unspecified: Secondary | ICD-10-CM

## 2024-01-29 DIAGNOSIS — R12 Heartburn: Secondary | ICD-10-CM

## 2024-01-29 DIAGNOSIS — E663 Overweight: Secondary | ICD-10-CM

## 2024-01-29 DIAGNOSIS — I10 Essential (primary) hypertension: Secondary | ICD-10-CM | POA: Diagnosis not present

## 2024-01-29 DIAGNOSIS — I8393 Asymptomatic varicose veins of bilateral lower extremities: Secondary | ICD-10-CM | POA: Diagnosis not present

## 2024-01-29 DIAGNOSIS — G8929 Other chronic pain: Secondary | ICD-10-CM

## 2024-01-29 DIAGNOSIS — J3089 Other allergic rhinitis: Secondary | ICD-10-CM

## 2024-01-29 DIAGNOSIS — E876 Hypokalemia: Secondary | ICD-10-CM

## 2024-01-29 DIAGNOSIS — R197 Diarrhea, unspecified: Secondary | ICD-10-CM

## 2024-01-29 LAB — COMPREHENSIVE METABOLIC PANEL WITH GFR
ALT: 9 U/L (ref 0–35)
AST: 15 U/L (ref 0–37)
Albumin: 4.1 g/dL (ref 3.5–5.2)
Alkaline Phosphatase: 68 U/L (ref 39–117)
BUN: 9 mg/dL (ref 6–23)
CO2: 28 meq/L (ref 19–32)
Calcium: 8.8 mg/dL (ref 8.4–10.5)
Chloride: 105 meq/L (ref 96–112)
Creatinine, Ser: 0.73 mg/dL (ref 0.40–1.20)
GFR: 96.2 mL/min (ref >60.00)
Glucose, Bld: 89 mg/dL (ref 70–99)
Potassium: 3.6 meq/L (ref 3.5–5.1)
Sodium: 140 meq/L (ref 135–145)
Total Bilirubin: 0.4 mg/dL (ref 0.2–1.2)
Total Protein: 6.8 g/dL (ref 6.0–8.3)

## 2024-01-29 LAB — HEMOGLOBIN A1C: Hgb A1c MFr Bld: 5.9 % (ref 4.6–6.5)

## 2024-01-29 MED ORDER — AMLODIPINE BESYLATE 5 MG PO TABS
5.0000 mg | ORAL_TABLET | Freq: Every day | ORAL | 3 refills | Status: AC
  Filled 2024-01-29 – 2024-04-13 (×2): qty 90, 90d supply, fill #0

## 2024-01-29 MED ORDER — PRAVASTATIN SODIUM 40 MG PO TABS
40.0000 mg | ORAL_TABLET | Freq: Every day | ORAL | 3 refills | Status: AC
  Filled 2024-01-29 – 2024-04-11 (×2): qty 90, 90d supply, fill #0

## 2024-01-29 MED ORDER — OMEPRAZOLE 20 MG PO CPDR
20.0000 mg | DELAYED_RELEASE_CAPSULE | Freq: Every day | ORAL | 3 refills | Status: AC
  Filled 2024-01-29 – 2024-04-11 (×2): qty 90, 90d supply, fill #0

## 2024-01-29 NOTE — Assessment & Plan Note (Addendum)
-   Previously elevated vitamin D  levels. Not currently taking supplements. Ordered repeat vitamin D  level. Orders:   Vitamin D  (25 hydroxy)

## 2024-01-29 NOTE — Assessment & Plan Note (Addendum)
 Symptoms controlled with Singulair  10 mg  and Xyzal 5 mg daily.  Discussed Flonase  for exacerbations.

## 2024-01-29 NOTE — Assessment & Plan Note (Deleted)
 SABRA

## 2024-01-29 NOTE — Assessment & Plan Note (Addendum)
-   Hypertension controlled with amlodipine  5 mg daily. Continue. Check CMP  Report any worsening of leg swelling.  Orders:   amLODipine  (NORVASC ) 5 MG tablet; Take 1 tablet (5 mg total) by mouth daily.   Comp Met (CMET)

## 2024-01-29 NOTE — Assessment & Plan Note (Addendum)
-   Chronic, stable on Pravastatin  40 mg daily. Continue. Reviewed lipid panel from 07/2023.  Orders:   pravastatin  (PRAVACHOL ) 40 MG tablet; Take 1 tablet (40 mg total) by mouth daily.   Comp Met (CMET)

## 2024-01-29 NOTE — Progress Notes (Signed)
 Established Patient Office Visit   Subjective  Patient ID: Anna Kim, female    DOB: [birth year]  Age: 48 y.o. MRN: [id number]  Chief Complaint  Patient presents with   Establish Care   Shoulder Pain    Discussed the use of AI scribe software for clinical note transcription with the patient, who gave verbal consent to proceed.  History of Present Illness Anna Kim is a 48 year old female who presents for a transfer of care from previous PCP.   She has a history of hypertension and is currently taking amlodipine  5 mg daily. She does not regularly monitor her blood pressure at home but notes improvement since switching from HCTZ to amlodipine , with no episodes of erratic blood pressure or hypokalemia. No chest pain or palpitations.  She has a history of gastroesophageal reflux disease and has been taking Prilosec 20 mg daily for over a year. She initially started the medication due to frequent antacid use and noticed its effectiveness when she missed a dose and experienced discomfort. No EGD.   She has been experiencing intermittent right shoulder pain for several months, exacerbated by activities such as throwing a ball or mowing the lawn. The pain is intermittent and described as 'aggravating' rather than severe. She has a history of a shoulder injury from 2002-2003, which required physical therapy and a cortisone injection, but she has not had issues since then until recently.  She has a history of low vitamin D  levels, which were previously elevated after supplementation. She has stopped taking vitamin D  supplements and only takes a multivitamin. Her vitamin D  levels are due for re-evaluation.  She takes pravastatin  40 mg daily and has been on it for several years. LDL from 08/20/23 was 98. Tolerating it well.   A1c borderline in prediabetic range in the past, however A1c from 02/26/23 was 5.5%. She is trying to increase her physical activity by walking more and  participating in recreational kickball and softball, but she is concerned about weight gain.  She experiences perennial allergic rhinitis and takes Singulair  10 mg daily and Xyzal 5 mg daily. She has used Flonase  in the past during peak allergy seasons.  She has a history of varicose veins and some leg swelling, which started when she was pregnant. 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  Currently on Activella (estradiol -norethindrone ) per Anna Kim, CNM. Does gynecological evaluation through [facility], last on 01/21/24   ROS As per HPI    Objective:     BP 124/80 (Cuff Size: Normal)   Pulse 72   Temp 98.7 F (37.1 C) (Oral)   Ht 5' 6 (1.676 m)   Wt 185 lb (83.9 kg)   LMP  (LMP Unknown) Comment: patient reports no menses since starting contraception.  SpO2 98%   BMI 29.86 kg/m      01/29/2024    9:11 AM 08/23/2023    8:23 AM 04/29/2023    9:53 AM  Depression screen PHQ 2/9  Decreased Interest 0 0 0  Down, Depressed, Hopeless 0 0 0  PHQ - 2 Score 0 0 0  Altered sleeping 0 0 0  Tired, decreased energy 0 1 0  Change in appetite 0 0 0  Feeling bad or failure about yourself  0 0 0  Trouble concentrating 0 0 0  Moving slowly or fidgety/restless 0 0 0  Suicidal thoughts 0 0 0  PHQ-9 Score 0 1 0  Difficult doing work/chores Not difficult at all Not difficult at all Not difficult at all      01/29/2024    9:11 AM 08/23/2023    8:23 AM 04/29/2023    9:53 AM 04/11/2023    9:12 AM  GAD 7 : Generalized Anxiety Score  Nervous, Anxious, on Edge 0 0 0 0  Control/stop worrying 0 0 0 0  Worry too much - different things 0 0 0 0  Trouble relaxing 0 0 0 0  Restless 0 0 0 0  Easily annoyed or irritable 0 0 0 0  Afraid - awful might happen 0 0 0 0  Total GAD 7 Score 0 0 0 0  Anxiety Difficulty Not difficult at all Not difficult at all Not difficult at all Not difficult at all      01/29/2024     9:11 AM 08/23/2023    8:23 AM 04/29/2023    9:53 AM  Depression screen PHQ 2/9  Decreased Interest 0 0 0  Down, Depressed, Hopeless 0 0 0  PHQ - 2 Score 0 0 0  Altered sleeping 0 0 0  Tired, decreased energy 0 1 0  Change in appetite 0 0 0  Feeling bad or failure about yourself  0 0 0  Trouble concentrating 0 0 0  Moving slowly or fidgety/restless 0 0 0  Suicidal thoughts 0 0 0  PHQ-9 Score 0 1 0  Difficult doing work/chores Not difficult at all Not difficult at all Not difficult at all      01/29/2024    9:11 AM 08/23/2023    8:23 AM 04/29/2023    9:53 AM 04/11/2023    9:12 AM  GAD 7 : Generalized Anxiety Score  Nervous, Anxious, on Edge 0 0 0 0  Control/stop worrying 0 0 0 0  Worry too much - different things 0 0 0 0  Trouble relaxing 0 0 0 0  Restless 0 0 0 0  Easily annoyed or irritable 0 0 0 0  Afraid - awful might happen 0 0 0 0  Total GAD 7 Score 0 0 0 0  Anxiety Difficulty Not difficult at all Not difficult at all Not difficult at all Not difficult at all   SDOH Screenings   Food Insecurity: No Food Insecurity (04/10/2023)  Housing: Unknown (04/10/2023)  Transportation Needs: No Transportation Needs (04/10/2023)  Depression (PHQ2-9): Low Risk  (01/29/2024)  Financial Resource Strain: Low Risk  (04/10/2023)  Physical Activity: Insufficiently Active (04/10/2023)  Social Connections: Moderately Integrated (04/10/2023)  Stress: No Stress Concern Present (04/10/2023)  Tobacco Use: Low Risk  (01/29/2024)     Physical Exam Constitutional:      General: She is not in acute distress.    Appearance: Normal appearance.  HENT:     Head: Normocephalic and atraumatic.     Right Ear: Tympanic membrane normal.     Left Ear: Tympanic membrane normal.     Mouth/Throat:     Mouth: Mucous membranes are moist.  Neck:     Thyroid : No thyroid  mass or thyroid  tenderness.  Cardiovascular:     Rate and Rhythm: Normal rate and regular rhythm.  Pulmonary:     Effort: Pulmonary effort is  normal.     Breath sounds: Normal breath sounds.  Abdominal:     General: Bowel sounds are normal.     Palpations: Abdomen is soft.     Tenderness: There is no guarding.  Musculoskeletal:     Right shoulder: No  bony tenderness or crepitus. Normal strength. Normal pulse.     Left shoulder: No bony tenderness or crepitus. Normal strength. Normal pulse.     Cervical back: Neck supple. No rigidity.     Right lower leg: Edema (1+pitting edema with varicose vein noted) present.     Left lower leg: Edema (1+pitting edema with varicose vein noted) present.  Lymphadenopathy:     Cervical: No cervical adenopathy.  Skin:    General: Skin is warm.  Neurological:     Mental Status: She is alert and oriented to person, place, and time.  Psychiatric:        Mood and Affect: Mood normal.        Behavior: Behavior normal.        No results found for any visits on 01/29/24.  The 10-year ASCVD risk score (Arnett DK, et al., 2019) is: 1.3%     Assessment & Plan:   Assessment & Plan Primary hypertension - Hypertension controlled with amlodipine  5 mg daily. Continue. Check CMP  Report any worsening of leg swelling.  Orders:   amLODipine  (NORVASC ) 5 MG tablet; Take 1 tablet (5 mg total) by mouth daily.   Comp Met (CMET)  Mixed hyperlipidemia - Chronic, stable on Pravastatin  40 mg daily. Continue. Reviewed lipid panel from 07/2023.  Orders:   pravastatin  (PRAVACHOL ) 40 MG tablet; Take 1 tablet (40 mg total) by mouth daily.   Comp Met (CMET)  High vitamin D  level - Previously elevated vitamin D  levels. Not currently taking supplements. Ordered repeat vitamin D  level. Orders:   Vitamin D  (25 hydroxy)  Prediabetes - Previous A1c in prediabetic range, recent A1c normal. Repeat A1c. Encouraged dietary modifications to reduce carbohydrate intake, regular exercise to reduce risk of progression.  Orders:   HgB A1c  Chronic right shoulder pain - Intermittent pain. No recent injury, but  remote injury about 20 years ago. Recommended avoiding repetitive motions that exacerbate pain. Apply Voltaren gel 1% (diclofenac) up to four times a day as needed for pain. Will consider x-ray and physical therapy if pain persists or worsens.    Gastroesophageal reflux disease, unspecified whether esophagitis present - GERD well-controlled with Prilosec 20 mg daily, continue. Refill sent. Monitor symptoms and consider endoscopy if symptoms worsen.    Non-seasonal allergic rhinitis, unspecified trigger Symptoms controlled with Singulair  10 mg  and Xyzal 5 mg daily.  Discussed Flonase  for exacerbations.    Asymptomatic varicose veins of both lower extremities Bilateral 1+ pitting edema with varicose veins. Swelling likely due to varicose veins, dependent edema.  Recommended wearing 25 mmHg compression stockings during the day, raise legs when possible.  Monitor for worsening swelling and report if occurs.     Return in about 6 months (around 07/28/2024), or Chronic follow up.   Luke Shade, MD

## 2024-01-29 NOTE — Assessment & Plan Note (Addendum)
-   GERD well-controlled with Prilosec 20 mg daily, continue. Refill sent. Monitor symptoms and consider endoscopy if symptoms worsen.

## 2024-01-29 NOTE — Assessment & Plan Note (Addendum)
-   Previous A1c in prediabetic range, recent A1c normal. Repeat A1c. Encouraged dietary modifications to reduce carbohydrate intake, regular exercise to reduce risk of progression.  Orders:   HgB A1c

## 2024-01-29 NOTE — Assessment & Plan Note (Signed)
 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 Swelling likely due to varicose veins, dependent edema.  Recommended wearing 25 mmHg compression stockings during the day, raise legs when possible.  Monitor for worsening swelling and report if occurs.

## 2024-01-29 NOTE — Patient Instructions (Addendum)
-   Diclofenac 1% over the counter up to 4 times a day as needed for shoulder pain, ice it, avoid repetitive pain inducing motions. Let us  know if pain persists.   - For varicose veins: Leg elevation when you can, maintain a healthy weight management, and regular exercise.Compression Therapy: Recommend 25 mmHg compression stockings.

## 2024-01-29 NOTE — Assessment & Plan Note (Addendum)
-   Intermittent pain. No recent injury, but remote injury about 20 years ago. Recommended avoiding repetitive motions that exacerbate pain. Apply Voltaren gel 1% (diclofenac) up to four times a day as needed for pain. Will consider x-ray and physical therapy if pain persists or worsens.

## 2024-01-30 ENCOUNTER — Ambulatory Visit: Payer: Self-pay

## 2024-01-30 LAB — VITAMIN D 25 HYDROXY (VIT D DEFICIENCY, FRACTURES): VITD: 67.02 ng/mL (ref 30.00–100.00)

## 2024-02-03 DIAGNOSIS — H02403 Unspecified ptosis of bilateral eyelids: Secondary | ICD-10-CM | POA: Diagnosis not present

## 2024-02-03 DIAGNOSIS — H02839 Dermatochalasis of unspecified eye, unspecified eyelid: Secondary | ICD-10-CM | POA: Diagnosis not present

## 2024-02-03 DIAGNOSIS — H5340 Unspecified visual field defects: Secondary | ICD-10-CM | POA: Diagnosis not present

## 2024-02-11 DIAGNOSIS — F5089 Other specified eating disorder: Secondary | ICD-10-CM | POA: Diagnosis not present

## 2024-02-27 ENCOUNTER — Other Ambulatory Visit: Payer: Self-pay

## 2024-02-27 MED FILL — Montelukast Sodium Tab 10 MG (Base Equiv): ORAL | 30 days supply | Qty: 30 | Fill #1 | Status: AC

## 2024-03-30 ENCOUNTER — Other Ambulatory Visit: Payer: Self-pay

## 2024-03-30 DIAGNOSIS — J309 Allergic rhinitis, unspecified: Secondary | ICD-10-CM

## 2024-03-30 MED ORDER — MONTELUKAST SODIUM 10 MG PO TABS
10.0000 mg | ORAL_TABLET | Freq: Every day | ORAL | 3 refills | Status: AC
  Filled 2024-03-30 – 2024-03-31 (×2): qty 30, 30d supply, fill #0
  Filled 2024-04-30: qty 90, 90d supply, fill #1

## 2024-03-30 MED FILL — Montelukast Sodium Tab 10 MG (Base Equiv): ORAL | 30 days supply | Qty: 30 | Fill #2 | Status: CN

## 2024-03-31 ENCOUNTER — Other Ambulatory Visit: Payer: Self-pay

## 2024-03-31 ENCOUNTER — Other Ambulatory Visit (HOSPITAL_COMMUNITY): Payer: Self-pay

## 2024-03-31 ENCOUNTER — Encounter: Payer: Self-pay | Admitting: Pharmacist

## 2024-04-01 ENCOUNTER — Other Ambulatory Visit: Payer: Self-pay

## 2024-04-06 ENCOUNTER — Encounter: Payer: Self-pay | Admitting: *Deleted

## 2024-04-11 ENCOUNTER — Other Ambulatory Visit (HOSPITAL_COMMUNITY): Payer: Self-pay

## 2024-04-13 ENCOUNTER — Other Ambulatory Visit: Payer: Self-pay

## 2024-04-30 ENCOUNTER — Other Ambulatory Visit: Payer: Self-pay

## 2024-04-30 ENCOUNTER — Other Ambulatory Visit (HOSPITAL_BASED_OUTPATIENT_CLINIC_OR_DEPARTMENT_OTHER): Payer: Self-pay

## 2024-07-29 ENCOUNTER — Ambulatory Visit
